# Patient Record
Sex: Male | Born: 1940 | Race: Black or African American | Hispanic: No | Marital: Married | Smoking: Current every day smoker
Health system: Southern US, Community
[De-identification: ages and names within clinical notes are randomized; demographics above are authoritative.]

## PROBLEM LIST (undated history)

## (undated) DIAGNOSIS — I639 Cerebral infarction, unspecified: Secondary | ICD-10-CM

## (undated) DIAGNOSIS — C801 Malignant (primary) neoplasm, unspecified: Secondary | ICD-10-CM

## (undated) HISTORY — PX: HERNIA REPAIR: SHX51

## (undated) HISTORY — DX: Cerebral infarction, unspecified: I63.9

## (undated) HISTORY — PX: GASTRECTOMY: SHX58

---

## 2017-06-27 ENCOUNTER — Emergency Department: Payer: Medicare Other

## 2017-06-27 ENCOUNTER — Emergency Department
Admission: EM | Admit: 2017-06-27 | Discharge: 2017-06-27 | Disposition: A | Discharge status: Home / Self Care | Payer: Medicare Other | Attending: Emergency Medicine | Admitting: Emergency Medicine

## 2017-06-27 ENCOUNTER — Encounter: Payer: Self-pay | Admitting: Emergency Medicine

## 2017-06-27 DIAGNOSIS — Z85038 Personal history of other malignant neoplasm of large intestine: Secondary | ICD-10-CM | POA: Diagnosis not present

## 2017-06-27 DIAGNOSIS — Z8546 Personal history of malignant neoplasm of prostate: Secondary | ICD-10-CM | POA: Diagnosis not present

## 2017-06-27 DIAGNOSIS — I6523 Occlusion and stenosis of bilateral carotid arteries: Secondary | ICD-10-CM | POA: Insufficient documentation

## 2017-06-27 DIAGNOSIS — R42 Dizziness and giddiness: Secondary | ICD-10-CM | POA: Diagnosis present

## 2017-06-27 DIAGNOSIS — F17210 Nicotine dependence, cigarettes, uncomplicated: Secondary | ICD-10-CM | POA: Diagnosis not present

## 2017-06-27 DIAGNOSIS — H81392 Other peripheral vertigo, left ear: Secondary | ICD-10-CM | POA: Insufficient documentation

## 2017-06-27 HISTORY — DX: Malignant (primary) neoplasm, unspecified: C80.1

## 2017-06-27 LAB — BASIC METABOLIC PANEL
Anion gap: 6 (ref 5–15)
BUN: 16 mg/dL (ref 6–20)
CHLORIDE: 108 mmol/L (ref 101–111)
CO2: 28 mmol/L (ref 22–32)
Calcium: 9.4 mg/dL (ref 8.9–10.3)
Creatinine, Ser: 1.14 mg/dL (ref 0.61–1.24)
GFR calc Af Amer: >60 mL/min (ref >60)
GFR calc non Af Amer: >60 mL/min (ref >60)
Glucose, Bld: 129 mg/dL — ABNORMAL HIGH (ref 65–99)
POTASSIUM: 4.1 mmol/L (ref 3.5–5.1)
SODIUM: 142 mmol/L (ref 135–145)

## 2017-06-27 LAB — CBC
HEMATOCRIT: 38.1 % — AB (ref 40.0–52.0)
Hemoglobin: 12.7 g/dL — ABNORMAL LOW (ref 13.0–18.0)
MCH: 33.7 pg (ref 26.0–34.0)
MCHC: 33.4 g/dL (ref 32.0–36.0)
MCV: 101 fL — AB (ref 80.0–100.0)
Platelets: 189 10*3/uL (ref 150–440)
RBC: 3.77 MIL/uL — AB (ref 4.40–5.90)
RDW: 16.9 % — AB (ref 11.5–14.5)
WBC: 7.3 10*3/uL (ref 3.8–10.6)

## 2017-06-27 LAB — TROPONIN I: Troponin I: <0.03 ng/mL (ref <0.03)

## 2017-06-27 MED ORDER — GADOBENATE DIMEGLUMINE 529 MG/ML IV SOLN
20.0000 mL | Freq: Once | INTRAVENOUS | Status: AC | PRN
  Administered 2017-06-27: 20 mL via INTRAVENOUS

## 2017-06-27 MED ORDER — MECLIZINE HCL 25 MG PO TABS
50.0000 mg | ORAL_TABLET | Freq: Once | ORAL | Status: AC
  Administered 2017-06-27: 50 mg via ORAL
  Filled 2017-06-27: qty 2

## 2017-06-27 MED ORDER — MECLIZINE HCL 25 MG PO TABS: 25.0000 mg | ORAL_TABLET | Freq: Three times a day (TID) | ORAL | 0 refills | Status: DC | PRN

## 2017-06-27 NOTE — ED Triage Notes (Signed)
Patient brought un from home by ems. Patient states that he felt dizzy when waking up at 03:00.

## 2017-06-27 NOTE — ED Provider Notes (Signed)
Care signed over from Dr. Owens Shark pending results of MRA. Briefly the patient is a 77 year old man who comes to the emergency department with acute onset severe room spinning vertigo. Worse with movement and improved with rest. The vertigo is fatigable. He has no other neurological symptoms. MRA shows multiple chronic stenoses, however nothing acute or critical. On my exam she has left beating nystagmus and an otherwise normal exam which is all consistent with benign paroxysmal positional vertigo. I will treat him with meclizine and instructions for the Epley maneuver as well as otolaryngology outpatient.   Darel Hong, MD 06/27/17 [phone]

## 2017-06-27 NOTE — ED Notes (Signed)
Pt back from MRI, no complaints of pain. Still co slight dizziness, no deficits noted.

## 2017-06-27 NOTE — Discharge Instructions (Signed)
Fortunately today your MRI was reassuring and you did not have a stroke. You do have peripheral vertigo which is frustrating and can be difficult to treat. Please take your meclizine 3 times a day as needed for vertigo symptoms and performing your Epley maneuver at home 4-5 times a day. Please make an appointment to follow-up with otolaryngology in the next week or so if your symptoms do not improve and return to the emergency department for any concerns.  It was a pleasure to take care of you today, and thank you for coming to our emergency department.  If you have any questions or concerns before leaving please ask the nurse to grab me and I'm more than happy to go through your aftercare instructions again.  If you were prescribed any opioid pain medication today such as Norco, Vicodin, Percocet, morphine, hydrocodone, or oxycodone please make sure you do not drive when you are taking this medication as it can alter your ability to drive safely.  If you have any concerns once you are home that you are not improving or are in fact getting worse before you can make it to your follow-up appointment, please do not hesitate to call 911 and come back for further evaluation.  Darel Hong, MD  Results for orders placed or performed during the hospital encounter of 92/42/68  Basic metabolic panel  Result Value Ref Range   Sodium 142 135 - 145 mmol/L   Potassium 4.1 3.5 - 5.1 mmol/L   Chloride 108 101 - 111 mmol/L   CO2 28 22 - 32 mmol/L   Glucose, Bld 129 (H) 65 - 99 mg/dL   BUN 16 6 - 20 mg/dL   Creatinine, Ser 1.14 0.61 - 1.24 mg/dL   Calcium 9.4 8.9 - 10.3 mg/dL   GFR calc non Af Amer >60 >60 mL/min   GFR calc Af Amer >60 >60 mL/min   Anion gap 6 5 - 15  CBC  Result Value Ref Range   WBC 7.3 3.8 - 10.6 K/uL   RBC 3.77 (L) 4.40 - 5.90 MIL/uL   Hemoglobin 12.7 (L) 13.0 - 18.0 g/dL   HCT 38.1 (L) 40.0 - 52.0 %   MCV 101.0 (H) 80.0 - 100.0 fL   MCH 33.7 26.0 - 34.0 pg   MCHC 33.4 32.0 -  36.0 g/dL   RDW 16.9 (H) 11.5 - 14.5 %   Platelets 189 150 - 440 K/uL  Troponin I  Result Value Ref Range   Troponin I <0.03 <0.03 ng/mL   Ct Head Wo Contrast  Result Date: 06/27/2017 CLINICAL DATA:  Dizziness. EXAM: CT HEAD WITHOUT CONTRAST TECHNIQUE: Contiguous axial images were obtained from the base of the skull through the vertex without intravenous contrast. COMPARISON:  None. FINDINGS: Brain: No evidence of acute infarction, hemorrhage, hydrocephalus, extra-axial collection or mass lesion/mass effect. Mild diffuse cerebral atrophy. Vascular: Vascular calcifications are present in the internal carotid arteries. Skull: No depressed skull fractures. Sinuses/Orbits: Probable retention cysts in the left maxillary antrum. No acute air-fluid levels. Mastoid air cells are not opacified. Other: None. IMPRESSION: No acute intracranial abnormalities.  Chronic atrophy. Electronically Signed   By: Lucienne Capers M.D.   On: 06/27/2017 05:26   Mr Angiogram Head Wo Contrast  Result Date: 06/27/2017 CLINICAL DATA:  Initial evaluation for acute dizziness. EXAM: MRA HEAD WITHOUT CONTRAST MRA NECK WITHOUT AND WITH CONTRAST TECHNIQUE: Multiplanar, multiecho pulse sequences of the brain and surrounding structures were obtained without intravenous contrast. Angiographic images of the  Circle of Willis were obtained using MRA technique without intravenous contrast. Angiographic images of the neck were obtained using MRA technique without and with intravenous contrast. Carotid stenosis measurements (when applicable) are obtained utilizing NASCET criteria, using the distal internal carotid diameter as the denominator. CONTRAST:  14mL MULTIHANCE GADOBENATE DIMEGLUMINE 529 MG/ML IV SOLN COMPARISON:  Prior head CT from earlier the same day. FINDINGS: MRI HEAD FINDINGS MRA HEAD FINDINGS ANTERIOR CIRCULATION: Study degraded by motion artifact. Visualized distal cervical segments of the internal carotid arteries are patent with  antegrade flow. Petrous segments patent bilaterally without stenosis. Scattered atheromatous irregularity within the cavernous/supraclinoid ICAs without flow-limiting stenosis. ICA termini widely patent. Probable atheromatous irregularity within the A1 segments bilaterally without high-grade stenosis. Right A1 segment hypoplastic. Left A1 segment dominant. Anterior communicating artery grossly normal. Anterior cerebral artery is grossly patent to their distal aspects without obvious stenosis. M1 segments patent without occlusion or obvious flow-limiting stenosis. No proximal M2 occlusion. Distal MCA branches limited evaluation due to motion, but are well opacified and grossly symmetric. POSTERIOR CIRCULATION: Dominant left vertebral artery with hypoplastic right vertebral artery. Vertebral artery is are patent to the vertebrobasilar junction without obvious stenosis. Partially visualized posterior inferior cerebral arteries patent. Basilar artery diminutive but patent to its distal aspect. Superior cerebral arteries patent bilaterally. Predominant fetal type PCAs supplied via widely patent posterior communicating arteries. PCAs are widely patent through their mid aspects, not well evaluated distally due to motion. No obvious aneurysm or vascular abnormality. MRA NECK FINDINGS Source images reviewed. Origin of the vertebral arteries not visualized on this exam. Partially visualized subclavian arteries widely patent. Right common carotid artery widely patent from its origin to the bifurcation. Mild atheromatous narrowing of approximately 25-30% present about the right carotid bifurcation. Right ICA patent distally to the skullbase without stenosis or occlusion. Visualized left common carotid artery widely patent to the bifurcation. Atheromatous irregularity with moderate stenosis at the left carotid bifurcation/ proximal left ICA. Superimposed 2 adjacent focal outpouchings extending from the proximal left ICA likely  reflect an known penetrating atheromatous ulcers (series 12, image 46). Left ICA patent distally without stenosis or occlusion. Vertebral arteries not well evaluated on this exam due to motion and timing. Left vertebral artery dominant and grossly patent to the skullbase. Right vertebral artery diffusely hypoplastic and not well visualized. IMPRESSION: MRA HEAD IMPRESSION: 1. Motion degraded exam. 2. Negative MRA for large vessel occlusion. No obvious high-grade or correctable stenosis. 3. Fetal type origin of the PCAs with diminutive vertebrobasilar system. MRA NECK IMPRESSION: 1. Motion degraded exam with poor evaluation of the vertebral artery's. Dominant left vertebral artery grossly patent within the neck without obvious stenosis. Right vertebral artery diffusely hypoplastic and not well evaluated. 2. 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 Follow-up examination with dedicated CTA would likely be helpful for complete evaluation of this finding. Otherwise patent left carotid artery system. 3. Mild atheromatous stenosis about the right carotid bifurcation/proximal right ICA. Otherwise widely patent right carotid artery system. Electronically Signed   By: Jeannine Boga M.D.   On: 06/27/2017 06:59   Mr Angiogram Neck W Or Wo Contrast  Result Date: 06/27/2017 CLINICAL DATA:  Initial evaluation for acute dizziness. EXAM: MRA HEAD WITHOUT CONTRAST MRA NECK WITHOUT AND WITH CONTRAST TECHNIQUE: Multiplanar, multiecho pulse sequences of the brain and surrounding structures were obtained without intravenous contrast. Angiographic images of the Circle of Willis were obtained using MRA technique without intravenous  contrast. Angiographic images of the neck were obtained using MRA technique without and with intravenous contrast. Carotid stenosis measurements (when applicable) are obtained utilizing NASCET  criteria, using the distal internal carotid diameter as the denominator. CONTRAST:  29mL MULTIHANCE GADOBENATE DIMEGLUMINE 529 MG/ML IV SOLN COMPARISON:  Prior head CT from earlier the same day. FINDINGS: MRI HEAD FINDINGS MRA HEAD FINDINGS ANTERIOR CIRCULATION: Study degraded by motion artifact. Visualized distal cervical segments of the internal carotid arteries are patent with antegrade flow. Petrous segments patent bilaterally without stenosis. Scattered atheromatous irregularity within the cavernous/supraclinoid ICAs without flow-limiting stenosis. ICA termini widely patent. Probable atheromatous irregularity within the A1 segments bilaterally without high-grade stenosis. Right A1 segment hypoplastic. Left A1 segment dominant. Anterior communicating artery grossly normal. Anterior cerebral artery is grossly patent to their distal aspects without obvious stenosis. M1 segments patent without occlusion or obvious flow-limiting stenosis. No proximal M2 occlusion. Distal MCA branches limited evaluation due to motion, but are well opacified and grossly symmetric. POSTERIOR CIRCULATION: Dominant left vertebral artery with hypoplastic right vertebral artery. Vertebral artery is are patent to the vertebrobasilar junction without obvious stenosis. Partially visualized posterior inferior cerebral arteries patent. Basilar artery diminutive but patent to its distal aspect. Superior cerebral arteries patent bilaterally. Predominant fetal type PCAs supplied via widely patent posterior communicating arteries. PCAs are widely patent through their mid aspects, not well evaluated distally due to motion. No obvious aneurysm or vascular abnormality. MRA NECK FINDINGS Source images reviewed. Origin of the vertebral arteries not visualized on this exam. Partially visualized subclavian arteries widely patent. Right common carotid artery widely patent from its origin to the bifurcation. Mild atheromatous narrowing of approximately  25-30% present about the right carotid bifurcation. Right ICA patent distally to the skullbase without stenosis or occlusion. Visualized left common carotid artery widely patent to the bifurcation. Atheromatous irregularity with moderate stenosis at the left carotid bifurcation/ proximal left ICA. 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 Left ICA patent distally without stenosis or occlusion. Vertebral arteries not well evaluated on this exam due to motion and timing. Left vertebral artery dominant and grossly patent to the skullbase. Right vertebral artery diffusely hypoplastic and not well visualized. IMPRESSION: MRA HEAD IMPRESSION: 1. Motion degraded exam. 2. Negative MRA for large vessel occlusion. No obvious high-grade or correctable stenosis. 3. Fetal type origin of the PCAs with diminutive vertebrobasilar system. MRA NECK IMPRESSION: 1. Motion degraded exam with poor evaluation of the vertebral artery's. Dominant left vertebral artery grossly patent within the neck without obvious stenosis. Right vertebral artery diffusely hypoplastic and not well evaluated. 2. 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 Follow-up examination with dedicated CTA would likely be helpful for complete evaluation of this finding. Otherwise patent left carotid artery system. 3. Mild atheromatous stenosis about the right carotid bifurcation/proximal right ICA. Otherwise widely patent right carotid artery system. Electronically Signed   By: Jeannine Boga M.D.   On: 06/27/2017 06:59

## 2017-06-27 NOTE — ED Notes (Signed)
Patient transported to CT 

## 2017-06-27 NOTE — ED Notes (Signed)
Patient transported to MRI 

## 2017-06-27 NOTE — ED Provider Notes (Signed)
[facility] Emergency Department Provider Note   First MD Initiated Contact with Patient 06/27/17 [phone]     (approximate)  I have reviewed the triage vital signs and the nursing notes.   HISTORY  Chief Complaint Dizziness    HPI Bradley Rivera is a 76 y.o. male with below list of chronic medical conditions presents to the emergency department with acute onset of dizziness on awakening this morning. Patient denies any headache no weakness numbness. Patient missed some nausea but no vomiting. Patient denies any chest pain or shortness of breath. Patient denies ever having symptoms like this before.  Past Medical History:  Diagnosis Date  . 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   Past Surgical History:  Procedure Laterality Date  . GASTRECTOMY    . HERNIA REPAIR      Prior to Admission medications   Not on File    Allergies Asa [aspirin]  No family history on file.  Social History Social History  Substance Use Topics  . Smoking status: Current Some Day Smoker  . Smokeless tobacco: Never Used  . Alcohol use Not on file    Review of Systems Constitutional: No fever/chills Eyes: No visual changes. ENT: No sore throat. Cardiovascular: Denies chest pain. Respiratory: Denies shortness of breath. Gastrointestinal: No abdominal pain.  No nausea, no vomiting.  No diarrhea.  No constipation. Genitourinary: Negative for dysuria. Musculoskeletal: Negative for neck pain.  Negative for back pain. Integumentary: Negative for rash. Neurological: Negative for headaches, focal weakness or numbness.Positive for dizziness ____________________________________________   PHYSICAL EXAM:  VITAL SIGNS: ED Triage Vitals [06/27/17 0407]  Enc Vitals Group     BP (!) 173/66     Pulse Rate 62     Resp 18     Temp 97.8 F (36.6 C)     Temp Source Oral     SpO2 99 %     Weight 77.1 kg  (170 lb)     Height 1.854 m (6\' 1" )     Head Circumference      Peak Flow      Pain Score      Pain Loc      Pain Edu?      Excl. in [place]?      Constitutional: Alert and oriented. Well appearing and in no acute distress. Eyes: Conjunctivae are normal. PERRL. EOMI. Head: Atraumatic. Ears:  Healthy appearing ear canals and TMs bilaterally Nose: No congestion/rhinnorhea. Mouth/Throat: Mucous membranes are moist  Oropharynx non-erythematous. Neck: No stridor.   Cardiovascular: Normal rate, regular rhythm. Good peripheral circulation. Grossly normal heart sounds. Respiratory: Normal respiratory effort.  No retractions. Lungs CTAB. Gastrointestinal: Soft and nontender. No distention.  Musculoskeletal: No lower extremity tenderness nor edema. No gross deformities of extremities. Neurologic:  Normal speech and language. No gross focal neurologic deficits are appreciated.  Skin:  Skin is warm, dry and intact. No rash noted. Psychiatric: Mood and affect are normal. Speech and behavior are normal.  ____________________________________________   LABS (all labs ordered are listed, but only abnormal results are displayed)  Labs Reviewed  BASIC METABOLIC PANEL - Abnormal; Notable for the following:       Result Value   Glucose, Bld 129 (*)    All other components within normal limits  CBC - Abnormal; Notable for the following:    RBC 3.77 (*)    Hemoglobin 12.7 (*)  HCT 38.1 (*)    MCV 101.0 (*)    RDW 16.9 (*)    All other components within normal limits  TROPONIN I   ____________________________________________  EKG  ED ECG REPORT I, [place] [address], the attending physician, personally viewed and interpreted this ECG.   Date: 06/27/2017  EKG Time: 4:05 AM  Rate: 60  Rhythm: Normal sinus rhythm  Axis: Normal  Intervals: Normal  ST&T Change: None  ____________________________________________  RADIOLOGY I, [place], personally viewed and evaluated these images  (plain radiographs) as part of my medical decision making, as well as reviewing the written report by the radiologist.  Ct Head Wo Contrast  Result Date: 06/27/2017 CLINICAL DATA:  Dizziness. EXAM: CT HEAD WITHOUT CONTRAST TECHNIQUE: Contiguous axial images were obtained from the base of the skull through the vertex without intravenous contrast. COMPARISON:  None. FINDINGS: Brain: No evidence of acute infarction, hemorrhage, hydrocephalus, extra-axial collection or mass lesion/mass effect. Mild diffuse cerebral atrophy. Vascular: Vascular calcifications are present in the internal carotid arteries. Skull: No depressed skull fractures. Sinuses/Orbits: Probable retention cysts in the left maxillary antrum. No acute air-fluid levels. Mastoid air cells are not opacified. Other: None. IMPRESSION: No acute intracranial abnormalities.  Chronic atrophy. Electronically Signed   By: Lucienne Capers M.D.   On: 06/27/2017 05:26     Procedures   ____________________________________________   INITIAL IMPRESSION / ASSESSMENT AND PLAN / ED COURSE  Pertinent labs & imaging results that were available during my care of the patient were reviewed by me and considered in my medical decision making (see chart for details).  76 year old male presenting in the emergency department with acute onset of dizziness on awakening this morning. CT scan of the head revealed no acute intracranial abnormalities laboratory data unremarkable. Plan to perform an MRI of the patient's brain and neck to evaluate for posterior circulation pathology, aneurysm or CVA. If MRI negative I  anticipate that the patient will be able to be discharged home with outpatient follow-up. Patient's care transferred to Dr. Mable Paris      ____________________________________________  FINAL CLINICAL IMPRESSION(S) / ED DIAGNOSES  Final diagnoses:  Dizziness     MEDICATIONS GIVEN DURING THIS VISIT:  Medications  gadobenate dimeglumine  (MULTIHANCE) injection 20 mL (not administered)     NEW OUTPATIENT MEDICATIONS STARTED DURING THIS VISIT:  New Prescriptions   No medications on file    Modified Medications   No medications on file    Discontinued Medications   No medications on file     Note:  This document was prepared using Dragon voice recognition software and may include unintentional dictation errors.    Gregor Hams, MD 06/27/17 [phone]

## 2017-06-27 NOTE — ED Notes (Signed)
ED Provider at bedside. 

## 2017-07-16 ENCOUNTER — Telehealth: Payer: Self-pay | Admitting: Emergency Medicine

## 2017-07-16 NOTE — Telephone Encounter (Signed)
Patient called asking about follow up appointment.  I explained that we did not make an appoinment, but that it was ent he needed to follow up with if he was not better.  Patient says he is better. No longer taking the meclizine.  He has pcp at [place].  I told him to call the pcp and let them know about ED visit, as he had many tests done that they would want access to.  I also told him to ask if he really needed to go to ent since he is better. He agrees.

## 2019-03-28 ENCOUNTER — Ambulatory Visit: Payer: Self-pay | Admitting: Internal Medicine

## 2019-03-28 ENCOUNTER — Other Ambulatory Visit: Payer: Self-pay

## 2019-05-02 ENCOUNTER — Other Ambulatory Visit: Payer: Self-pay

## 2019-05-02 ENCOUNTER — Encounter: Payer: Self-pay | Admitting: Emergency Medicine

## 2019-05-02 ENCOUNTER — Emergency Department
Admission: EM | Admit: 2019-05-02 | Discharge: 2019-05-02 | Disposition: A | Discharge status: Home / Self Care | Payer: Medicare HMO | Attending: Emergency Medicine | Admitting: Emergency Medicine

## 2019-05-02 DIAGNOSIS — F17200 Nicotine dependence, unspecified, uncomplicated: Secondary | ICD-10-CM | POA: Insufficient documentation

## 2019-05-02 DIAGNOSIS — Z85038 Personal history of other malignant neoplasm of large intestine: Secondary | ICD-10-CM | POA: Diagnosis not present

## 2019-05-02 DIAGNOSIS — R42 Dizziness and giddiness: Secondary | ICD-10-CM | POA: Diagnosis present

## 2019-05-02 DIAGNOSIS — Z8546 Personal history of malignant neoplasm of prostate: Secondary | ICD-10-CM | POA: Diagnosis not present

## 2019-05-02 DIAGNOSIS — E860 Dehydration: Secondary | ICD-10-CM | POA: Diagnosis not present

## 2019-05-02 DIAGNOSIS — Z85028 Personal history of other malignant neoplasm of stomach: Secondary | ICD-10-CM | POA: Diagnosis not present

## 2019-05-02 DIAGNOSIS — E162 Hypoglycemia, unspecified: Secondary | ICD-10-CM | POA: Insufficient documentation

## 2019-05-02 LAB — CBC WITH DIFFERENTIAL/PLATELET
Abs Immature Granulocytes: 0 10*3/uL (ref 0.00–0.07)
Basophils Absolute: 0.1 10*3/uL (ref 0.0–0.1)
Basophils Relative: 1 %
Eosinophils Absolute: 0.1 10*3/uL (ref 0.0–0.5)
Eosinophils Relative: 2 %
HCT: 32 % — ABNORMAL LOW (ref 39.0–52.0)
Hemoglobin: 10.5 g/dL — ABNORMAL LOW (ref 13.0–17.0)
Immature Granulocytes: 0 %
Lymphocytes Relative: 41 %
Lymphs Abs: 2.9 10*3/uL (ref 0.7–4.0)
MCH: 32.4 pg (ref 26.0–34.0)
MCHC: 32.8 g/dL (ref 30.0–36.0)
MCV: 98.8 fL (ref 80.0–100.0)
Monocytes Absolute: 1 10*3/uL (ref 0.1–1.0)
Monocytes Relative: 14 %
Neutro Abs: 3 10*3/uL (ref 1.7–7.7)
Neutrophils Relative %: 42 %
Platelets: 196 10*3/uL (ref 150–400)
RBC: 3.24 MIL/uL — ABNORMAL LOW (ref 4.22–5.81)
RDW: 15.9 % — ABNORMAL HIGH (ref 11.5–15.5)
WBC: 7.1 10*3/uL (ref 4.0–10.5)
nRBC: 0 % (ref 0.0–0.2)

## 2019-05-02 LAB — GLUCOSE, CAPILLARY
Glucose-Capillary: 48 mg/dL — ABNORMAL LOW (ref 70–99)
Glucose-Capillary: 176 mg/dL — ABNORMAL HIGH (ref 70–99)
Glucose-Capillary: 199 mg/dL — ABNORMAL HIGH (ref 70–99)

## 2019-05-02 LAB — BASIC METABOLIC PANEL
Anion gap: 8 (ref 5–15)
BUN: 23 mg/dL (ref 8–23)
CO2: 21 mmol/L — ABNORMAL LOW (ref 22–32)
Calcium: 8.8 mg/dL — ABNORMAL LOW (ref 8.9–10.3)
Chloride: 110 mmol/L (ref 98–111)
Creatinine, Ser: 1.39 mg/dL — ABNORMAL HIGH (ref 0.61–1.24)
GFR calc Af Amer: 56 mL/min — ABNORMAL LOW (ref >60)
GFR calc non Af Amer: 48 mL/min — ABNORMAL LOW (ref >60)
Glucose, Bld: 49 mg/dL — ABNORMAL LOW (ref 70–99)
Potassium: 3.4 mmol/L — ABNORMAL LOW (ref 3.5–5.1)
Sodium: 139 mmol/L (ref 135–145)

## 2019-05-02 LAB — TROPONIN I: Troponin I: <0.03 ng/mL (ref <0.03)

## 2019-05-02 MED ORDER — DEXTROSE 50 % IV SOLN
INTRAVENOUS | Status: AC
  Administered 2019-05-02: 16:00:00 50 mL
  Filled 2019-05-02: qty 50

## 2019-05-02 NOTE — ED Triage Notes (Signed)
Pt here after working in yard this AM without drinking much per report. Went in house and started feeling dizzy and weak all over.  98 systolic per EMS

## 2019-05-02 NOTE — Discharge Instructions (Addendum)
Please seek medical attention for any high fevers, chest pain, shortness of breath, change in behavior, persistent vomiting, bloody stool or any other new or concerning symptoms.  

## 2019-05-02 NOTE — ED Provider Notes (Signed)
[facility] Emergency Department Provider Note   ____________________________________________   I have reviewed the triage vital signs and the nursing notes.   HISTORY  Chief Complaint Dizziness   History limited by: Not Limited   HPI Bradley Rivera is a 78 y.o. male who presents to the emergency department today after episode of feeling dizzy.  Patient states that he had been outside working for most of the day.  He does not think he drank or ate as much as he should have.  He denies any concurrent chest pain or palpitations with the dizziness.  Denies any nausea or vomiting recently.  Denies any fevers.  By the time my exam he had gotten some dextrose and stated that he was feeling better.    Records reviewed. Per medical record review patient has a history of cancer, gastrectomy  Past Medical History:  Diagnosis Date  . 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   Past Surgical History:  Procedure Laterality Date  . GASTRECTOMY    . HERNIA REPAIR      Prior to Admission medications   Not on File    Allergies Patient has no known allergies.  History reviewed. No pertinent family history.  Social History Social History   Tobacco Use  . Smoking status: Current Some Day Smoker  . Smokeless tobacco: Never Used  Substance Use Topics  . Alcohol use: Not on file  . Drug use: Not on file    Review of Systems Constitutional: No fever/chills Eyes: No visual changes. ENT: No sore throat. Cardiovascular: Denies chest pain. Respiratory: Denies shortness of breath. Gastrointestinal: No abdominal pain.  No nausea, no vomiting.  No diarrhea.   Genitourinary: Negative for dysuria. Musculoskeletal: Negative for back pain. Skin: Negative for rash. Neurological: Positive for dizziness ____________________________________________   PHYSICAL EXAM:  VITAL SIGNS: ED Triage  Vitals  Enc Vitals Group     BP 05/02/19 1553 (!) 111/56     Pulse Rate 05/02/19 1553 69     Resp 05/02/19 1553 17     Temp 05/02/19 1553 97.8 F (36.6 C)     Temp Source 05/02/19 1553 Oral     SpO2 05/02/19 1553 95 %     Weight 05/02/19 1549 160 lb (72.6 kg)     Height 05/02/19 1549 6\' 1"  (1.854 m)     Head Circumference --      Peak Flow --      Pain Score 05/02/19 1549 0   Constitutional: Alert and oriented.  Eyes: Conjunctivae are normal.  ENT      Head: Normocephalic and atraumatic.      Nose: No congestion/rhinnorhea.      Mouth/Throat: Mucous membranes are moist.      Neck: No stridor. Hematological/Lymphatic/Immunilogical: No cervical lymphadenopathy. Cardiovascular: Normal rate, regular rhythm.  No murmurs, rubs, or gallops.  Respiratory: Normal respiratory effort without tachypnea nor retractions. Breath sounds are clear and equal bilaterally. No wheezes/rales/rhonchi. Gastrointestinal: Soft and non tender. No rebound. No guarding.  Genitourinary: Deferred Musculoskeletal: Normal range of motion in all extremities. No lower extremity edema. Neurologic:  Normal speech and language. No gross focal neurologic deficits are appreciated.  Skin:  Skin is warm, dry and intact. No rash noted. Psychiatric: Mood and affect are normal. Speech and behavior are normal. Patient exhibits appropriate insight and judgment.  ____________________________________________    LABS (pertinent positives/negatives)  Trop <0.03 BMP  na 139, k 3.4, glu 49, cr 1.39 CBC wbc 7.1, hgb 10.5, plt 196  ____________________________________________   EKG  I, Nance Pear, attending physician, personally viewed and interpreted this EKG  EKG Time: 1554 Rate: 67 Rhythm: sinus rhythm Axis: normal Intervals: qtc 415 QRS: narrow, q waves v1 ST changes: no st elevation Impression: abnormal ekg   ____________________________________________     RADIOLOGY  None  ____________________________________________   PROCEDURES  Procedures  ____________________________________________   INITIAL IMPRESSION / ASSESSMENT AND PLAN / ED COURSE  Pertinent labs & imaging results that were available during my care of the patient were reviewed by me and considered in my medical decision making (see chart for details).   Patient presented to the emergency department today because of concerns for dizziness.  He had been outside working today.  Patient was found to be hypoglycemic upon arrival.  Was given dextrose and IV fluids.  He did improve significantly after treatment.  Blood work without concerning findings.  Given that the patient feels much improved do think likely secondary to dehydration and hypoglycemia.  Discussed this with the patient.  Discussed importance of hydration. ____________________________________________   FINAL CLINICAL IMPRESSION(S) / ED DIAGNOSES  Final diagnoses:  Dizziness  Dehydration  Hypoglycemia     Note: This dictation was prepared with Dragon dictation. Any transcriptional errors that result from this process are unintentional     Nance Pear, MD 05/02/19 [phone]

## 2019-05-02 NOTE — ED Notes (Signed)
RN called [facility] [employer] to transport patient home. Taxi service says they are picking up a patient to transport and then will be back to get patient. Patient wishes to wait outside, this RN gave patient some OJ, peanut butter, and crackers just in case he gets dizzy on way home. Patient insists on waiting outside for taxi

## 2019-05-02 NOTE — ED Notes (Signed)
Patient states he is feeling much better.

## 2020-08-05 ENCOUNTER — Encounter: Payer: Self-pay | Admitting: Emergency Medicine

## 2020-08-05 ENCOUNTER — Other Ambulatory Visit: Payer: Self-pay

## 2020-08-05 ENCOUNTER — Emergency Department
Admission: EM | Admit: 2020-08-05 | Discharge: 2020-08-05 | Disposition: A | Discharge status: Home / Self Care | Payer: Medicare PPO | Attending: Emergency Medicine | Admitting: Emergency Medicine

## 2020-08-05 DIAGNOSIS — Z5321 Procedure and treatment not carried out due to patient leaving prior to being seen by health care provider: Secondary | ICD-10-CM | POA: Insufficient documentation

## 2020-08-05 DIAGNOSIS — R1013 Epigastric pain: Secondary | ICD-10-CM | POA: Insufficient documentation

## 2020-08-05 NOTE — ED Notes (Signed)
Pt refusing all protocols at this time; requesting to get EMS to take him back home due to long wait and lack of symptoms at this time; explained to pt EMS does not provide transportation home that they are used for emergencies to bring pts to the hospital; pt taken to lobby to wait for exam room

## 2020-08-05 NOTE — ED Triage Notes (Signed)
Pt to triage via w/c with no distress noted; EMS brought pt in from home for sudden onset mid epigastric pain; pt denies any c/o at present

## 2021-01-12 ENCOUNTER — Emergency Department: Payer: Medicare PPO

## 2021-01-12 ENCOUNTER — Inpatient Hospital Stay
Admission: EM | Admit: 2021-01-12 | Discharge: 2021-01-15 | DRG: 063 | Disposition: A | Discharge status: Home Health | Payer: Medicare PPO | Attending: Internal Medicine | Admitting: Internal Medicine

## 2021-01-12 ENCOUNTER — Other Ambulatory Visit: Payer: Self-pay

## 2021-01-12 DIAGNOSIS — I639 Cerebral infarction, unspecified: Secondary | ICD-10-CM | POA: Diagnosis present

## 2021-01-12 DIAGNOSIS — R54 Age-related physical debility: Secondary | ICD-10-CM | POA: Diagnosis present

## 2021-01-12 DIAGNOSIS — R471 Dysarthria and anarthria: Secondary | ICD-10-CM | POA: Diagnosis present

## 2021-01-12 DIAGNOSIS — Z20822 Contact with and (suspected) exposure to covid-19: Secondary | ICD-10-CM | POA: Diagnosis present

## 2021-01-12 DIAGNOSIS — R29704 NIHSS score 4: Secondary | ICD-10-CM | POA: Diagnosis present

## 2021-01-12 DIAGNOSIS — Z79899 Other long term (current) drug therapy: Secondary | ICD-10-CM

## 2021-01-12 DIAGNOSIS — N400 Enlarged prostate without lower urinary tract symptoms: Secondary | ICD-10-CM | POA: Diagnosis present

## 2021-01-12 DIAGNOSIS — F17200 Nicotine dependence, unspecified, uncomplicated: Secondary | ICD-10-CM | POA: Diagnosis present

## 2021-01-12 DIAGNOSIS — R42 Dizziness and giddiness: Secondary | ICD-10-CM | POA: Diagnosis present

## 2021-01-12 DIAGNOSIS — G8321 Monoplegia of upper limb affecting right dominant side: Secondary | ICD-10-CM | POA: Diagnosis present

## 2021-01-12 DIAGNOSIS — R29810 Facial weakness: Secondary | ICD-10-CM | POA: Diagnosis present

## 2021-01-12 DIAGNOSIS — R4781 Slurred speech: Secondary | ICD-10-CM | POA: Diagnosis present

## 2021-01-12 DIAGNOSIS — Z8546 Personal history of malignant neoplasm of prostate: Secondary | ICD-10-CM

## 2021-01-12 DIAGNOSIS — D649 Anemia, unspecified: Secondary | ICD-10-CM | POA: Diagnosis present

## 2021-01-12 DIAGNOSIS — I63432 Cerebral infarction due to embolism of left posterior cerebral artery: Principal | ICD-10-CM | POA: Diagnosis present

## 2021-01-12 NOTE — ED Triage Notes (Signed)
2230 onset dizziness and "discombobulation." presents with slight R sided weakness in upper extremity, slurred speech, and R sided facial droop.

## 2021-01-12 NOTE — ED Notes (Signed)
Teleneuro on camera in room conducting assessment

## 2021-01-13 ENCOUNTER — Inpatient Hospital Stay: Payer: Medicare PPO

## 2021-01-13 ENCOUNTER — Emergency Department: Payer: Medicare PPO

## 2021-01-13 ENCOUNTER — Inpatient Hospital Stay
Admit: 2021-01-13 | Discharge: 2021-01-13 | Disposition: A | Discharge status: Home / Self Care | Payer: Medicare PPO | Attending: Critical Care Medicine | Admitting: Critical Care Medicine

## 2021-01-13 DIAGNOSIS — N400 Enlarged prostate without lower urinary tract symptoms: Secondary | ICD-10-CM | POA: Diagnosis present

## 2021-01-13 DIAGNOSIS — I63432 Cerebral infarction due to embolism of left posterior cerebral artery: Secondary | ICD-10-CM | POA: Diagnosis present

## 2021-01-13 DIAGNOSIS — R471 Dysarthria and anarthria: Secondary | ICD-10-CM | POA: Diagnosis present

## 2021-01-13 DIAGNOSIS — R29810 Facial weakness: Secondary | ICD-10-CM | POA: Diagnosis present

## 2021-01-13 DIAGNOSIS — Z79899 Other long term (current) drug therapy: Secondary | ICD-10-CM | POA: Diagnosis not present

## 2021-01-13 DIAGNOSIS — Z9282 Status post administration of tPA (rtPA) in a different facility within the last 24 hours prior to admission to current facility: Secondary | ICD-10-CM | POA: Diagnosis not present

## 2021-01-13 DIAGNOSIS — Z8546 Personal history of malignant neoplasm of prostate: Secondary | ICD-10-CM | POA: Diagnosis not present

## 2021-01-13 DIAGNOSIS — R29704 NIHSS score 4: Secondary | ICD-10-CM | POA: Diagnosis present

## 2021-01-13 DIAGNOSIS — F17200 Nicotine dependence, unspecified, uncomplicated: Secondary | ICD-10-CM | POA: Diagnosis present

## 2021-01-13 DIAGNOSIS — R4781 Slurred speech: Secondary | ICD-10-CM | POA: Diagnosis present

## 2021-01-13 DIAGNOSIS — Z87438 Personal history of other diseases of male genital organs: Secondary | ICD-10-CM | POA: Diagnosis not present

## 2021-01-13 DIAGNOSIS — Z20822 Contact with and (suspected) exposure to covid-19: Secondary | ICD-10-CM | POA: Diagnosis present

## 2021-01-13 DIAGNOSIS — I639 Cerebral infarction, unspecified: Secondary | ICD-10-CM | POA: Diagnosis present

## 2021-01-13 DIAGNOSIS — R54 Age-related physical debility: Secondary | ICD-10-CM | POA: Diagnosis present

## 2021-01-13 DIAGNOSIS — G8321 Monoplegia of upper limb affecting right dominant side: Secondary | ICD-10-CM | POA: Diagnosis present

## 2021-01-13 DIAGNOSIS — D649 Anemia, unspecified: Secondary | ICD-10-CM | POA: Diagnosis present

## 2021-01-13 DIAGNOSIS — R42 Dizziness and giddiness: Secondary | ICD-10-CM | POA: Diagnosis present

## 2021-01-13 LAB — TROPONIN I (HIGH SENSITIVITY)
Troponin I (High Sensitivity): 11 ng/L (ref <18)
Troponin I (High Sensitivity): 11 ng/L (ref <18)

## 2021-01-13 LAB — BASIC METABOLIC PANEL
Anion gap: 9 (ref 5–15)
BUN: 14 mg/dL (ref 8–23)
CO2: 27 mmol/L (ref 22–32)
Calcium: 8.9 mg/dL (ref 8.9–10.3)
Chloride: 103 mmol/L (ref 98–111)
Creatinine, Ser: 1 mg/dL (ref 0.61–1.24)
GFR, Estimated: >60 mL/min (ref >60)
Glucose, Bld: 93 mg/dL (ref 70–99)
Potassium: 4.2 mmol/L (ref 3.5–5.1)
Sodium: 139 mmol/L (ref 135–145)

## 2021-01-13 LAB — CBC
HCT: 35.5 % — ABNORMAL LOW (ref 39.0–52.0)
HCT: 35.9 % — ABNORMAL LOW (ref 39.0–52.0)
Hemoglobin: 11.6 g/dL — ABNORMAL LOW (ref 13.0–17.0)
Hemoglobin: 11.9 g/dL — ABNORMAL LOW (ref 13.0–17.0)
MCH: 32.6 pg (ref 26.0–34.0)
MCH: 33.1 pg (ref 26.0–34.0)
MCHC: 32.3 g/dL (ref 30.0–36.0)
MCHC: 33.5 g/dL (ref 30.0–36.0)
MCV: 100.8 fL — ABNORMAL HIGH (ref 80.0–100.0)
MCV: 98.6 fL (ref 80.0–100.0)
Platelets: 164 10*3/uL (ref 150–400)
Platelets: 167 10*3/uL (ref 150–400)
RBC: 3.56 MIL/uL — ABNORMAL LOW (ref 4.22–5.81)
RBC: 3.6 MIL/uL — ABNORMAL LOW (ref 4.22–5.81)
RDW: 14.1 % (ref 11.5–15.5)
RDW: 14.5 % (ref 11.5–15.5)
WBC: 7.6 10*3/uL (ref 4.0–10.5)
WBC: 7.7 10*3/uL (ref 4.0–10.5)
nRBC: 0 % (ref 0.0–0.2)
nRBC: 0 % (ref 0.0–0.2)

## 2021-01-13 LAB — DIFFERENTIAL
Abs Immature Granulocytes: 0.01 10*3/uL (ref 0.00–0.07)
Basophils Absolute: 0 10*3/uL (ref 0.0–0.1)
Basophils Relative: 0 %
Eosinophils Absolute: 0.1 10*3/uL (ref 0.0–0.5)
Eosinophils Relative: 2 %
Immature Granulocytes: 0 %
Lymphocytes Relative: 27 %
Lymphs Abs: 2.1 10*3/uL (ref 0.7–4.0)
Monocytes Absolute: 0.9 10*3/uL (ref 0.1–1.0)
Monocytes Relative: 12 %
Neutro Abs: 4.4 10*3/uL (ref 1.7–7.7)
Neutrophils Relative %: 59 %

## 2021-01-13 LAB — COMPREHENSIVE METABOLIC PANEL
ALT: 17 U/L (ref 0–44)
AST: 25 U/L (ref 15–41)
Albumin: 3.4 g/dL — ABNORMAL LOW (ref 3.5–5.0)
Alkaline Phosphatase: 70 U/L (ref 38–126)
Anion gap: 7 (ref 5–15)
BUN: 14 mg/dL (ref 8–23)
CO2: 26 mmol/L (ref 22–32)
Calcium: 9 mg/dL (ref 8.9–10.3)
Chloride: 107 mmol/L (ref 98–111)
Creatinine, Ser: 1.06 mg/dL (ref 0.61–1.24)
GFR, Estimated: >60 mL/min (ref >60)
Glucose, Bld: 101 mg/dL — ABNORMAL HIGH (ref 70–99)
Potassium: 4.2 mmol/L (ref 3.5–5.1)
Sodium: 140 mmol/L (ref 135–145)
Total Bilirubin: 0.5 mg/dL (ref 0.3–1.2)
Total Protein: 7 g/dL (ref 6.5–8.1)

## 2021-01-13 LAB — LIPID PANEL
Cholesterol: 140 mg/dL (ref 0–200)
HDL: 61 mg/dL (ref >40)
LDL Cholesterol: 70 mg/dL (ref 0–99)
Total CHOL/HDL Ratio: 2.3 RATIO
Triglycerides: 47 mg/dL (ref <150)
VLDL: 9 mg/dL (ref 0–40)

## 2021-01-13 LAB — RESP PANEL BY RT-PCR (FLU A&B, COVID) ARPGX2
Influenza A by PCR: NEGATIVE
Influenza B by PCR: NEGATIVE
SARS Coronavirus 2 by RT PCR: NEGATIVE

## 2021-01-13 LAB — MRSA PCR SCREENING: MRSA by PCR: NEGATIVE

## 2021-01-13 LAB — ECHOCARDIOGRAM COMPLETE
Height: 73 in
Weight: 2377.44 oz

## 2021-01-13 LAB — GLUCOSE, CAPILLARY: Glucose-Capillary: 90 mg/dL (ref 70–99)

## 2021-01-13 LAB — PHOSPHORUS: Phosphorus: 3.3 mg/dL (ref 2.5–4.6)

## 2021-01-13 LAB — PROTIME-INR
INR: 1 (ref 0.8–1.2)
Prothrombin Time: 13.1 seconds (ref 11.4–15.2)

## 2021-01-13 LAB — APTT: aPTT: 39 seconds — ABNORMAL HIGH (ref 24–36)

## 2021-01-13 LAB — ETHANOL: Alcohol, Ethyl (B): <10 mg/dL (ref <10)

## 2021-01-13 LAB — MAGNESIUM: Magnesium: 1.8 mg/dL (ref 1.7–2.4)

## 2021-01-13 MED ORDER — ATORVASTATIN CALCIUM 20 MG PO TABS
40.0000 mg | ORAL_TABLET | Freq: Every day | ORAL | Status: DC
  Administered 2021-01-14 – 2021-01-15 (×2): 40 mg via ORAL
  Filled 2021-01-13 (×2): qty 2

## 2021-01-13 MED ORDER — POLYETHYLENE GLYCOL 3350 17 G PO PACK: 17.0000 g | PACK | Freq: Every day | ORAL | Status: DC | PRN

## 2021-01-13 MED ORDER — SODIUM CHLORIDE 0.9 % IV SOLN
50.0000 mL | Freq: Once | INTRAVENOUS | Status: AC
  Administered 2021-01-13: 50 mL via INTRAVENOUS

## 2021-01-13 MED ORDER — IOHEXOL 350 MG/ML SOLN
100.0000 mL | Freq: Once | INTRAVENOUS | Status: AC | PRN
  Administered 2021-01-13: 100 mL via INTRAVENOUS

## 2021-01-13 MED ORDER — ALTEPLASE (STROKE) FULL DOSE INFUSION
0.9000 mg/kg | Freq: Once | INTRAVENOUS | Status: AC
  Administered 2021-01-13: 57.6 mg via INTRAVENOUS
  Filled 2021-01-13: qty 100

## 2021-01-13 MED ORDER — MAGNESIUM SULFATE 2 GM/50ML IV SOLN
2.0000 g | Freq: Once | INTRAVENOUS | Status: AC
  Administered 2021-01-13: 2 g via INTRAVENOUS
  Filled 2021-01-13: qty 50

## 2021-01-13 MED ORDER — CALCIUM CARBONATE-VITAMIN D 500-200 MG-UNIT PO TABS
1.0000 | ORAL_TABLET | Freq: Two times a day (BID) | ORAL | Status: DC
  Administered 2021-01-14 – 2021-01-15 (×2): 1 via ORAL
  Filled 2021-01-13 (×4): qty 1

## 2021-01-13 MED ORDER — ACETAMINOPHEN 325 MG PO TABS: 650.0000 mg | ORAL_TABLET | ORAL | Status: DC | PRN

## 2021-01-13 MED ORDER — ADULT MULTIVITAMIN W/MINERALS CH
1.0000 | ORAL_TABLET | Freq: Every day | ORAL | Status: DC
  Administered 2021-01-14 – 2021-01-15 (×2): 1 via ORAL
  Filled 2021-01-13 (×2): qty 1

## 2021-01-13 MED ORDER — DOCUSATE SODIUM 100 MG PO CAPS
100.0000 mg | ORAL_CAPSULE | Freq: Two times a day (BID) | ORAL | Status: DC | PRN
Start: 2021-01-13 — End: 2021-01-15

## 2021-01-13 MED ORDER — FERROUS SULFATE 325 (65 FE) MG PO TABS
325.0000 mg | ORAL_TABLET | Freq: Every day | ORAL | Status: DC
  Administered 2021-01-14 – 2021-01-15 (×2): 325 mg via ORAL
  Filled 2021-01-13 (×2): qty 1

## 2021-01-13 MED ORDER — VITAMIN B-12 1000 MCG PO TABS
1000.0000 ug | ORAL_TABLET | Freq: Every day | ORAL | Status: DC
  Administered 2021-01-14 – 2021-01-15 (×2): 1000 ug via ORAL
  Filled 2021-01-13 (×2): qty 1

## 2021-01-13 MED ORDER — SODIUM CHLORIDE 0.9 % IV SOLN: 50.0000 mL | Freq: Once | INTRAVENOUS | Status: DC

## 2021-01-13 MED ORDER — ATROPINE SULFATE 1 MG/10ML IJ SOSY
PREFILLED_SYRINGE | INTRAMUSCULAR | Status: AC
  Filled 2021-01-13: qty 10

## 2021-01-13 MED ORDER — TAMSULOSIN HCL 0.4 MG PO CAPS
0.8000 mg | ORAL_CAPSULE | Freq: Every day | ORAL | Status: DC
  Administered 2021-01-14 – 2021-01-15 (×2): 0.8 mg via ORAL
  Filled 2021-01-13 (×2): qty 2

## 2021-01-13 MED ORDER — CHLORHEXIDINE GLUCONATE CLOTH 2 % EX PADS
6.0000 | MEDICATED_PAD | Freq: Every day | CUTANEOUS | Status: DC
  Administered 2021-01-14: 6 via TOPICAL

## 2021-01-13 MED ORDER — ALLOPURINOL 100 MG PO TABS
300.0000 mg | ORAL_TABLET | Freq: Every day | ORAL | Status: DC
  Administered 2021-01-14 – 2021-01-15 (×2): 300 mg via ORAL
  Filled 2021-01-13: qty 1
  Filled 2021-01-13: qty 3

## 2021-01-13 MED ORDER — ALTEPLASE (STROKE) FULL DOSE INFUSION: 0.9000 mg/kg | Freq: Once | INTRAVENOUS | Status: DC

## 2021-01-13 MED ORDER — ONDANSETRON HCL 4 MG/2ML IJ SOLN: 4.0000 mg | Freq: Four times a day (QID) | INTRAMUSCULAR | Status: DC | PRN

## 2021-01-13 NOTE — Progress Notes (Signed)
eLink Physician-Brief Progress Note Patient Name: Xaden Kaufman DOB: [birth year] MRN: [id number]   Date of Service  01/13/2021  HPI/Events of Note  Patient with suspected CVA s/p TPA.  eICU Interventions  New Patient Evaluation completed.        Kerry Kass Christphor Groft 01/13/2021, 2:24 AM

## 2021-01-13 NOTE — Consult Note (Signed)
[facility] Tele[facility]urology Consult Services   Date of Service:   01/12/2021 23:50:38  Diagnosis:     .  I63.9 - Cerebrovascular accident (CVA), unspecified mechanism ([place])  Impression: This is a 80 year old man with an acute stroke. He denies any TPA contraindications. We discussed riks and benefits in great detail including the 5-6% risk of bleeding and ICH. He accepted these risks. TPA bolus given without complication. He will need MRI brain, ICU admission. Repeat CT in 24 hours. Hold antiplatelets for 24 hours.  Metrics: Last Known Well: 01/12/2021 22:30:00 [facility] Notification Time: 01/12/2021 23:50:38 Arrival Time: 01/12/2021 23:33:00 Stamp Time: 01/12/2021 23:50:38 Initial Response Time: 01/13/2021 00:01:34 Symptoms: right sided weakness. NIHSS Start Assessment Time: 01/13/2021 00:06:38 Thrombolytic Early Mix Decision Time: 01/13/2021 00:10:31 Patient is a candidate for Thrombolytic. Thrombolytic Medical Decision: 01/13/2021 00:12:37 Needle Time: 01/13/2021 00:44:21 Weight Noted by Staff: 64 kg  CT head showed no acute hemorrhage or acute core infarct.  ED Physician notified of diagnostic impression and management plan on 01/13/2021 00:23:11  Process delays noted by physician:  . Assessment Delay:     Patient was in radiology for an excessive period of time before physician assessment  . Thrombolytic Administration Delay:     Delays related to prolonged mixing and delivery of thrombolytics  Advanced Imaging: CTA Head and Neck ordered  CTP ordered   Thrombolytic Contraindications:  Last Known Well > 4.5 hours: No CT Head showing hemorrhage: No Ischemic stroke within 3 months: No Severe head trauma within 3 months: No Intracranial/intraspinal surgery within 3 months: No History of intracranial hemorrhage: No Symptoms and signs consistent with an SAH: No GI malignancy or GI bleed within 21 days: No Coagulopathy: Platelets <100 000  /mm3, INR >1.7, aPTT>40 s, or PT >15 s: No Treatment dose of LMWH within the previous 24 hrs: No Use of NOACs in past 48 hours: No Glycoprotein IIb/IIIa receptor inhibitors use: No Symptoms consistent with infective endocarditis: No Suspected aortic arch dissection: No Intra-axial intracranial neoplasm: No  Thrombolytic Decision and Management Plan: Management with thrombolytic treatment was explained to the Patient as was risks and benefits and alternatives to the treatment. Patient agrees with the decision to proceed with thrombolytic treatment. . All questions were answered and the Patient expressed understanding of the treatment plan.  Our recommendations are outlined below.  Recommendations: IV Alteplase recommended.  Thrombolytic bolus given Without Complication.   IV Alteplase/Activase Total Dose - 57.6 mg IV Alteplase/Activase Bolus Dose - 5.8 mg IV Alteplase/Activase Infusion Dose - 51.8 mg   Routine post Thrombolytic monitoring including neuro checks and blood pressure control during/after treatment Monitor blood pressure Check blood pressure and neuro assessment every 15 min for 2 h, then every 30 min for 6 h, and finally every hour for 16 h.  Manage Blood Pressure per post Thrombolytic protocol.      .  Admission to ICU     .  CT brain 24 hours post Thrombolytic     .  NPO until swallowing screen performed and passed     .  No antiplatelet agents or anticoagulants (including heparin for DVT prophylaxis) in first 24 hours     .  No Foley catheter, nasogastric tube, arterial catheter or central venous catheter for 24 hr, unless absolutely necessary     .  Telemetry     .  Bedside swallow evaluation     .  HOB less than 30 degrees     .  Euglycemia     .  Avoid hyperthermia, PRN acetaminophen     .  DVT prophylaxis     .  Inpatient Neurology Consultation     .  Stroke evaluation as per inpatient neurology recommendations  Discussed with ED  physician    ------------------------------------------------------------------------------  History of Present Illness: Patient is a 80 year old Male.  Patient was brought by EMS for symptoms of right sided weakness.  This is a 80 year old man who presents with right sided weakness. the patient was at work and at 20:30 developed acute onset of right facial numbness. He then developed right face and arm weakness. He had slurred speech and felt off balance as well.  Last seen normal was within 4.5 hours. There is no history of hemorrhagic complications or intracranial hemorrhage. There is no history of Recent Anticoagulants. There is no history of recent major surgery. There is no history of recent stroke.  Anticoagulant use:  No  Antiplatelet use: No  Allergies:  Reviewed    Examination: BP(164/78), Pulse(72), Blood Glucose(140) 1A: Level of Consciousness - Alert; keenly responsive + 0 1B: Ask Month and Age - Both Questions Right + 0 1C: Blink Eyes & Squeeze Hands - Performs Both Tasks + 0 2: Test Horizontal Extraocular Movements - Normal + 0 3: Test Visual Fields - No Visual Loss + 0 4: Test Facial Palsy (Use Grimace if Obtunded) - Partial paralysis (lower face) + 2 5A: Test Left Arm Motor Drift - No Drift for 10 Seconds + 0 5B: Test Right Arm Motor Drift - Drift, but doesn't hit bed + 1 6A: Test Left Leg Motor Drift - No Drift for 5 Seconds + 0 6B: Test Right Leg Motor Drift - Drift, but doesn't hit bed + 1 7: Test Limb Ataxia (FNF/Heel-Shin) - No Ataxia + 0 8: Test Sensation - Normal; No sensory loss + 0 9: Test Language/Aphasia - Normal; No aphasia + 0 10: Test Dysarthria - Mild-Moderate Dysarthria: Slurring but can be understood + 1 11: Test Extinction/Inattention - No abnormality + 0  NIHSS Score: 5  Pre-Morbid Modified Rankin Scale: 0 Points = No symptoms at all   Patient/Family was informed the Neurology Consult would occur via TeleHealth consult by way of  interactive audio and video telecommunications and consented to receiving care in this manner.   Patient is being evaluated for possible acute neurologic impairment and high probability of imminent or life-threatening deterioration. I spent total of 50 minutes providing care to this patient, including time for face to face visit via telemedicine, review of medical records, imaging studies and discussion of findings with providers, the patient and/or family.   Dr Barnetta Chapel   [facility] [phone]  Case [id number]

## 2021-01-13 NOTE — ED Notes (Signed)
Patient noted to have improvement regarding R facial droop. R facial droop remains present but improved. Patient slurred speech noted to be improved as well. Patient no longer exhibiting R sided drift and grip and strength improved on R side. Pt continues not to display expressive or receptive aphasia. Denies h/a or vision changes and pupils remain equal and reactive. RN to continue to monitor.

## 2021-01-13 NOTE — Evaluation (Signed)
Clinical/Bedside Swallow Evaluation Patient Details  Name: Bradley Rivera MRN: [id number] Date of Birth: October 29, 1941  Today's Date: 01/13/2021 Time: SLP Start Time (ACUTE ONLY): 34 SLP Stop Time (ACUTE ONLY): 1140 SLP Time Calculation (min) (ACUTE ONLY): 60 min  Past Medical History:  Past Medical History:  Diagnosis Date  . Cancer [facility])    colon, stomach and prostate   Past Surgical History:  Past Surgical History:  Procedure Laterality Date  . GASTRECTOMY    . HERNIA REPAIR     HPI:  Pt is a 80 yo male who presented to [facility] ER on 02/16 with c/o dizziness, right upper extremity weakness, slurred speech, and right sided facial droop onset 2230 on 02/16.  Per ER notes upon arrival to the ER pts NIH stroke scale score was 4, therefore code stroke initiated.  Tele-neurology evaluated pt and recommended tPA.  CTA Head revealed no acute intracranial abnormality, however ASPECTS was 10.  CT Cerebral Perfusion Study revealed no emergent large vessel occlusion or high-grade stenosis of the intracranial arteries, however there was a 4 ml region of acute ischemia in the left middle cerebral artery territory without core infarct or intracranial hemorrhage.  PCCM team contacted for ICU admission post tPA administration.   Assessment / Plan / Recommendation Clinical Impression  Pt appears to present w/ oropharyngeal phase dysphagia w/ oropharyngeal phase deficits suspect d/tNeuromuscular deficits seconday to impact from acute LMCA. This presentation can increase risk for aspiration of po's. Pt was A/O w/ insight and able to follow instructions when given. He fed self given setup support. Pt consumed trials of single ice chips, thin and Nectar liquids cup,  and purees during this session. Overt clinical s/s of aspiration were noted moreso w/ the thin liquid trials despite aspiration precautions. When utilizing swallowing strategy of Right Head Turn when swallowing along w/ aspiration precautions; no  immediate, overt clinical s/s of aspiration noted; no decline in O2 sats (99%), no cough, and no decline in respiratory presentation during/post trials. Delayed throat clearing noted quite inconsistently w/ multiple ozs of Nectar liquids and purees. Thin liquids trials were Not assessed w/ the Right Head Turn d/t the degree of aspiration concern. Oral phase was c/b grossly adequate bolus management for bolus control and A-P transfer and swallow. No gross R labial leakage noted but pt stated he "felt" a little wetness. OM exam revealed R labial weakness and decreased tone/strength; no overt unilateral lingual weakness was noted and posterior strength was adequate. However, slow, inprecise lingual movements noted in rapid, connected sequence. Pt missing most Dentition. Pt fed self w/ setup support using his LUE(RUE dominant). Post discussion w/ MD in room, recommend dysphagia level 1 diet(Puree) w/ Nectar consistency liquids; aspiration precautions including R Head Turn when swallowing; Pills Crushed in puree; tray setup and monitoring at meals, reduce Distractions during meals and No Straws. Stop po's if increased s/s of aspiration noted. NSG/MD updated. SLP Visit Diagnosis: Dysphagia, oropharyngeal phase (R13.12)    Aspiration Risk  Mild aspiration risk;Moderate aspiration risk;Risk for inadequate nutrition/hydration    Diet Recommendation  Dysphagia level 1 (puree) w/ Nectar liquids VIA CUP. Aspiration precautions; Right Head Turn when swallowing each bolus. Tray setup and monitoring at meals -- support w/ feeding as needed d/t RUE weakness. Reduce distractions.  Medication Administration: Crushed with puree (for safer swallowing)    Other  Recommendations Recommended Consults:  (Dietician f/u) Oral Care Recommendations: Oral care BID;Oral care before and after PO;Staff/trained caregiver to provide oral care Other Recommendations: Order thickener  from pharmacy;Prohibited food (jello, ice cream, thin  soups);Remove water pitcher;Have oral suction available   Follow up Recommendations Inpatient Rehab      Frequency and Duration min 3x week  2 weeks       Prognosis Prognosis for Safe Diet Advancement: Fair (-Good) Barriers to Reach Goals: Time post onset;Severity of deficits      Swallow Study   General Date of Onset: 01/12/21 HPI: Pt is a 80 yo male who presented to [facility] ER on 02/16 with c/o dizziness, right upper extremity weakness, slurred speech, and right sided facial droop onset 2230 on 02/16.  Per ER notes upon arrival to the ER pts NIH stroke scale score was 4, therefore code stroke initiated.  Tele-neurology evaluated pt and recommended tPA.  CTA Head revealed no acute intracranial abnormality, however ASPECTS was 10.  CT Cerebral Perfusion Study revealed no emergent large vessel occlusion or high-grade stenosis of the intracranial arteries, however there was a 4 ml region of acute ischemia in the left middle cerebral artery territory without core infarct or intracranial hemorrhage.  PCCM team contacted for ICU admission post tPA administration. Type of Study: Bedside Swallow Evaluation Previous Swallow Assessment: none Diet Prior to this Study: NPO (regular diet at home) Temperature Spikes Noted: No (wbc 7.7) Respiratory Status: Room air History of Recent Intubation: No Behavior/Cognition: Alert;Cooperative;Pleasant mood (dysarthria) Oral Cavity Assessment: Within Functional Limits Oral Care Completed by SLP: Yes Oral Cavity - Dentition: Missing dentition;Poor condition Vision: Functional for self-feeding Self-Feeding Abilities: Able to feed self;Needs assist;Needs set up (LUE) Patient Positioning: Upright in bed (needed min positioning) Baseline Vocal Quality: Normal Volitional Cough: Strong Volitional Swallow: Able to elicit    Oral/Motor/Sensory Function Overall Oral Motor/Sensory Function: Moderate impairment Facial ROM: Reduced right;Suspected CN VII (facial)  dysfunction Facial Symmetry: Abnormal symmetry right;Suspected CN VII (facial) dysfunction Facial Strength: Reduced right;Suspected CN VII (facial) dysfunction Facial Sensation: Within Functional Limits (labial) Lingual ROM: Within Functional Limits Lingual Symmetry: Within Functional Limits (grossly) Lingual Strength: Within Functional Limits Velum: Within Functional Limits Mandible: Within Functional Limits   Ice Chips Ice chips: Within functional limits (grossly) Presentation: Spoon (fed; 4 trials)   Thin Liquid Thin Liquid: Impaired Presentation: Cup;Self Fed (8 trials) Oral Phase Impairments: Reduced labial seal (min) Pharyngeal  Phase Impairments: Throat Clearing - Delayed;Cough - Immediate (x1)    Nectar Thick Nectar Thick Liquid: Impaired Presentation: Cup;Self Fed (~4 oza) Oral Phase Impairments: Reduced labial seal (min) Pharyngeal Phase Impairments: Throat Clearing - Delayed (x2) Other Comments: utilized a R head turn   Honey Thick Honey Thick Liquid: Not tested   Puree Puree: Impaired Presentation: Spoon;Self Fed (~3 ozs) Oral Phase Impairments: Reduced labial seal (min) Pharyngeal Phase Impairments: Throat Clearing - Delayed (x2)   Solid     Solid: Not tested       Orinda Kenner, MS, CCC-SLP Speech Language Pathologist [facility] [phone] Watson,Katherine 01/13/2021,1:33 PM

## 2021-01-13 NOTE — Progress Notes (Signed)
0830 Neuro check - significant right arm drift and decreased ability to use right hand. Speech is delayed with some minute slurring.

## 2021-01-13 NOTE — Consult Note (Addendum)
Neurology Consultation  Reason for Consult: stroke post TPA Referring Physician: Dr. Lanney Gins  CC: Left-sided weakness, status post TPA via telemedicine neurology overnight  History is obtained from: Patient, chart  HPI: Bradley Rivera is a 80 y.o. male past medical history of: Stomach and prostate cancer, presenting to the emergency room yesterday 01/12/2019 with complaints of dizziness and right upper extremity weakness, right facial droop and slurred speech with sudden onset at 10:30 PM.  Per the ER notes, his NIH stroke scale on arrival was 4-code stroke was initiated-telemedicine neurology evaluated the patient-discussed risk benefits of IV TPA and IV TPA was given at 12:44 AM on 01/13/2021 in the emergency room. He was admitted to the ICU for post TPA care. Reports continued feeling of sensation of heaviness and weakness in the right side but no worsening.  No headaches.  No visual symptoms. Denies any preceding illnesses or sicknesses.  No fevers chills.  No abdominal pain nausea vomiting.  No chest pain shortness of breath.  LKW: 2230 hrs. on 01/12/2021 tpa given?:  Yes-by telemedicine neurology Premorbid modified Rankin scale (mRS)0  ROS: Performed and negative except as noted in HPI Past Medical History:  Diagnosis Date  . Cancer [facility])    colon, stomach and prostate     History reviewed. No pertinent family history.   Social History:   reports that he has been smoking. He has never used smokeless tobacco. No history on file for alcohol use and drug use.  Medications  Current Facility-Administered Medications:  .  acetaminophen (TYLENOL) tablet 650 mg, 650 mg, Oral, Q4H PRN, Awilda Bill, NP .  allopurinol (ZYLOPRIM) tablet 300 mg, 300 mg, Oral, Daily, Blakeney, Dana G, NP .  atorvastatin (LIPITOR) tablet 40 mg, 40 mg, Oral, Daily, Blakeney, Dreama Saa, NP .  calcium-vitamin D (OSCAL WITH D) 500-200 MG-UNIT per tablet 1 tablet, 1 tablet, Oral, BID WC, Blakeney, Dreama Saa,  NP .  Chlorhexidine Gluconate Cloth 2 % PADS 6 each, 6 each, Topical, Q0600, Ottie Glazier, MD .  docusate sodium (COLACE) capsule 100 mg, 100 mg, Oral, BID PRN, Awilda Bill, NP .  ferrous sulfate tablet 325 mg, 325 mg, Oral, Q breakfast, Blakeney, Dreama Saa, NP .  multivitamin with minerals tablet 1 tablet, 1 tablet, Oral, Daily, Blakeney, Dreama Saa, NP .  ondansetron (ZOFRAN) injection 4 mg, 4 mg, Intravenous, Q6H PRN, Awilda Bill, NP .  polyethylene glycol (MIRALAX / GLYCOLAX) packet 17 g, 17 g, Oral, Daily PRN, Awilda Bill, NP .  tamsulosin (FLOMAX) capsule 0.8 mg, 0.8 mg, Oral, Daily, Blakeney, Dreama Saa, NP .  vitamin B-12 (CYANOCOBALAMIN) tablet 1,000 mcg, 1,000 mcg, Oral, Daily, Awilda Bill, NP   Exam: Current vital signs: BP (!) 149/70   Pulse 63   Temp 97.7 F (36.5 C) (Axillary)   Resp 16   Ht 6\' 1"  (1.854 m)   Wt 67.4 kg   SpO2 100%   BMI 19.60 kg/m  Vital signs in last 24 hours: Temp:  [96.9 F (36.1 C)-98.2 F (36.8 C)] 97.7 F (36.5 C) (02/17 0800) Pulse Rate:  [58-80] 63 (02/17 0800) Resp:  [8-26] 16 (02/17 0800) BP: (130-164)/(52-106) 149/70 (02/17 0800) SpO2:  [99 %-100 %] 100 % (02/17 0800) Weight:  [64 kg-67.4 kg] 67.4 kg (02/17 0418) GENERAL: Awake, alert in NAD HEENT: - Normocephalic and atraumatic, dry mm, no LN++, no Thyromegally LUNGS - Clear to auscultation bilaterally with no wheezes CV - S1S2 RRR, no m/r/g, equal pulses bilaterally. ABDOMEN -  Soft, nontender, nondistended with normoactive BS Ext: warm, well perfused, intact peripheral pulses, no edema  NEURO:  Mental Status: AA&Ox3  Language: speech is mildly dysarthric naming, repetition, fluency, and comprehension intact. Cranial Nerves: PERRL EOMI, visual fields full, mild facial asymmetry with subtle right nasolabial fold flattening, facial sensation intact, hearing intact, tongue/uvula/soft palate midline, normal sternocleidomastoid and trapezius muscle strength. No evidence of  tongue atrophy or fibrillations Motor: Right upper extremity 4/5 proximally and 4 -/5 distally with vertical drift.  Right lower extremity 5/5.  Left upper and lower extremity without drift 5/5. Tone: is normal and bulk is normal Sensation- Intact to light touch bilaterally-but reports a subjective sensation of heaviness on the right upper extremity. Coordination: Ataxia in proportion to the weakness on the right upper extremity.  Otherwise no ataxia Gait- deferred  NIHSS 1a Level of Conscious.: 0 1b LOC Questions: 0 1c LOC Commands: 0 2 Best Gaze: 0 3 Visual: 0 4 Facial Palsy: 1 5a Motor Arm - left: 0 5b Motor Arm - Right: 1 6a Motor Leg - Left: 0 6b Motor Leg - Right: 0 7 Limb Ataxia: 0 8 Sensory: 1 9 Best Language: 0 10 Dysarthria: 1 11 Extinct. and Inatten.: 0 TOTAL: 4    Labs I have reviewed labs in epic and the results pertinent to this consultation are:  CBC    Component Value Date/Time   WBC 7.7 01/13/2021 0406   RBC 3.60 (L) 01/13/2021 0406   HGB 11.9 (L) 01/13/2021 0406   HCT 35.5 (L) 01/13/2021 0406   PLT 164 01/13/2021 0406   MCV 98.6 01/13/2021 0406   MCH 33.1 01/13/2021 0406   MCHC 33.5 01/13/2021 0406   RDW 14.5 01/13/2021 0406   LYMPHSABS 2.1 01/12/2021 2350   MONOABS 0.9 01/12/2021 2350   EOSABS 0.1 01/12/2021 2350   BASOSABS 0.0 01/12/2021 2350    CMP     Component Value Date/Time   NA 139 01/13/2021 0406   K 4.2 01/13/2021 0406   CL 103 01/13/2021 0406   CO2 27 01/13/2021 0406   GLUCOSE 93 01/13/2021 0406   BUN 14 01/13/2021 0406   CREATININE 1.00 01/13/2021 0406   CALCIUM 8.9 01/13/2021 0406   PROT 7.0 01/12/2021 2350   ALBUMIN 3.4 (L) 01/12/2021 2350   AST 25 01/12/2021 2350   ALT 17 01/12/2021 2350   ALKPHOS 70 01/12/2021 2350   BILITOT 0.5 01/12/2021 2350   GFRNONAA >60 01/13/2021 0406   GFRAA 56 (L) 05/02/2019 1555    Lipid Panel     Component Value Date/Time   CHOL 140 01/13/2021 0406   TRIG 47 01/13/2021 0406   HDL 61  01/13/2021 0406   CHOLHDL 2.3 01/13/2021 0406   VLDL 9 01/13/2021 0406   LDLCALC 70 01/13/2021 0406   LDL 70 A1c pending 2D echo pending  Imaging I have reviewed the images obtained:  CT-scan of the brain-no bleed, aspects 10. CTA head and neck-no emergent LVO or high-grade stenosis of the intracranial arteries.  Perfusion study with 4 mL of acute ischemia in the left MCA artery territory without cord infarct or intracranial hemorrhage.  CTA neck with a left carotid web at the bifurcation without hemodynamically significant stenosis. Aortic arch with bulky soft plaque/thrombus.  Assessment:  80 year old man with above past medical history presenting for sudden onset of right-sided weakness, slurred speech and dysarthria, within the window for IV TPA, risks and benefits discussed by telemedicine neurology and IV TPA given at 12:44 AM this morning. Remains  in the ICU for post TPA care. CT head with no acute abnormality CT angio head and neck with no intracranial abnormality but the left carotid in the neck has a web at the bifurcation.. Aortic arch also has bulky plaque/thrombus. 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 -No aspirin, no anticoagulants for at least 24 hours from the TPA. -Post TPA MRI/head CT to be done at 12:45 AM on 01/14/2021.  If negative for bleed, can start aspirin and Plavix at that time. No need to load. -2D echo pending -A1c pending-goal less than 7 -LDL 70-at goal.  Continue atorvastatin 40 -PT OT speech therapy -For the  left carotid web-will need outpatient endovascular follow-up-I have already spoken to the interventional neuroradiologist at [facility], and he will be scheduled for an outpatient diagnostic angio/stenting sometime next week. -Medical management per ICU as you are. -Appreciate ICU assistance and management of this patient.  Discussed my plan with Dr.Aleskerov. PCCM   -- Amie Portland, MD Neurologist [facility] Pager: [phone]  CRITICAL CARE ATTESTATION Performed by: Amie Portland, MD Total critical care time: 39 minutes Critical care time was exclusive of separately billable procedures and treating other patients and/or supervising APPs/Residents/Students Critical care was necessary to treat or prevent imminent or life-threatening deterioration due to acute ischemic stroke, status post IV thrombolytic This patient is critically ill and at significant risk for neurological worsening and/or death and care requires constant monitoring. Critical care was time spent personally by me on the following activities: development of treatment plan with patient and/or surrogate as well as nursing, discussions with consultants, evaluation of patient's response to treatment, examination of patient, obtaining history from patient or surrogate, ordering and performing treatments and interventions, ordering and review of laboratory studies, ordering and review of radiographic studies, pulse oximetry, re-evaluation of patient's condition, participation in multidisciplinary rounds and medical decision making of high complexity in the care of this patient.

## 2021-01-13 NOTE — Progress Notes (Signed)
PT Cancellation Note  Patient Details Name: Bradley Rivera MRN: [id number] DOB: 1941/02/16   Cancelled Treatment:    Reason Eval/Treat Not Completed: Patient not medically ready t-PA administered early 2/17, per protocol will initiate PT services after 24hrs bed rest.  PT held this date.  Kreg Shropshire, DPT 01/13/2021, 10:59 AM

## 2021-01-13 NOTE — H&P (Signed)
NAME:  Bradley Rivera, MRN:  [id number], DOB:  Jan 18, 1941, LOS: 0 ADMISSION DATE:  01/12/2021, CONSULTATION DATE: 01/13/2021 REFERRING MD: Dr. Alfred Levins, CHIEF COMPLAINT: Right Sided Weakness  Brief History:  80 yo male admitted with acute CVA s/p tPA   History of Present Illness:  This is a 80 yo male who presented to [facility] ER on 02/16 with c/o dizziness, right upper extremity weakness, slurred speech, and right sided facial droop onset 2230 on 02/16.  Per ER notes upon arrival to the ER pts NIH stroke scale score was 4, therefore code stroke initiated.  Tele-neurology evaluated pt and recommended tPA.  CTA Head revealed no acute intracranial abnormality, however ASPECTS was 10.  CT Cerebral Perfusion Study revealed no emergent large vessel occlusion or high-grade stenosis of the intracranial arteries, however there was a 4 ml region of acute ischemia in the left middle cerebral artery territory without core infarct or intracranial hemorrhage.  PCCM team contacted for ICU admission post tPA administration.   Past Medical History:  Remote Stomach, Colon and Prostate Cancer   Significant Hospital Events:  02/17: Pt admitted to ICU post tPA  Consults:  Intensivist  Neurology   Procedures:  None   Significant Diagnostic Tests:  CT Head Code Stroke 02/17>>No acute intracranial abnormality. ASPECTS is 10. 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 CTA Head Neck and Cerebral Perfusion 02/17>>No emergent large vessel occlusion or high-grade stenosis of the intracranial arteries. Perfusion study shows 4 mL region of acute ischemia in the left middle cerebral artery territory without core infarct or intracranial hemorrhage. 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  Micro Data:  COVID-19/Influenza PCR 02/17>>  Antimicrobials:  None   Interim History / Subjective:  Pt states "I'm cold" no other complaints at this time   Objective   Blood  pressure (!) 152/70, pulse 68, temperature 97.9 F (36.6 C), resp. rate 17, height 6\' 1"  (1.854 m), weight 64 kg, SpO2 100 %.       No intake or output data in the 24 hours ending 01/13/21 0117 Filed Weights   01/12/21 2339  Weight: 64 kg   Examination: General: frail elderly male resting in bed, NAD  HENT: supple, no JVD  Lungs: clear throughout, even, non labored  Cardiovascular: nsr, rrr, no R/G, 2+ radial 1+ distal pulses, no edema Abdomen: concave, +BS x4, soft, non distended, non tender Extremities: RUE weakness, normal tone, moves all extremities  Neuro: alert and oriented, follows commands, PERRLA, mild right facial droop present with slightly slurred speech   Assessment & Plan:   Acute CVA s/p tPA Continuous telemetry monitoring  NIH screening per code stroke protocol  NPO until swallowing screen performed and passed  No chemical VTE prophylaxis or antiplatelet agents for now; SCD's for VTE px  Avoid invasive catheters for 24hrs post tPA Echo and [place] Bilateral Carotids pending Lipid panel, hemoglobin A1c pending, and urine drug screen pending  Continue outpatient atorvastatin  Maintain normothermia and euglycemia Allow for permissive hypertension  Repeat CT Head 24hrs post tPA Will need MRI Brain  Neurology consulted appreciate input  OT and PT evaluation pending   Anemia without obvious acute blood loss Continue outpatient ferrous sulfate    Best practice (evaluated daily)  Diet: NPO for now pending swallowing screen  Pain/Anxiety/Delirium protocol (if indicated): N/A VAP protocol (if indicated): N/A DVT prophylaxis: SCD's  GI prophylaxis: N/A Glucose control: N/A Mobility: Bedrest for now post tPA Disposition: ICU   Goals of Care:  Last date of multidisciplinary goals of care discussion: N/A Family and staff present: N/A Summary of discussion: Discussed plan of care with pt Follow up goals of care discussion due: 01/13/2021 Code Status: Full Code    Labs   CBC: Recent Labs  Lab 01/12/21 2350  WBC 7.6  NEUTROABS 4.4  HGB 11.6*  HCT 35.9*  MCV 100.8*  PLT 301    Basic Metabolic Panel: Recent Labs  Lab 01/12/21 2350  NA 140  K 4.2  CL 107  CO2 26  GLUCOSE 101*  BUN 14  CREATININE 1.06  CALCIUM 9.0   GFR: Estimated Creatinine Clearance: 51.2 mL/min (by C-G formula based on SCr of 1.06 mg/dL). Recent Labs  Lab 01/12/21 2350  WBC 7.6    Liver Function Tests: Recent Labs  Lab 01/12/21 2350  AST 25  ALT 17  ALKPHOS 70  BILITOT 0.5  PROT 7.0  ALBUMIN 3.4*   No results for input(s): LIPASE, AMYLASE in the last 168 hours. No results for input(s): AMMONIA in the last 168 hours.  ABG No results found for: PHART, PCO2ART, PO2ART, HCO3, TCO2, ACIDBASEDEF, O2SAT   Coagulation Profile: Recent Labs  Lab 01/12/21 2350  INR 1.0    Cardiac Enzymes: No results for input(s): CKTOTAL, CKMB, CKMBINDEX, TROPONINI in the last 168 hours.  HbA1C: No results found for: HGBA1C  CBG: No results for input(s): GLUCAP in the last 168 hours.  Review of Systems: Positives in BOLD   Gen: Denies fever, chills, weight change, fatigue, night sweats HEENT: Denies blurred vision, double vision, hearing loss, tinnitus, sinus congestion, rhinorrhea, sore throat, neck stiffness, dysphagia PULM: Denies shortness of breath, cough, sputum production, hemoptysis, wheezing CV: Denies chest pain, edema, orthopnea, paroxysmal nocturnal dyspnea, palpitations GI: Denies abdominal pain, nausea, vomiting, diarrhea, hematochezia, melena, constipation, change in bowel habits GU: Denies dysuria, hematuria, polyuria, oliguria, urethral discharge Endocrine: Denies hot or cold intolerance, polyuria, polyphagia or appetite change Derm: Denies rash, dry skin, scaling or peeling skin change Heme: Denies easy bruising, bleeding, bleeding gums Neuro: right sided facial droop, right upper extremity weakness, headache, numbness, weakness, slurred  speech, loss of memory or consciousness   Past Medical History:  He,  has a past medical history of Cancer ([place]).   Surgical History:   Past Surgical History:  Procedure Laterality Date  . GASTRECTOMY    . HERNIA REPAIR       Social History:   reports that he has been smoking. He has never used smokeless tobacco.   Family History:  His family history is not on file.   Allergies Allergies  Allergen Reactions  . Lisinopril Swelling  . Bromfenac   . Nepafenac Other (See Comments)  . Other Other (See Comments)  . Terazosin Palpitations and Other (See Comments)     Home Medications  Prior to Admission medications   Medication Sig Start Date End Date Taking? Authorizing Provider  acetaminophen (TYLENOL) 325 MG tablet Take 650 mg by mouth every 6 (six) hours as needed.   Yes [provider]  allopurinol (ZYLOPRIM) 300 MG tablet Take 300 mg by mouth daily. 01/04/21  Yes [provider]  atorvastatin (LIPITOR) 80 MG tablet Take 40 mg by mouth daily. 01/04/21  Yes [provider]  benzocaine (HURRICAINE) 20 % GEL Use as directed 1 application in the mouth or throat 4 (four) times daily as needed.   Yes [provider]  Calcium Citrate-Vitamin D (CITRUS CALCIUM/VITAMIN D) 200-250 MG-UNIT TABS Take 2 tablets by mouth  2 (two) times daily with a meal. 12/01/20  Yes [provider]  Cholecalciferol 25 MCG (1000 UT) tablet TAKE TWO TABLETS BY MOUTH EVERY DAY - 50 MCG IS EQUAL TO 2000UNITS OF VITAMIN D 02/09/20  Yes [provider]  FEROSUL 325 (65 Fe) MG tablet Take 325 mg by mouth daily with breakfast. 01/04/21  Yes [provider]  Multiple Vitamin (MULTIVITAMIN WITH MINERALS) TABS tablet Take 1 tablet by mouth daily.   Yes [provider]  SENEXON-S 8.6-50 MG tablet Take 1 tablet by mouth daily. 01/04/21  Yes [provider]  tamsulosin (FLOMAX) 0.4 MG CAPS capsule Take 0.8 mg by mouth daily. 01/04/21  Yes [provider]  vitamin B-12 (CYANOCOBALAMIN) 1000 MCG tablet Take 1,000 mcg by mouth daily. 12/01/20  Yes [provider]  vitamin C (ASCORBIC ACID) 500 MG tablet Take 1,000 mg by mouth daily. 01/04/21  Yes [provider]     Critical care time: 35 minutes      Marda Stalker, [place] Pager [phone] (please enter 7 digits) PCCM Consult Pager [phone] (please enter 7 digits)

## 2021-01-13 NOTE — Progress Notes (Signed)
OT Cancellation Note  Patient Details Name: Bradley Rivera MRN: [id number] DOB: 1941/10/26   Cancelled Treatment:    Reason Eval/Treat Not Completed: Patient not medically ready. Consult received and chart reviewed. Patient admitted to ICU for CVA work up with tPA infusion (started at [place], 01/13/21).  Per guidelines, to be on strict bedrest x24 hours post infusion.  Will continue to follow and initiate services as medically appropriate.  Dessie Coma, M.S. OTR/L  01/13/21, 8:40 AM  ascom [phone]

## 2021-01-13 NOTE — Progress Notes (Signed)
*  PRELIMINARY RESULTS* Echocardiogram 2D Echocardiogram has been performed.  Bradley Rivera 01/13/2021, 11:30 AM

## 2021-01-13 NOTE — Progress Notes (Signed)
After 8 hours of every 1 hour neuro checks and modified NIH scoring patient is sound to sleep at 1830. He is currently on a pureed diet per speech therapy. Stroke Education placed in patients room. No change except patient has better mobility of the right hand. He can somewhat move the right fingers. He still has some slurring of his speech.

## 2021-01-13 NOTE — ED Provider Notes (Signed)
[facility] Emergency Department Provider Note  ____________________________________________  Time seen: Approximately 12:53 AM  I have reviewed the triage vital signs and the nursing notes.   HISTORY  Chief Complaint Stroke Symptoms and Code Stroke   HPI Bradley Rivera is a 80 y.o. male with a history of remote colon, stomach and prostate cancer who presents for evaluation of right-sided facial droop and right upper extremity weakness.  Patient reports that he was pulled into the parking lot for his work at 10:30 PM when he developed sudden onset of right upper extremity weakness and difficulty with his speech.  No dizziness, no headache, no chest pain, no back pain.  His symptoms have persisted.  Patient denies right lower extremity weakness.  Denies any prior history of stroke or family history of stroke.  He is a smoker.   Past Medical History:  Diagnosis Date  . Cancer [facility])    colon, stomach and prostate    Patient Active Problem List   Diagnosis Date Noted  . Acute CVA (cerebrovascular accident) ([place]) 01/13/2021    Past Surgical History:  Procedure Laterality Date  . GASTRECTOMY    . HERNIA REPAIR      Prior to Admission medications   Medication Sig Start Date End Date Taking? Authorizing Provider  acetaminophen (TYLENOL) 325 MG tablet Take 650 mg by mouth every 6 (six) hours as needed.   Yes [provider]  allopurinol (ZYLOPRIM) 300 MG tablet Take 300 mg by mouth daily. 01/04/21  Yes [provider]  atorvastatin (LIPITOR) 80 MG tablet Take 40 mg by mouth daily. 01/04/21  Yes [provider]  benzocaine (HURRICAINE) 20 % GEL Use as directed 1 application in the mouth or throat 4 (four) times daily as needed.   Yes [provider]  Calcium Citrate-Vitamin D (CITRUS CALCIUM/VITAMIN D) 200-250 MG-UNIT TABS Take 2 tablets by mouth 2 (two) times daily with a meal. 12/01/20  Yes [provider]   Cholecalciferol 25 MCG (1000 UT) tablet TAKE TWO TABLETS BY MOUTH EVERY DAY - 50 MCG IS EQUAL TO 2000UNITS OF VITAMIN D 02/09/20  Yes [provider]  FEROSUL 325 (65 Fe) MG tablet Take 325 mg by mouth daily with breakfast. 01/04/21  Yes [provider]  Multiple Vitamin (MULTIVITAMIN WITH MINERALS) TABS tablet Take 1 tablet by mouth daily.   Yes [provider]  SENEXON-S 8.6-50 MG tablet Take 1 tablet by mouth daily. 01/04/21  Yes [provider]  tamsulosin (FLOMAX) 0.4 MG CAPS capsule Take 0.8 mg by mouth daily. 01/04/21  Yes [provider]  vitamin B-12 (CYANOCOBALAMIN) 1000 MCG tablet Take 1,000 mcg by mouth daily. 12/01/20  Yes [provider]  vitamin C (ASCORBIC ACID) 500 MG tablet Take 1,000 mg by mouth daily. 01/04/21  Yes [provider]    Allergies Lisinopril, Bromfenac, Nepafenac, Other, and Terazosin  History reviewed. No pertinent family history.  Social History Social History   Tobacco Use  . Smoking status: Current Some Day Smoker  . Smokeless tobacco: Never Used    Review of Systems  Constitutional: Negative for fever. Eyes: Negative for visual changes. ENT: Negative for sore throat. Neck: No neck pain  Cardiovascular: Negative for chest pain. Respiratory: Negative for shortness of breath. Gastrointestinal: Negative for abdominal pain, vomiting or diarrhea. Genitourinary: Negative for dysuria. Musculoskeletal: Negative for back pain. Skin: Negative for rash. Neurological: Negative for headaches, + right facial droop, slurred speech, right upper extremity weakness Psych: No SI  or HI  ____________________________________________   PHYSICAL EXAM:  VITAL SIGNS: ED Triage Vitals  Enc Vitals Group     BP 01/12/21 2344 (!) 164/78     Pulse Rate 01/12/21 2342 72     Resp 01/12/21 2342 17     Temp 01/12/21 2342 97.9 F (36.6 C)     Temp Source 01/12/21 2342 Oral     SpO2 01/12/21 2342 100 %      Weight 01/12/21 2339 141 lb (64 kg)     Height 01/12/21 2339 6\' 1"  (1.854 m)     Head Circumference --      Peak Flow --      Pain Score 01/12/21 2338 0     Pain Loc --      Pain Edu? --      Excl. in [place]? --     Constitutional: Alert and oriented. Well appearing and in no apparent distress. HEENT:      Head: Normocephalic and atraumatic.         Eyes: Conjunctivae are normal. Sclera is non-icteric.       Mouth/Throat: Mucous membranes are moist.       Neck: Supple with no signs of meningismus. Cardiovascular: Regular rate and rhythm. No murmurs, gallops, or rubs. 2+ symmetrical distal pulses are present in all extremities. No JVD. Respiratory: Normal respiratory effort. Lungs are clear to auscultation bilaterally.  Gastrointestinal: Soft, non tender, and non distended. Musculoskeletal:  No edema, cyanosis, or erythema of extremities. Neurologic: Normal language. R sided lower facial droop, slurred speech, RUE 4/5 strength, normal in all extremities, RUE dysmetria and pronator drift Skin: Skin is warm, dry and intact. No rash noted. Psychiatric: Mood and affect are normal. Speech and behavior are normal.   NIH Stroke Scale  Interval: Baseline Time: 1:15 AM Person Administering Scale: [place] stroke scale items in the order listed. Record performance in each category after each subscale exam. Do not go back and change scores. Follow directions provided for each exam technique. Scores should reflect what the patient does, not what the clinician thinks the patient can do. The clinician should record answers while administering the exam and work quickly. Except where indicated, the patient should not be coached (i.e., repeated requests to patient to make a special effort).   1a  Level of consciousness: 0=alert; keenly responsive  1b. LOC questions:  0=Performs both tasks correctly  1c. LOC commands: 0=Performs both tasks correctly  2.  Best Gaze: 0=normal  3.   Visual: 0=No visual loss  4. Facial Palsy: 1=Minor paralysis (flattened nasolabial fold, asymmetric on smiling)  5a.  Motor left arm: 0=No drift, limb holds 90 (or 45) degrees for full 10 seconds  5b.  Motor right arm: 1=Drift, limb holds 90 (or 45) degrees but drifts down before full 10 seconds: does not hit bed  6a. motor left leg: 0=No drift, limb holds 90 (or 45) degrees for full 10 seconds  6b  Motor right leg:  0=No drift, limb holds 90 (or 45) degrees for full 10 seconds  7. Limb Ataxia: 1=Present in one limb  8.  Sensory: 0=Normal; no sensory loss  9. Best Language:  0=No aphasia, normal  10. Dysarthria: 1=Mild to moderate, patient slurs at least some words and at worst, can be understood with some difficulty  11. Extinction and Inattention: 0=No abnormality   Total:   4     ____________________________________________   LABS (all labs ordered are listed, but only abnormal  results are displayed)  Labs Reviewed  APTT - Abnormal; Notable for the following components:      Result Value   aPTT 39 (*)    All other components within normal limits  CBC - Abnormal; Notable for the following components:   RBC 3.56 (*)    Hemoglobin 11.6 (*)    HCT 35.9 (*)    MCV 100.8 (*)    All other components within normal limits  COMPREHENSIVE METABOLIC PANEL - Abnormal; Notable for the following components:   Glucose, Bld 101 (*)    Albumin 3.4 (*)    All other components within normal limits  RESP PANEL BY RT-PCR (FLU A&B, COVID) ARPGX2  ETHANOL  PROTIME-INR  DIFFERENTIAL  URINE DRUG SCREEN, QUALITATIVE ([facility] ONLY)  URINALYSIS, ROUTINE W REFLEX MICROSCOPIC  CBC  BASIC METABOLIC PANEL  MAGNESIUM  PHOSPHORUS  LIPID PANEL  HEMOGLOBIN A1C   ____________________________________________  EKG  ED ECG REPORT I, Rudene Re, the attending physician, personally viewed and interpreted this ECG.  Normal sinus rhythm, rate of 65, normal intervals, normal axis, no ST elevations or  depressions. ____________________________________________  RADIOLOGY  I have personally reviewed the images performed during this visit and I agree with the Radiologist's read.   Interpretation by Radiologist:  CT Angio Head W or Wo Contrast  Result Date: 01/13/2021 CLINICAL DATA:  Right-sided weakness EXAM: CT ANGIOGRAPHY HEAD AND NECK CT PERFUSION BRAIN TECHNIQUE: Multidetector CT imaging of the head and neck was performed using the standard protocol during bolus administration of intravenous contrast. Multiplanar CT image reconstructions and MIPs were obtained to evaluate the vascular anatomy. Carotid stenosis measurements (when applicable) are obtained utilizing NASCET criteria, using the distal internal carotid diameter as the denominator. Multiphase CT imaging of the brain was performed following IV bolus contrast injection. Subsequent parametric perfusion maps were calculated using RAPID software. CONTRAST:  170mL OMNIPAQUE IOHEXOL 350 MG/ML SOLN COMPARISON:  None. FINDINGS: CTA NECK FINDINGS SKELETON: There is no bony spinal canal stenosis. No lytic or blastic lesion. OTHER NECK: Normal pharynx, larynx and major salivary glands. No cervical lymphadenopathy. Unremarkable thyroid gland. UPPER CHEST: No pneumothorax or pleural effusion. No nodules or masses. AORTIC ARCH: There is moderate atherosclerosis of the aortic arch. There is no aneurysm, dissection or hemodynamically significant stenosis of the visualized portion of the aorta. Conventional 3 vessel aortic branching pattern. The visualized proximal subclavian arteries are widely patent. RIGHT CAROTID SYSTEM: Normal without aneurysm, dissection or stenosis. 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 No hemodynamically significant stenosis. VERTEBRAL ARTERIES: Left dominant configuration. 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 The left vertebral artery is normal along  its entire course. CTA HEAD FINDINGS POSTERIOR CIRCULATION: --Vertebral arteries: Normal left.  The right terminates in PICA. --Inferior cerebellar arteries: Normal. --Basilar artery: Diminutive --Superior cerebellar arteries: Normal. --Posterior cerebral arteries (PCA): Normal. ANTERIOR CIRCULATION: --Intracranial internal carotid arteries: Normal. --Anterior cerebral arteries (ACA): Normal. Hypoplastic right A1 segment, normal variant. --Middle cerebral arteries (MCA): Normal. VENOUS SINUSES: As permitted by contrast timing, patent. ANATOMIC VARIANTS: Fetal origins of both posterior cerebral arteries. Review of the MIP images confirms the above findings. CT Brain Perfusion Findings: ASPECTS: 10 CBF (<30%) Volume: 73mL Perfusion (Tmax>6.0s) volume: 29mL Mismatch Volume: 34mL Infarction Location:No infarct. 4 mL region of ischemia identified in the left middle cerebral artery territory. IMPRESSION: 1. No emergent large vessel occlusion or high-grade stenosis of the intracranial arteries. 2. Perfusion study shows 4 mL region of acute  ischemia in the left middle cerebral artery territory without core infarct or intracranial hemorrhage. 3. 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 Aortic Atherosclerosis (ICD10-I70.0). Electronically Signed   By: Ulyses Jarred M.D.   On: 01/13/2021 00:56   CT Angio Neck W and/or Wo Contrast  Result Date: 01/13/2021 CLINICAL DATA:  Right-sided weakness EXAM: CT ANGIOGRAPHY HEAD AND NECK CT PERFUSION BRAIN TECHNIQUE: Multidetector CT imaging of the head and neck was performed using the standard protocol during bolus administration of intravenous contrast. Multiplanar CT image reconstructions and MIPs were obtained to evaluate the vascular anatomy. Carotid stenosis measurements (when applicable) are obtained utilizing NASCET criteria, using the distal internal carotid diameter as the denominator. Multiphase CT imaging of the brain was performed  following IV bolus contrast injection. Subsequent parametric perfusion maps were calculated using RAPID software. CONTRAST:  150mL OMNIPAQUE IOHEXOL 350 MG/ML SOLN COMPARISON:  None. FINDINGS: CTA NECK FINDINGS SKELETON: There is no bony spinal canal stenosis. No lytic or blastic lesion. OTHER NECK: Normal pharynx, larynx and major salivary glands. No cervical lymphadenopathy. Unremarkable thyroid gland. UPPER CHEST: No pneumothorax or pleural effusion. No nodules or masses. AORTIC ARCH: There is moderate atherosclerosis of the aortic arch. There is no aneurysm, dissection or hemodynamically significant stenosis of the visualized portion of the aorta. Conventional 3 vessel aortic branching pattern. The visualized proximal subclavian arteries are widely patent. RIGHT CAROTID SYSTEM: Normal without aneurysm, dissection or stenosis. 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 No hemodynamically significant stenosis. VERTEBRAL ARTERIES: Left dominant configuration. 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 The left vertebral artery is normal along its entire course. CTA HEAD FINDINGS POSTERIOR CIRCULATION: --Vertebral arteries: Normal left.  The right terminates in PICA. --Inferior cerebellar arteries: Normal. --Basilar artery: Diminutive --Superior cerebellar arteries: Normal. --Posterior cerebral arteries (PCA): Normal. ANTERIOR CIRCULATION: --Intracranial internal carotid arteries: Normal. --Anterior cerebral arteries (ACA): Normal. Hypoplastic right A1 segment, normal variant. --Middle cerebral arteries (MCA): Normal. VENOUS SINUSES: As permitted by contrast timing, patent. ANATOMIC VARIANTS: Fetal origins of both posterior cerebral arteries. Review of the MIP images confirms the above findings. CT Brain Perfusion Findings: ASPECTS: 10 CBF (<30%) Volume: 72mL Perfusion (Tmax>6.0s) volume: 73mL Mismatch Volume: 22mL Infarction Location:No infarct.  4 mL region of ischemia identified in the left middle cerebral artery territory. IMPRESSION: 1. No emergent large vessel occlusion or high-grade stenosis of the intracranial arteries. 2. Perfusion study shows 4 mL region of acute ischemia in the left middle cerebral artery territory without core infarct or intracranial hemorrhage. 3. 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 Aortic Atherosclerosis (ICD10-I70.0). Electronically Signed   By: Ulyses Jarred M.D.   On: 01/13/2021 00:56   CT CEREBRAL PERFUSION W CONTRAST  Result Date: 01/13/2021 CLINICAL DATA:  Right-sided weakness EXAM: CT ANGIOGRAPHY HEAD AND NECK CT PERFUSION BRAIN TECHNIQUE: Multidetector CT imaging of the head and neck was performed using the standard protocol during bolus administration of intravenous contrast. Multiplanar CT image reconstructions and MIPs were obtained to evaluate the vascular anatomy. Carotid stenosis measurements (when applicable) are obtained utilizing NASCET criteria, using the distal internal carotid diameter as the denominator. Multiphase CT imaging of the brain was performed following IV bolus contrast injection. Subsequent parametric perfusion maps were calculated using RAPID software. CONTRAST:  165mL OMNIPAQUE IOHEXOL 350 MG/ML SOLN COMPARISON:  None. FINDINGS: CTA NECK FINDINGS SKELETON: There is no bony spinal canal stenosis. No lytic or blastic lesion. OTHER NECK: Normal  pharynx, larynx and major salivary glands. No cervical lymphadenopathy. Unremarkable thyroid gland. UPPER CHEST: No pneumothorax or pleural effusion. No nodules or masses. AORTIC ARCH: There is moderate atherosclerosis of the aortic arch. There is no aneurysm, dissection or hemodynamically significant stenosis of the visualized portion of the aorta. Conventional 3 vessel aortic branching pattern. The visualized proximal subclavian arteries are widely patent. RIGHT CAROTID SYSTEM: Normal without aneurysm,  dissection or stenosis. 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 No hemodynamically significant stenosis. VERTEBRAL ARTERIES: Left dominant configuration. 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 The left vertebral artery is normal along its entire course. CTA HEAD FINDINGS POSTERIOR CIRCULATION: --Vertebral arteries: Normal left.  The right terminates in PICA. --Inferior cerebellar arteries: Normal. --Basilar artery: Diminutive --Superior cerebellar arteries: Normal. --Posterior cerebral arteries (PCA): Normal. ANTERIOR CIRCULATION: --Intracranial internal carotid arteries: Normal. --Anterior cerebral arteries (ACA): Normal. Hypoplastic right A1 segment, normal variant. --Middle cerebral arteries (MCA): Normal. VENOUS SINUSES: As permitted by contrast timing, patent. ANATOMIC VARIANTS: Fetal origins of both posterior cerebral arteries. Review of the MIP images confirms the above findings. CT Brain Perfusion Findings: ASPECTS: 10 CBF (<30%) Volume: 67mL Perfusion (Tmax>6.0s) volume: 40mL Mismatch Volume: 97mL Infarction Location:No infarct. 4 mL region of ischemia identified in the left middle cerebral artery territory. IMPRESSION: 1. No emergent large vessel occlusion or high-grade stenosis of the intracranial arteries. 2. Perfusion study shows 4 mL region of acute ischemia in the left middle cerebral artery territory without core infarct or intracranial hemorrhage. 3. 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 Aortic Atherosclerosis (ICD10-I70.0). Electronically Signed   By: Ulyses Jarred M.D.   On: 01/13/2021 00:56   CT HEAD CODE STROKE WO CONTRAST  Result Date: 01/13/2021 CLINICAL DATA:  Code stroke.  Dizziness and right-sided weakness EXAM: CT HEAD WITHOUT CONTRAST TECHNIQUE: Contiguous axial images were obtained from the base of the skull through the vertex without intravenous contrast.  COMPARISON:  None. FINDINGS: Brain: There is no mass, hemorrhage or extra-axial collection. There is generalized atrophy without lobar predilection. Areas of hypoattenuation of the deep gray nuclei and confluent periventricular white matter hypodensity, consistent with chronic small vessel disease. Vascular: Atherosclerotic calcification of the internal carotid arteries at the skull base. No abnormal hyperdensity of the major intracranial arteries or dural venous sinuses. Skull: The visualized skull base, calvarium and extracranial soft tissues are normal. Sinuses/Orbits: No fluid levels or advanced mucosal thickening of the visualized paranasal sinuses. No mastoid or middle ear effusion. The orbits are normal. ASPECTS [facility] Stroke Program Early CT Score) - Ganglionic level infarction (caudate, lentiform nuclei, internal capsule, insula, M1-M3 cortex): 7 - Supraganglionic infarction (M4-M6 cortex): 3 Total score (0-10 with 10 being normal): 10 IMPRESSION: 1. No acute intracranial abnormality. 2. ASPECTS is 10. 3. 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 These results were called by telephone at the time of interpretation on 01/13/2021 at 12:14 am to provider [facility] , who verbally acknowledged these results. Electronically Signed   By: Ulyses Jarred M.D.   On: 01/13/2021 00:16     ____________________________________________   PROCEDURES  Procedure(s) performed:yes .1-3 Lead EKG Interpretation Performed by: Rudene Re, MD Authorized by: Rudene Re, MD     Interpretation: normal     ECG rate assessment: normal     Rhythm: sinus rhythm     Ectopy: none     Critical Care performed: yes  CRITICAL CARE Performed by: Rudene Re  ?  Total critical care time: 35 min  Critical care time was exclusive of separately billable procedures and treating other patients.  Critical care was necessary to treat or prevent imminent or life-threatening  deterioration.  Critical care was time spent personally by me on the following activities: development of treatment plan with patient and/or surrogate as well as nursing, discussions with consultants, evaluation of patient's response to treatment, examination of patient, obtaining history from patient or surrogate, ordering and performing treatments and interventions, ordering and review of laboratory studies, ordering and review of radiographic studies, pulse oximetry and re-evaluation of patient's condition.  ____________________________________________   INITIAL IMPRESSION / ASSESSMENT AND PLAN / ED COURSE   80 y.o. male with a history of remote colon, stomach and prostate cancer who presents for evaluation of right-sided facial droop and right upper extremity weakness.  Patient arrives with presentation consistent with an acute stroke.  NIH stroke scale 4 on arrival.  Exam as listed above.  Patient was made a code stroke.  Teleneurology evaluated patient and recommended TPA.  TPA risks and benefits were discussed by the tele neurologist and patient agreed to tPA which was recommended by the neurologist. Orders for tPA and post-tPA administration placed by neurologist however they appear in [place] as it they were ordered by me. Tele neurology team unable to correct this mistake. Orders were kept as they were due to time urgency for tPA administration. Patient discussed with ICU NP who will admit patient.  CT head visualized by me with no signs of hemorrhagic stroke or any abnormalities, confirmed by radiology.  Spoke with the radiologist on the phone.  EKG with no signs of dysrhythmias.  Blood work with no acute significant abnormalities.  CT angio was also performed which showed no emergent large vessel occlusion but did show a 4 mm region of acute ischemia in the left middle cerebral artery territory without core infarct or hemorrhage.  Patient placed on telemetry for monitoring of cardiorespiratory  status      _____________________________________________ Please note:  Patient was evaluated in Emergency Department today for the symptoms described in the history of present illness. Patient was evaluated in the context of the global COVID-19 pandemic, which necessitated consideration that the patient might be at risk for infection with the SARS-CoV-2 virus that causes COVID-19. Institutional protocols and algorithms that pertain to the evaluation of patients at risk for COVID-19 are in a state of rapid change based on information released by regulatory bodies including the CDC and federal and state organizations. These policies and algorithms were followed during the patient's care in the ED.  Some ED evaluations and interventions may be delayed as a result of limited staffing during the pandemic.   [place] Controlled Substance Database was reviewed by me. ____________________________________________   FINAL CLINICAL IMPRESSION(S) / ED DIAGNOSES   Final diagnoses:  Cerebrovascular accident (CVA), unspecified mechanism ([place])      NEW MEDICATIONS STARTED DURING THIS VISIT:  ED Discharge Orders    None       Note:  This document was prepared using Dragon voice recognition software and may include unintentional dictation errors.    Rudene Re, MD 01/13/21 [phone]

## 2021-01-14 ENCOUNTER — Inpatient Hospital Stay: Payer: Medicare PPO

## 2021-01-14 DIAGNOSIS — I639 Cerebral infarction, unspecified: Secondary | ICD-10-CM

## 2021-01-14 DIAGNOSIS — Z9282 Status post administration of tPA (rtPA) in a different facility within the last 24 hours prior to admission to current facility: Secondary | ICD-10-CM

## 2021-01-14 LAB — HEMOGLOBIN A1C
Hgb A1c MFr Bld: 6 % — ABNORMAL HIGH (ref 4.8–5.6)
Mean Plasma Glucose: 126 mg/dL

## 2021-01-14 MED ORDER — CLOPIDOGREL BISULFATE 75 MG PO TABS
75.0000 mg | ORAL_TABLET | Freq: Every day | ORAL | Status: DC
  Administered 2021-01-14 – 2021-01-15 (×2): 75 mg via ORAL
  Filled 2021-01-14 (×2): qty 1

## 2021-01-14 MED ORDER — ASPIRIN EC 81 MG PO TBEC
81.0000 mg | DELAYED_RELEASE_TABLET | Freq: Every day | ORAL | Status: DC
  Administered 2021-01-14 – 2021-01-15 (×2): 81 mg via ORAL
  Filled 2021-01-14 (×2): qty 1

## 2021-01-14 NOTE — Evaluation (Signed)
Physical Therapy Evaluation Patient Details Name: Bradley Rivera MRN: [id number] DOB: 1941-04-21 Today's Date: 01/14/2021   History of Present Illness  Pt is a 80 y/o M who presented to ED on 01/12/21 with c/o dizziness, RUE weakness, slurred speech & R sided facial droop. Code stroke initiated & tPA administered. CTA Head revealed no acute intracranial abnormality, however ASPECTS was 10.  CT Cerebral Perfusion Study revealed no emergent large vessel occlusion or high-grade stenosis of the intracranial arteries, however there was a 4 ml region of acute ischemia in the left middle cerebral artery territory without core infarct or intracranial hemorrhage. PMH: stomach, colon, & prostate CA  Clinical Impression  Pt seen for evaluation with co-tx with OT. Pt mobilizes well, requiring as little as min assist without AD for long distance ambulation but with impaired gait pattern as noted below. Pt's primary deficits lie in impaired neuromuscular use of RUE and impaired balance during gait. Pt would benefit from OPPT f/u to address deficits noted but does not have transportation there, so recommending HHPT f/u instead.    Follow Up Recommendations Home health PT;Supervision for mobility/OOB    Equipment Recommendations  Rolling walker with 5" wheels    Recommendations for Other Services       Precautions / Restrictions Precautions Precautions: Fall Restrictions Weight Bearing Restrictions: No      Mobility  Bed Mobility Overal bed mobility: Needs Assistance Bed Mobility: Supine to Sit;Sit to Supine     Supine to sit: Supervision;HOB elevated Sit to supine: Supervision;HOB elevated        Transfers Overall transfer level: Needs assistance Equipment used: 1 person hand held assist;None Transfers: Sit to/from Stand Sit to Stand: Min guard            Ambulation/Gait Ambulation/Gait assistance: Min assist Gait Distance (Feet): 200 Feet (+ 200 ft) Assistive device: 1 person  hand held assist;None (initial bout of 200 ft with LUE HHA, 2nd trial of 200 ft no AD) Gait Pattern/deviations: Decreased step length - right;Decreased step length - left;Decreased stride length;Decreased dorsiflexion - right;Decreased dorsiflexion - left;Wide base of support Gait velocity: decreased      Stairs            Wheelchair Mobility    Modified Rankin (Stroke Patients Only)       Balance Overall balance assessment: Needs assistance Sitting-balance support: No upper extremity supported;Feet supported Sitting balance-Leahy Scale: Good     Standing balance support: Single extremity supported;No upper extremity supported Standing balance-Leahy Scale: Fair                               Pertinent Vitals/Pain Pain Assessment: No/denies pain    Home Living Family/patient expects to be discharged to:: Private residence Living Arrangements: Spouse/significant other Available Help at Discharge: Family;Available PRN/intermittently Type of Home: House Home Access: Stairs to enter Entrance Stairs-Rails: None Entrance Stairs-Number of Steps: 1 Home Layout: One level Home Equipment: Shower seat Additional Comments: wife does not drive    Prior Function Level of Independence: Independent         Comments: Pt reports no AD for mobility or I/ADLs, uses shower seat PRN, drives     Hand Dominance   Dominant Hand: Right    Extremity/Trunk Assessment   Upper Extremity Assessment Upper Extremity Assessment: RUE deficits/detail RUE Deficits / Details: Shoulder flexion 3/5. Wist flexion/extension 3-/5. Digit flexion 3-/5. Digit extension 2-/5. Grip 3/5. unable to achieve 5 finger  opposition. RAM decreased speed and coordination    Lower Extremity Assessment Lower Extremity Assessment: Overall WFL for tasks assessed;Generalized weakness (BLE heel to shin equal, BLE proprioception & sensation (to light touch) intact & equal)       Communication    Communication: No difficulties  Cognition Arousal/Alertness: Awake/alert Behavior During Therapy: WFL for tasks assessed/performed Overall Cognitive Status: Within Functional Limits for tasks assessed                                        General Comments General comments (skin integrity, edema, etc.): reports dizziness with initial standing, BP WNL, resolved with standing rest    Exercises Other Exercises Other Exercises: Pt educated re: OT role, DME recs, d/c recs, fall prevention, ECS, stroke education, HEP Other Exercises: LBD,toileting, sup<>sit, sit<>stand, sitting/standing balance/tolerance, ~200 ft mobility   Assessment/Plan    PT Assessment Patient needs continued PT services  PT Problem List Decreased strength;Decreased mobility;Decreased range of motion;Decreased coordination;Decreased activity tolerance;Cardiopulmonary status limiting activity;Decreased balance       PT Treatment Interventions DME instruction;Therapeutic activities;Gait training;Therapeutic exercise;Patient/family education;Stair training;Balance training;Functional mobility training;Neuromuscular re-education    PT Goals (Current goals can be found in the Care Plan section)  Acute Rehab PT Goals Patient Stated Goal: To return to PLOF PT Goal Formulation: With patient Time For Goal Achievement: 01/28/21 Potential to Achieve Goals: Good    Frequency 7X/week   Barriers to discharge        Co-evaluation PT/OT/SLP Co-Evaluation/Treatment: Yes Reason for Co-Treatment: To address functional/ADL transfers PT goals addressed during session: Mobility/safety with mobility;Balance;Strengthening/ROM OT goals addressed during session: ADL's and self-care;Strengthening/ROM       AM-PAC PT "6 Clicks" Mobility  Outcome Measure Help needed turning from your back to your side while in a flat bed without using bedrails?: None Help needed moving from lying on your back to sitting on the side  of a flat bed without using bedrails?: A Little Help needed moving to and from a bed to a chair (including a wheelchair)?: A Little Help needed standing up from a chair using your arms (e.g., wheelchair or bedside chair)?: A Little Help needed to walk in hospital room?: A Little Help needed climbing 3-5 steps with a railing? : A Little 6 Click Score: 19    End of Session Equipment Utilized During Treatment: Gait belt Activity Tolerance: Patient tolerated treatment well Patient left: in bed (in handoff to OT) Nurse Communication: Mobility status PT Visit Diagnosis: Unsteadiness on feet (R26.81);Difficulty in walking, not elsewhere classified (R26.2);Hemiplegia and hemiparesis;Muscle weakness (generalized) (M62.81) Hemiplegia - Right/Left: Right Hemiplegia - dominant/non-dominant: Dominant Hemiplegia - caused by: Cerebral infarction    Time: 0900-0930 PT Time Calculation (min) (ACUTE ONLY): 30 min   Charges:   PT Evaluation $PT Eval Low Complexity: 1 Low PT Treatments $Therapeutic Activity: 8-22 mins        Lavone Nian, PT, DPT 01/14/21, 1:38 PM   Waunita Schooner 01/14/2021, 1:36 PM

## 2021-01-14 NOTE — Progress Notes (Signed)
NEUROLOGY CONSULTATION PROGRESS NOTE   Date of service: January 14, 2021 Patient Name: Bradley Rivera MRN:  [id number] DOB:  09/16/1941  Brief HPI  80 year old man with hx of stomach and prostate cancer presenting for sudden onset of right-sided weakness, slurred speech and dysarthria, within the window for IV TPA, risks and benefits discussed by telemedicine neurology and IV TPA given at 01/13/21. CTH with no IC. 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 CTP with a 4cc penumbra.  Interval Hx   MRI Brain is pending. Symptoms significantly improved with resolution of R facial droop and feels like his R arm is much better. Post tPA CTH was negative and he has been started on Aspirin 81mg  enteric coated along with Plavix 75mg  daily.  Vitals   Vitals:   01/14/21 0900 01/14/21 1000 01/14/21 1100 01/14/21 1200  BP: 116/62 119/70  120/75  Pulse: 70 65    Resp: 19 14 (!) 23   Temp:    98.6 F (37 C)  TempSrc:      SpO2: 100% 100%  100%  Weight:      Height:         Body mass index is 19.6 kg/m.  Physical Exam   General: Laying comfortably in bed; in no acute distress. HENT: Normal oropharynx and mucosa. Normal external appearance of ears and nose.  Neck: Supple, no pain or tenderness  CV: No JVD. No peripheral edema.  Pulmonary: Symmetric Chest rise. Normal respiratory effort.  Abdomen: Soft to touch, non-tender.  Ext: No cyanosis, edema, or deformity  Skin: No rash. Normal palpation of skin.   Musculoskeletal: Normal digits and nails by inspection. No clubbing.   Neurologic Examination  Mental status/Cognition: Alert, oriented to self, place, month and year, good attention.  Speech/language: Fluent, comprehension intact, object naming intact, repetition intact.  Cranial nerves:   CN II Pupils equal and reactive to light, no VF deficits    CN III,IV,VI EOM intact, no gaze preference or deviation, no nystagmus    CN V  normal sensation in V1, V2, and V3 segments bilaterally    CN VII no asymmetry, no nasolabial fold flattening    CN VIII normal hearing to speech    CN IX & X normal palatal elevation, no uvular deviation    CN XI 5/5 head turn and 5/5 shoulder shrug bilaterally    CN XII midline tongue protrusion    Motor:  Muscle bulk: poor, tone normal, pronator drift Yes in RUE tremor none Mvmt Root Nerve  Muscle Right Left Comments  SA C5/6 Ax Deltoid 5 5   EF C5/6 Mc Biceps 5 5   EE C6/7/8 Rad Triceps 5 5   WF C6/7 Med FCR 4 5   WE C7/8 PIN ECU 5 5   F Ab C8/T1 U ADM/FDI 4 5   HF L1/2/3 Fem Illopsoas 5 5   KE L2/3/4 Fem Quad 5 5   DF L4/5 D Peron Tib Ant 5 5   PF S1/2 Tibial Grc/Sol 5 5    Reflexes:  Right Left Comments  Pectoralis      Biceps (C5/6) 2 2   Brachioradialis (C5/6) 2 2    Triceps (C6/7) 2 2    Patellar (L3/4) 2 2    Achilles (S1) 1 1    Hoffman      Plantar     Jaw jerk    Sensation:  Light touch  Intact throughout   Pin prick    Temperature    Vibration   Proprioception    Coordination/Complex Motor:  - Finger to Nose with poor coordination in RUE - Heel to shin intact BL - Rapid alternating movement slowed in RUE - Gait: Deferred.  Labs   Basic Metabolic Panel:  Lab Results  Component Value Date   NA 139 01/13/2021   K 4.2 01/13/2021   CO2 27 01/13/2021   GLUCOSE 93 01/13/2021   BUN 14 01/13/2021   CREATININE 1.00 01/13/2021   CALCIUM 8.9 01/13/2021   GFRNONAA >60 01/13/2021   GFRAA 56 (L) 05/02/2019   HbA1c:  Lab Results  Component Value Date   HGBA1C 6.0 (H) 01/13/2021   LDL:  Lab Results  Component Value Date   LDLCALC 70 01/13/2021   Urine Drug Screen: No results found for: LABOPIA, COCAINSCRNUR, LABBENZ, AMPHETMU, THCU, LABBARB  Alcohol Level     Component Value Date/Time   ETH <10 01/12/2021 2350   No results found for: PHENYTOIN, ZONISAMIDE, LAMOTRIGINE, LEVETIRACETA No results found for: PHENYTOIN, PHENOBARB, VALPROATE,  CBMZ  Imaging and Diagnostic studies  Results for orders placed during the hospital encounter of 01/12/21  CT HEAD WO CONTRAST CTH was negative for a large hypodensity concerning for a large territory infarct or hyperdensity concerning for an ICH  Repeat 24 Hours Post tPA CTH: CTH was negative for a large hypodensity concerning for a large territory infarct or hyperdensity concerning for an ICH  CT Angio head and neck: 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 Aortic arch ather vs soft plaque.  CTP: 4cc of mismatch in L MCA [place].  Impression   Bradley Rivera is a 80 y.o. male with PMH significant for stomach and prostate cancer presenting for sudden onset of right-sided weakness, slurred speech and dysarthria, within the window for IV TPA, risks and benefits discussed by telemedicine neurology and IV TPA given at 01/13/21. His neurologic examination is notable for RUE weakness but improved compared to prior exams. Post tPA CTH was stable with no ICH and started on DAPT.  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  Recommendations  - Continue Aspirin 81mg (enteric coated) along with Plavix 75mg  daily x 30 days, then plavix 75mg  daily alone. 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 We have spoken with interventional neuroradiologist at [facility], and he will be scheduled for an outpatient diagnostic angio/stenting sometime next week.   ______________________________________________________________________   Thank you for the opportunity to take part in the care of this patient. If you have any further questions, please contact the neurology consultation attending.  Signed,  [place] Pager Number [phone]

## 2021-01-14 NOTE — Evaluation (Signed)
Occupational Therapy Evaluation Patient Details Name: Bradley Rivera MRN: [id number] DOB: 01/12/1941 Today's Date: 01/14/2021    History of Present Illness Pt is a 80 y/o M who presented to ED on 01/12/21 with c/o dizziness, RUE weakness, slurred speech & R sided facial droop. Code stroke initiated & tPA administered. CTA Head revealed no acute intracranial abnormality, however ASPECTS was 10.  CT Cerebral Perfusion Study revealed no emergent large vessel occlusion or high-grade stenosis of the intracranial arteries, however there was a 4 ml region of acute ischemia in the left middle cerebral artery territory without core infarct or intracranial hemorrhage. PMH: stomach, colon, & prostate CA   Clinical Impression   Bradley Rivera was seen for OT/PT co-evaluation this date. Prior to hospital admission, pt was Independent for I/ADLs and mobility including driving. Pt lives with wife in home c 1 STE - reports wife does not drive. Currently pt demonstrates significant impairments in RUE strength and coordination. Pt is R hand dominant and requires MIN A doff B socks seated EOB using LUE only, MAX A don B socks. CGA + SETUP for toileting c urinal seated EOB - LUE only. MOD A open toothpaste. CGA + HHA for ADL t/f. Grossly 2-/5 R wrist/digit extension, unable to achieve 5 finger opposition and significantly increased time + LUE assist to grasp OT hand with RUE.   Pt educated on safe positioning of RUE in order to promote functional return, improve safety, and promote skin integrity on this date. Pt was motivated t/o OT session and return demonstrated AROM exercises with VCs for technique. No cognitive or visual deficits noted with assessment on this date. Pt would benefit from skilled OT to address noted impairments and functional limitations (see below for any additional details) in order to maximize safety and independence while minimizing falls risk and caregiver burden. Upon hospital discharge, recommend  HHOT to maximize pt safety and return to functional independence during meaningful occupations of daily life.     Follow Up Recommendations  Home health OT    Equipment Recommendations  None recommended by OT    Recommendations for Other Services       Precautions / Restrictions Precautions Precautions: Fall Restrictions Weight Bearing Restrictions: No      Mobility Bed Mobility Overal bed mobility: Needs Assistance Bed Mobility: Supine to Sit;Sit to Supine     Supine to sit: Supervision Sit to supine: Supervision        Transfers Overall transfer level: Needs assistance Equipment used: 1 person hand held assist Transfers: Sit to/from Stand Sit to Stand: Min guard              Balance Overall balance assessment: Needs assistance Sitting-balance support: No upper extremity supported;Feet supported Sitting balance-Leahy Scale: Good     Standing balance support: Single extremity supported Standing balance-Leahy Scale: Fair                             ADL either performed or assessed with clinical judgement   ADL Overall ADL's : Needs assistance/impaired                                       General ADL Comments: MIN A doff B socks seated EOB using LUE only, MAX A don B socks. CGA + SETUP for toileting w/ urinal seated EOB. MOD A open toothpaste. CGA +  HHA for ADL t/f                        Hand Dominance Right   Extremity/Trunk Assessment Upper Extremity Assessment Upper Extremity Assessment: RUE deficits/detail RUE Deficits / Details: Shoulder flexion 3/5. Wist flexion/extension 3-/5. Digit flexion 3-/5. Digit extension 2-/5. Grip 3/5. unable to achieve 5 finger opposition. RAM decreased speed and coordination   Lower Extremity Assessment Lower Extremity Assessment: Overall WFL for tasks assessed       Communication Communication Communication: No difficulties   Cognition Arousal/Alertness:  Awake/alert Behavior During Therapy: WFL for tasks assessed/performed Overall Cognitive Status: Within Functional Limits for tasks assessed                                     General Comments  reports dizziness with initial standing - BP WNL, resolved with standing rest    Exercises Exercises: Other exercises Other Exercises Other Exercises: Pt educated re: OT role, DME recs, d/c recs, fall prevention, ECS, stroke education, HEP Other Exercises: LBD,toileting, sup<>sit, sit<>stand, sitting/standing balance/tolerance, ~200 ft mobility   Shoulder Instructions      Home Living Family/patient expects to be discharged to:: Private residence Living Arrangements: Spouse/significant other Available Help at Discharge: Family;Available PRN/intermittently Type of Home: House Home Access: Stairs to enter Entrance Stairs-Number of Steps: 1   Home Layout: One level     Bathroom Shower/Tub: Tub/shower unit;Walk-in shower         Home Equipment: Shower seat   Additional Comments: wife does not drive      Prior Functioning/Environment Level of Independence: Independent        Comments: Pt reports no AD for mobility or I/ADLs, uses shower seat PRN, drives        OT Problem List: Decreased strength;Decreased range of motion;Decreased activity tolerance;Impaired balance (sitting and/or standing);Impaired UE functional use      OT Treatment/Interventions: Self-care/ADL training;Therapeutic exercise;Energy conservation;DME and/or AE instruction;Therapeutic activities;Balance training;Patient/family education    OT Goals(Current goals can be found in the care plan section) Acute Rehab OT Goals Patient Stated Goal: To return to PLOF OT Goal Formulation: With patient Time For Goal Achievement: 01/28/21 Potential to Achieve Goals: Good ADL Goals Pt Will Perform Grooming: standing;Independently Pt Will Perform Lower Body Dressing: with modified independence;sit to/from  stand;with adaptive equipment Pt Will Transfer to Toilet: Independently;ambulating;regular height toilet Pt/caregiver will Perform Home Exercise Program: Increased ROM;Increased strength;Right Upper extremity;With written HEP provided;With theraputty  OT Frequency: Min 2X/week           Co-evaluation PT/OT/SLP Co-Evaluation/Treatment: Yes Reason for Co-Treatment: To address functional/ADL transfers PT goals addressed during session: Mobility/safety with mobility;Balance;Proper use of DME OT goals addressed during session: ADL's and self-care;Strengthening/ROM      AM-PAC OT "6 Clicks" Daily Activity     Outcome Measure Help from another person eating meals?: A Little Help from another person taking care of personal grooming?: A Little Help from another person toileting, which includes using toliet, bedpan, or urinal?: A Little Help from another person bathing (including washing, rinsing, drying)?: A Lot Help from another person to put on and taking off regular upper body clothing?: A Lot Help from another person to put on and taking off regular lower body clothing?: A Lot 6 Click Score: 15   End of Session Equipment Utilized During Treatment: Gait belt Nurse Communication: Mobility status  Activity Tolerance: Patient  tolerated treatment well Patient left: in bed;with call bell/phone within reach  OT Visit Diagnosis: Other abnormalities of gait and mobility (R26.89);Hemiplegia and hemiparesis Hemiplegia - Right/Left: Right Hemiplegia - dominant/non-dominant: Dominant Hemiplegia - caused by: Nontraumatic intracerebral hemorrhage                Time: 4037-0964 OT Time Calculation (min): 39 min Charges:  OT General Charges $OT Visit: 1 Visit OT Evaluation $OT Eval Moderate Complexity: 1 Mod OT Treatments $Self Care/Home Management : 8-22 mins  Dessie Coma, M.S. OTR/L  01/14/21, 10:48 AM  ascom [phone]

## 2021-01-14 NOTE — Progress Notes (Signed)
Report called to [facility] on 1C.

## 2021-01-14 NOTE — Progress Notes (Signed)
Right hand grip steadily improving. Up to [facility] to have BM. Speech clear. Moving to regular floor bed.

## 2021-01-14 NOTE — Progress Notes (Signed)
NAME:  Bradley Rivera, MRN:  [id number], DOB:  1941-05-02, LOS: 1 ADMISSION DATE:  01/12/2021, INITIAL CONSULTATION DATE: 01/13/2021 REFERRING MD: Dr. Alfred Levins, CHIEF COMPLAINT: Right Sided Weakness  Brief History:  80 yo male admitted with acute CVA s/p tPA   History of Present Illness:  This is a 80 yo male who presented to [facility] ER on 02/16 with c/o dizziness, right upper extremity weakness, slurred speech, and right sided facial droop onset 2230 on 02/16.  Per ER notes upon arrival to the ER pts NIH stroke scale score was 4, therefore code stroke initiated.  Tele-neurology evaluated pt and recommended tPA.  CTA Head revealed no acute intracranial abnormality, however ASPECTS was 10.  CT Cerebral Perfusion Study revealed no emergent large vessel occlusion or high-grade stenosis of the intracranial arteries, however there was a 4 ml region of acute ischemia in the left middle cerebral artery territory without core infarct or intracranial hemorrhage.  PCCM team contacted for ICU admission post tPA administration.   Past Medical History:  Remote Stomach, Colon and Prostate Cancer   Significant Hospital Events:  02/17: Pt admitted to ICU post tPA  Consults:  Intensivist  Neurology   Procedures:  None   Significant Diagnostic Tests:  CT Head Code Stroke 02/17>>No acute intracranial abnormality. ASPECTS is 10. 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 CTA Head Neck and Cerebral Perfusion 02/17>>No emergent large vessel occlusion or high-grade stenosis of the intracranial arteries. Perfusion study shows 4 mL region of acute ischemia in the left middle cerebral artery territory without core infarct or intracranial hemorrhage. 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 CT head without contrast 02/18>> no hemorrhage, old left basal ganglia small vessel infarct.  Micro Data:  COVID-19/Influenza PCR 02/17>> negative  Antimicrobials:  None    Interim History / Subjective:  No complaints today, states he feels better.  Facial numbness subjectively  better.  Objective   Blood pressure 120/63, pulse 75, temperature 98.5 F (36.9 C), temperature source Oral, resp. rate 18, height 6\' 1"  (1.854 m), weight 67.4 kg, SpO2 99 %.        Intake/Output Summary (Last 24 hours) at 01/14/2021 1134 Last data filed at 01/14/2021 0600 Gross per 24 hour  Intake 60 ml  Output 600 ml  Net -540 ml   Filed Weights   01/12/21 2339 01/13/21 0417 01/13/21 0418  Weight: 64 kg 64 kg 67.4 kg   Examination: General: frail elderly male ambulating hallway with assistance of PT, NAD. HENT: supple, no JVD  Lungs: clear throughout, even, non labored  Cardiovascular: nsr, rrr, no R/G, 2+ radial 1+ distal pulses, no edema Abdomen: concave, +BS x4, soft, non distended, non tender Extremities: Mild residual RUE weakness, normal tone, moves all extremities  Neuro: alert and oriented, follows commands, PERRLA, mild right facial droop present, speech fluent.   Assessment & Plan:   Acute CVA s/p tPA Continuous telemetry monitoring  NIH screening per code stroke protocol  Passed swallowing screen No chemical VTE prophylaxis SCD's for VTE px  Start aspirin and Plavix Echo and [place] Bilateral Carotids pending Lipid panel, hemoglobin A1c pending, and urine drug screen pending  Continue outpatient atorvastatin  Allow for permissive hypertension  Repeat CT Head 24hrs post tPA shows no hemorrhage Will need MRI Brain, ordered Neurology consulted appreciate input  OT and PT evaluation in progress  Anemia without obvious acute blood loss Continue outpatient ferrous sulfate    Best practice (evaluated daily)  Diet: NPO for now  pending swallowing screen  Pain/Anxiety/Delirium protocol (if indicated): N/A VAP protocol (if indicated): N/A DVT prophylaxis: SCD's  GI prophylaxis: N/A Glucose control: N/A Mobility: Bedrest for now post tPA Disposition:  MedSurg with telemetry  Goals of Care:  Last date of multidisciplinary goals of care discussion: N/A Family and staff present: N/A Summary of discussion: Discussed plan of care with pt Follow up goals of care discussion due: 01/13/2021 Code Status: Full Code   Labs   CBC: Recent Labs  Lab 01/12/21 2350 01/13/21 0406  WBC 7.6 7.7  NEUTROABS 4.4  --   HGB 11.6* 11.9*  HCT 35.9* 35.5*  MCV 100.8* 98.6  PLT 167 010    Basic Metabolic Panel: Recent Labs  Lab 01/12/21 2350 01/13/21 0406  NA 140 139  K 4.2 4.2  CL 107 103  CO2 26 27  GLUCOSE 101* 93  BUN 14 14  CREATININE 1.06 1.00  CALCIUM 9.0 8.9  MG  --  1.8  PHOS  --  3.3   GFR: Estimated Creatinine Clearance: 57.1 mL/min (by C-G formula based on SCr of 1 mg/dL). Recent Labs  Lab 01/12/21 2350 01/13/21 0406  WBC 7.6 7.7    Liver Function Tests: Recent Labs  Lab 01/12/21 2350  AST 25  ALT 17  ALKPHOS 70  BILITOT 0.5  PROT 7.0  ALBUMIN 3.4*   No results for input(s): LIPASE, AMYLASE in the last 168 hours. No results for input(s): AMMONIA in the last 168 hours.  ABG No results found for: PHART, PCO2ART, PO2ART, HCO3, TCO2, ACIDBASEDEF, O2SAT   Coagulation Profile: Recent Labs  Lab 01/12/21 2350  INR 1.0    Cardiac Enzymes: No results for input(s): CKTOTAL, CKMB, CKMBINDEX, TROPONINI in the last 168 hours.  HbA1C: Hgb A1c MFr Bld  Date/Time Value Ref Range Status  01/13/2021 04:06 AM 6.0 (H) 4.8 - 5.6 % Final    Comment:    (NOTE)         Prediabetes: 5.7 - 6.4         Diabetes: >6.4         Glycemic control for adults with diabetes: <7.0     CBG: Recent Labs  Lab 01/13/21 0159  GLUCAP 90    Review of Systems: Positives in BOLD   Gen: Denies fever, chills, weight change, fatigue, night sweats HEENT: Denies blurred vision, double vision, hearing loss, tinnitus, sinus congestion, rhinorrhea, sore throat, neck stiffness, dysphagia PULM: Denies shortness of breath, cough, sputum  production, hemoptysis, wheezing CV: Denies chest pain, edema, orthopnea, paroxysmal nocturnal dyspnea, palpitations GI: Denies abdominal pain, nausea, vomiting, diarrhea, hematochezia, melena, constipation, change in bowel habits GU: Denies dysuria, hematuria, polyuria, oliguria, urethral discharge Endocrine: Denies hot or cold intolerance, polyuria, polyphagia or appetite change Derm: Denies rash, dry skin, scaling or peeling skin change Heme: Denies easy bruising, bleeding, bleeding gums Neuro: right sided facial droop, right upper extremity weakness, headache, numbness, weakness, slurred speech, loss of memory or consciousness   Past Medical History:  He,  has a past medical history of Cancer ([place]).   Surgical History:   Past Surgical History:  Procedure Laterality Date  . GASTRECTOMY    . HERNIA REPAIR       Social History:   reports that he has been smoking. He has never used smokeless tobacco.   Family History:  His family history is not on file.   Allergies Allergies  Allergen Reactions  . Lisinopril Swelling  . Bromfenac   . Nepafenac Other (See  Comments)  . Other Other (See Comments)  . Terazosin Palpitations and Other (See Comments)     Home Medications  Prior to Admission medications   Medication Sig Start Date End Date Taking? Authorizing Provider  acetaminophen (TYLENOL) 325 MG tablet Take 650 mg by mouth every 6 (six) hours as needed.   Yes [provider]  allopurinol (ZYLOPRIM) 300 MG tablet Take 300 mg by mouth daily. 01/04/21  Yes [provider]  atorvastatin (LIPITOR) 80 MG tablet Take 40 mg by mouth daily. 01/04/21  Yes [provider]  benzocaine (HURRICAINE) 20 % GEL Use as directed 1 application in the mouth or throat 4 (four) times daily as needed.   Yes [provider]  Calcium Citrate-Vitamin D (CITRUS CALCIUM/VITAMIN D) 200-250 MG-UNIT TABS Take 2 tablets by mouth 2 (two) times daily with a meal. 12/01/20  Yes  [provider]  Cholecalciferol 25 MCG (1000 UT) tablet TAKE TWO TABLETS BY MOUTH EVERY DAY - 50 MCG IS EQUAL TO 2000UNITS OF VITAMIN D 02/09/20  Yes [provider]  FEROSUL 325 (65 Fe) MG tablet Take 325 mg by mouth daily with breakfast. 01/04/21  Yes [provider]  Multiple Vitamin (MULTIVITAMIN WITH MINERALS) TABS tablet Take 1 tablet by mouth daily.   Yes [provider]  SENEXON-S 8.6-50 MG tablet Take 1 tablet by mouth daily. 01/04/21  Yes [provider]  tamsulosin (FLOMAX) 0.4 MG CAPS capsule Take 0.8 mg by mouth daily. 01/04/21  Yes [provider]  vitamin B-12 (CYANOCOBALAMIN) 1000 MCG tablet Take 1,000 mcg by mouth daily. 12/01/20  Yes [provider]  vitamin C (ASCORBIC ACID) 500 MG tablet Take 1,000 mg by mouth daily. 01/04/21  Yes [provider]     Critical care time: 35 minutes     Disciplinary rounds were performed with the ICU team.  Care coordination was performed with patient's bedside nurse.  Patient will be transferred to [facility] hospitalist service in the morning.  Renold Don, MD [place] [facility]   *This note was dictated using voice recognition software/Dragon.  Despite best efforts to proofread, errors can occur which can change the meaning.  Any change was purely unintentional.

## 2021-01-14 NOTE — Plan of Care (Signed)
Post TPA CT Head completed: 1. No hemorrhage. 2. Old left basal ganglia small vessel infarct.  NIHSS score this morning is 1, the only residual he has from the CVA is minor facial Palsy. See NIHSS  note.

## 2021-01-14 NOTE — Progress Notes (Signed)
Speech Language Pathology Treatment: Dysphagia  Patient Details Name: Bradley Rivera MRN: [id number] DOB: 07-20-1941 Today's Date: 01/14/2021 Time: 1430-1530 SLP Time Calculation (min) (ACUTE ONLY): 60 min  Assessment / Plan / Recommendation Clinical Impression  Pt seen for ongoing assessment of swallowing. He appears much improved today; verbally responsive w/ much clear speech, less Dysarthria and less R orofacial weakness noted. Pt is on RA; wbc wnl. tPA completed per NSG. Pt was eager to have "water" and was insistent he could feed himself w/ setup as the RUE remains weak(but improved it appears as well). Pt explained general aspiration precautions and agreed verbally to the need for following them especially sitting upright for all oral intake AND taking Small, Single Sips by CUP only. Pt assisted w/ midline positioning d/t weakness then given trials of thin liquids, ice chips, purees and soft solids. NO overt clinical s/s of aspiration were noted w/ any consistency; respiratory status remained calm and unlabored, vocal quality clear b/t trials, O2 sats 98%. Pt held own Cup when drinking following instructions for single, small sips slowly. NO straws were utiilized for better oral control. Pt encouraged to take breaks intermittently to use dry swallowing and time to regulate breathing if needed. Oral phase appeared grossly [facility] for bolus management, mastication, and timely A-P transfer for swallowing of purees and soft solids(moistened); oral clearing achieved w/ all consistencies w/ min time needed for full mastication and oral clearing of increased textured solids d/t Missing Dentition. Much improved swallowing status today. Pt seemed pleased.   Recommend upgrade to Dysphagia level 3 diet (mech soft) w/ gravies added to moisten foods; Thin liquids VIA CUP ONLY. Recommend general aspiration precautions; Pills Whole in Puree; tray setup and positioning assistance for meals. ST services will continue  to f/u w/ pt for toleration of diet and education as needed while admitted. NSG updated. Precautions posted at bedside.      HPI HPI: Pt is a 80 yo male who presented to [facility] ER on 02/16 with c/o dizziness, right upper extremity weakness, slurred speech, and right sided facial droop onset 2230 on 02/16.  Per ER notes upon arrival to the ER pts NIH stroke scale score was 4, therefore code stroke initiated.  Tele-neurology evaluated pt and recommended tPA.  CTA Head revealed no acute intracranial abnormality, however ASPECTS was 10.  CT Cerebral Perfusion Study revealed no emergent large vessel occlusion or high-grade stenosis of the intracranial arteries, however there was a 4 ml region of acute ischemia in the left middle cerebral artery territory without core infarct or intracranial hemorrhage.  PCCM team contacted for ICU admission post tPA administration.      SLP Plan  Continue with current plan of care       Recommendations  Diet recommendations: Dysphagia 3 (mechanical soft);Thin liquid (meats Chopped) Liquids provided via: Cup;No straw Medication Administration: Whole meds with puree (for safer swallowing at this time) Supervision: Patient able to self feed;Intermittent supervision to cue for compensatory strategies (setup support) Compensations: Minimize environmental distractions;Slow rate;Small sips/bites;Lingual sweep for clearance of pocketing;Multiple dry swallows after each bite/sip;Follow solids with liquid Postural Changes and/or Swallow Maneuvers: Seated upright 90 degrees;Upright 30-60 min after meal;Out of bed for meals                General recommendations:  (Dietician f/u) Oral Care Recommendations: Oral care BID;Oral care before and after PO;Staff/trained caregiver to provide oral care Follow up Recommendations: Inpatient Rehab SLP Visit Diagnosis: Dysphagia, oropharyngeal phase (R13.12) (Dysarthria) Plan: Continue with  current plan of care       [place], Rozel, CCC-SLP Speech Language Pathologist Rehab Services [phone] [facility] 01/14/2021, 5:01 PM

## 2021-01-15 DIAGNOSIS — Z87438 Personal history of other diseases of male genital organs: Secondary | ICD-10-CM

## 2021-01-15 LAB — TSH: TSH: 1.311 u[IU]/mL (ref 0.350–4.500)

## 2021-01-15 MED ORDER — IPRATROPIUM-ALBUTEROL 0.5-2.5 (3) MG/3ML IN SOLN: 3.0000 mL | RESPIRATORY_TRACT | Status: DC | PRN

## 2021-01-15 MED ORDER — CLOPIDOGREL BISULFATE 75 MG PO TABS: 75.0000 mg | ORAL_TABLET | Freq: Every day | ORAL | 0 refills | Status: DC

## 2021-01-15 MED ORDER — DM-GUAIFENESIN ER 30-600 MG PO TB12: 1.0000 | ORAL_TABLET | Freq: Two times a day (BID) | ORAL | Status: DC | PRN

## 2021-01-15 MED ORDER — ATORVASTATIN CALCIUM 40 MG PO TABS: 40.0000 mg | ORAL_TABLET | Freq: Every day | ORAL | 3 refills | Status: DC

## 2021-01-15 MED ORDER — SENNOSIDES-DOCUSATE SODIUM 8.6-50 MG PO TABS: 1.0000 | ORAL_TABLET | Freq: Every evening | ORAL | Status: DC | PRN

## 2021-01-15 MED ORDER — ASPIRIN 81 MG PO TBEC: 81.0000 mg | DELAYED_RELEASE_TABLET | Freq: Every day | ORAL | 0 refills | Status: DC

## 2021-01-15 MED ORDER — ACETAMINOPHEN 325 MG PO TABS: 650.0000 mg | ORAL_TABLET | Freq: Four times a day (QID) | ORAL | Status: DC | PRN

## 2021-01-15 NOTE — Discharge Summary (Signed)
Physician Discharge Summary  Bradley Rivera UGQ:[id number] DOB: 03/23/1941 DOA: 01/12/2021  PCP: [facility]  Admit date: 01/12/2021 Discharge date: 01/15/2021  Admitted From: Home Disposition: Home  Recommendations for Outpatient Follow-up:  1. Follow up with PCP in 1-2 weeks 2. Please obtain BMP/CBC in one week your next doctors visit.  3. Outpatient loop recorder placement to be arranged by cardiology team.  [facility] cardiology has been notified.  Neurology prefers this to be performed within 1 week of discharge 4. Outpatient follow-up with neurology and neuro interventional.  This will be arranged by their service 5. Aspirin and Plavix for 30 days followed by Plavix 6. Continue Lipitor  Discharge Condition: Stable CODE STATUS: Full code Diet recommendation: Heart healthy  Brief/Interim Summary: 80 year old with history of remote; and prostate cancer with complains of dizziness and right-sided upper extremity weakness, slurred speech and facial droop.  At the time of admission he met criteria for TPA which was given to him but the recommendations from neurology and patient was taken to the ICU.  CT of the head and CTA head and neck were overall negative but on CTA there was some perfusion defect noted in the left MCA territory.  Patient did well over 24 hours in the ICU and he was transferred out.  MRI of the brain confirmed posterior frontal left-sided CVA.  A1c was 6.0, LDL 70.  Case was discussed by onsite neurologist with neuro interventional icon who recommended outpatient loop recorder.  [facility], Dr Fletcher Anon was contacted by me to help arrange for this within 1 week of discharge. PT recommended home health.  Patient stable to be discharged today with outpatient follow-up recommendations as stated above  Body mass index is 19.6 kg/m.         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  Underwent TPA and was monitored in the ICU for 24 hours.  He did well.  A1c 6.0, LDL 70.  MRI confirmed positive CVA.  Initial CT head and neck showed some filling defect but no LVO therefore qualified for TPA by telemetry neurology.  Outpatient arrangement for loop recorder will be made by cardiology team as mentioned above.  Neurology to make arrangements for appropriate follow-up with neurology and neuro interventional radiology.  Continue aspirin and Plavix for 30 days followed by Plavix.  Continue daily statin  History of BPH -Continue Flomax  Consultations:  Neurology  Subjective: Patient feels great, wishes to go home.  Discharge Exam: Vitals:   01/15/21 0439 01/15/21 0846  BP: 118/68 119/79  Pulse: 71 80  Resp: 16 16  Temp: 98.7 F (37.1 C) 97.8 F (36.6 C)  SpO2: 100% 100%   Vitals:   01/14/21 1502 01/14/21 2015 01/15/21 0439 01/15/21 0846  BP: (!) 148/82 127/73 118/68 119/79  Pulse: 65 66 71 80  Resp: _0 Temp: 98.1 F (36.7 C) 98.5 F (36.9 C) 98.7 F (37.1 C) 97.8 F (36.6 C)  TempSrc: Oral Oral Oral   SpO2: 100% 100% 100% 100%  Weight:      Height:        General: Pt is alert, awake, not in acute distress, bilateral temporal wasting Cardiovascular: RRR, S1/S2 +, no rubs, no gallops Respiratory: CTA bilaterally, no wheezing, no rhonchi Abdominal: Soft, NT, ND, bowel sounds + Extremities: no edema, no cyanosis.  Right  upper extremity weakness is much better.  Today strength is 4+/5.  Discharge Instructions   Allergies as of 01/15/2021      Reactions   Lisinopril Swelling   Bromfenac    Nepafenac Other (See Comments)   Other Other (See Comments)   Asa [aspirin] Other (See Comments)   Per patient burning in stomach but he is willing  to try an enteric coated aspirin.     Terazosin Palpitations, Other (See Comments)      Medication List     TAKE these medications   acetaminophen 325 MG tablet Commonly known as: TYLENOL Take 650 mg by mouth every 6 (six) hours as needed.   allopurinol 300 MG tablet Commonly known as: ZYLOPRIM Take 300 mg by mouth daily.   aspirin 81 MG EC tablet Take 1 tablet (81 mg total) by mouth daily. Swallow whole.   atorvastatin 40 MG tablet Commonly known as: LIPITOR Take 1 tablet (40 mg total) by mouth daily. What changed: medication strength   benzocaine 20 % Gel Commonly known as: HURRICAINE Use as directed 1 application in the mouth or throat 4 (four) times daily as needed.   Cholecalciferol 25 MCG (1000 UT) tablet TAKE TWO TABLETS BY MOUTH EVERY DAY - 50 MCG IS EQUAL TO 2000UNITS OF VITAMIN D   Citrus Calcium/Vitamin D 200-250 MG-UNIT Tabs Take 2 tablets by mouth 2 (two) times daily with a meal.   clopidogrel 75 MG tablet Commonly known as: PLAVIX Take 1 tablet (75 mg total) by mouth daily.   FeroSul 325 (65 FE) MG tablet Generic drug: ferrous sulfate Take 325 mg by mouth daily with breakfast.   multivitamin with minerals Tabs tablet Take 1 tablet by mouth daily.   Senexon-S 8.6-50 MG tablet Generic drug: senna-docusate Take 1 tablet by mouth daily.   tamsulosin 0.4 MG Caps capsule Commonly known as: FLOMAX Take 0.8 mg by mouth daily.   vitamin B-12 1000 MCG tablet Commonly known as: CYANOCOBALAMIN Take 1,000 mcg by mouth daily.   vitamin C 500 MG tablet Commonly known as: ASCORBIC ACID Take 1,000 mg by mouth daily.       Follow-up [place]. Schedule an appointment as soon as possible for a visit in 1 week.   Specialty: General Practice Why: patient to make own follow up appointment Contact information: [address] [place] [address] [phone]              Allergies  Allergen Reactions  . Lisinopril Swelling  . Bromfenac   . Nepafenac Other (See Comments)  . Other Other (See Comments)  . Asa [Aspirin] Other (See  Comments)    Per patient burning in stomach but he is willing  to try an enteric coated aspirin.    . Terazosin Palpitations and Other (See Comments)    You were cared for by a hospitalist during your hospital stay. If you have any questions about your discharge medications or the care you received while you were in the hospital after you are discharged, you can call the unit and asked to speak with the hospitalist on call if the hospitalist that took care of you is not available. Once you are discharged, your primary care physician will handle any further medical issues. Please note that no refills for any discharge medications will be authorized once you are discharged, as it is imperative that you return to your primary care physician (or establish a relationship with a primary care physician  if you do not have one) for your aftercare needs so that they can reassess your need for medications and monitor your lab values.   Procedures/Studies: CT Angio Head W or Wo Contrast  Result Date: 01/13/2021 CLINICAL DATA:  Right-sided weakness EXAM: CT ANGIOGRAPHY HEAD AND NECK CT PERFUSION BRAIN TECHNIQUE: Multidetector CT imaging of the head and neck was performed using the standard protocol during bolus administration of intravenous contrast. Multiplanar CT image reconstructions and MIPs were obtained to evaluate the vascular anatomy. Carotid stenosis measurements (when applicable) are obtained utilizing NASCET criteria, using the distal internal carotid diameter as the denominator. Multiphase CT imaging of the brain was performed following IV bolus contrast injection. Subsequent parametric perfusion maps were calculated using RAPID software. CONTRAST:  179m OMNIPAQUE IOHEXOL 350 MG/ML SOLN COMPARISON:  None. FINDINGS: CTA NECK FINDINGS SKELETON: There is no bony spinal canal stenosis. No lytic or blastic lesion. OTHER NECK: Normal pharynx, larynx and major salivary glands. No cervical lymphadenopathy.  Unremarkable thyroid gland. UPPER CHEST: No pneumothorax or pleural effusion. No nodules or masses. AORTIC ARCH: There is moderate atherosclerosis of the aortic arch. There is no aneurysm, dissection or hemodynamically significant stenosis of the visualized portion of the aorta. Conventional 3 vessel aortic branching pattern. The visualized proximal subclavian arteries are widely patent. RIGHT CAROTID SYSTEM: Normal without aneurysm, dissection or stenosis. 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 No hemodynamically significant stenosis. VERTEBRAL ARTERIES: Left dominant configuration. 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 The left vertebral artery is normal along its entire course. CTA HEAD FINDINGS POSTERIOR CIRCULATION: --Vertebral arteries: Normal left.  The right terminates in PICA. --Inferior cerebellar arteries: Normal. --Basilar artery: Diminutive --Superior cerebellar arteries: Normal. --Posterior cerebral arteries (PCA): Normal. ANTERIOR CIRCULATION: --Intracranial internal carotid arteries: Normal. --Anterior cerebral arteries (ACA): Normal. Hypoplastic right A1 segment, normal variant. --Middle cerebral arteries (MCA): Normal. VENOUS SINUSES: As permitted by contrast timing, patent. ANATOMIC VARIANTS: Fetal origins of both posterior cerebral arteries. Review of the MIP images confirms the above findings. CT Brain Perfusion Findings: ASPECTS: 10 CBF (<30%) Volume: 029mPerfusion (Tmax>6.0s) volume: 74m38mismatch Volume: 74mL67mfarction Location:No infarct. 4 mL region of ischemia identified in the left middle cerebral artery territory. IMPRESSION: 1. No emergent large vessel occlusion or high-grade stenosis of the intracranial arteries. 2. Perfusion study shows 4 mL region of acute ischemia in the left middle cerebral artery territory without core infarct or intracranial hemorrhage. 3. 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 Aortic Atherosclerosis (ICD10-I70.0). Electronically Signed   By: KeviUlyses Jarred.   On: 01/13/2021 00:56   CT HEAD WO CONTRAST  Result Date: 01/14/2021 CLINICAL DATA:  Stroke follow-up 24 hours status post tPA EXAM: CT HEAD WITHOUT CONTRAST TECHNIQUE: Contiguous axial images were obtained from the base of the skull through the vertex without intravenous contrast. COMPARISON:  None. FINDINGS: Brain: There is no mass, hemorrhage or extra-axial collection. The size and configuration of the ventricles and extra-axial CSF spaces are normal. Old left basal ganglia small vessel infarct. Vascular: No abnormal hyperdensity of the major intracranial arteries or dural venous sinuses. No intracranial atherosclerosis. Skull: The visualized skull base, calvarium and extracranial soft tissues are normal. Sinuses/Orbits: No fluid levels or advanced mucosal thickening of the visualized paranasal sinuses. No mastoid or middle ear effusion. The orbits are normal. IMPRESSION: 1. No hemorrhage. 2. Old left basal ganglia small vessel infarct. Electronically Signed   By: KeviCletus Gash  On: 01/14/2021 02:01   CT Angio Neck W and/or Wo Contrast  Result Date: 01/13/2021 CLINICAL DATA:  Right-sided weakness EXAM: CT ANGIOGRAPHY HEAD AND NECK CT PERFUSION BRAIN TECHNIQUE: Multidetector CT imaging of the head and neck was performed using the standard protocol during bolus administration of intravenous contrast. Multiplanar CT image reconstructions and MIPs were obtained to evaluate the vascular anatomy. Carotid stenosis measurements (when applicable) are obtained utilizing NASCET criteria, using the distal internal carotid diameter as the denominator. Multiphase CT imaging of the brain was performed following IV bolus contrast injection. Subsequent parametric perfusion maps were calculated using RAPID software. CONTRAST:  166m OMNIPAQUE IOHEXOL 350 MG/ML SOLN COMPARISON:  None. FINDINGS:  CTA NECK FINDINGS SKELETON: There is no bony spinal canal stenosis. No lytic or blastic lesion. OTHER NECK: Normal pharynx, larynx and major salivary glands. No cervical lymphadenopathy. Unremarkable thyroid gland. UPPER CHEST: No pneumothorax or pleural effusion. No nodules or masses. AORTIC ARCH: There is moderate atherosclerosis of the aortic arch. There is no aneurysm, dissection or hemodynamically significant stenosis of the visualized portion of the aorta. Conventional 3 vessel aortic branching pattern. The visualized proximal subclavian arteries are widely patent. RIGHT CAROTID SYSTEM: Normal without aneurysm, dissection or stenosis. 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 No hemodynamically significant stenosis. VERTEBRAL ARTERIES: Left dominant configuration. 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 The left vertebral artery is normal along its entire course. CTA HEAD FINDINGS POSTERIOR CIRCULATION: --Vertebral arteries: Normal left.  The right terminates in PICA. --Inferior cerebellar arteries: Normal. --Basilar artery: Diminutive --Superior cerebellar arteries: Normal. --Posterior cerebral arteries (PCA): Normal. ANTERIOR CIRCULATION: --Intracranial internal carotid arteries: Normal. --Anterior cerebral arteries (ACA): Normal. Hypoplastic right A1 segment, normal variant. --Middle cerebral arteries (MCA): Normal. VENOUS SINUSES: As permitted by contrast timing, patent. ANATOMIC VARIANTS: Fetal origins of both posterior cerebral arteries. Review of the MIP images confirms the above findings. CT Brain Perfusion Findings: ASPECTS: 10 CBF (<30%) Volume: 080mPerfusion (Tmax>6.0s) volume: 40m43mismatch Volume: 40mL52mfarction Location:No infarct. 4 mL region of ischemia identified in the left middle cerebral artery territory. IMPRESSION: 1. No emergent large vessel occlusion or high-grade stenosis of the intracranial arteries. 2.  Perfusion study shows 4 mL region of acute ischemia in the left middle cerebral artery territory without core infarct or intracranial hemorrhage. 3. 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 Aortic Atherosclerosis (ICD10-I70.0). Electronically Signed   By: KeviUlyses Jarred.   On: 01/13/2021 00:56   MR BRAIN WO CONTRAST  Result Date: 01/14/2021 CLINICAL DATA:  Sudden onset of right-sided weakness, slurred speech and dysarthria. EXAM: MRI HEAD WITHOUT CONTRAST TECHNIQUE: Multiplanar, multiecho pulse sequences of the brain and surrounding structures were obtained without intravenous contrast. COMPARISON:  Head CT earlier today. CT studies yesterday. MRI 06/27/2017. FINDINGS: Brain: Diffusion imaging shows a 3 cm region of acute infarction at the site of the perfusion abnormality affecting the cortical and subcortical brain at the left posterior frontal lobe. No swelling or hemorrhage. No other acute infarction. Elsewhere, the brainstem appears normal. There are a few old small vessel cerebellar infarctions. Cerebral hemispheres elsewhere show mild chronic small-vessel change of the white matter, less than often seen at this age. No mass, hydrocephalus or extra-axial collection. Vascular: Major vessels at the base of the brain show flow. Skull and upper cervical spine: Negative Sinuses/Orbits: Clear/normal Other: None IMPRESSION: 1. 3 cm region of acute infarction affecting the cortical and subcortical brain at the site  of the perfusion abnormality in the left posterior frontal lobe. No swelling or hemorrhage. 2. Mild chronic small-vessel ischemic changes elsewhere throughout the brain, less than often seen at this age. Electronically Signed   By: Nelson Chimes M.D.   On: 01/14/2021 18:02   CT CEREBRAL PERFUSION W CONTRAST  Result Date: 01/13/2021 CLINICAL DATA:  Right-sided weakness EXAM: CT ANGIOGRAPHY HEAD AND NECK CT PERFUSION BRAIN TECHNIQUE: Multidetector CT imaging  of the head and neck was performed using the standard protocol during bolus administration of intravenous contrast. Multiplanar CT image reconstructions and MIPs were obtained to evaluate the vascular anatomy. Carotid stenosis measurements (when applicable) are obtained utilizing NASCET criteria, using the distal internal carotid diameter as the denominator. Multiphase CT imaging of the brain was performed following IV bolus contrast injection. Subsequent parametric perfusion maps were calculated using RAPID software. CONTRAST:  149m OMNIPAQUE IOHEXOL 350 MG/ML SOLN COMPARISON:  None. FINDINGS: CTA NECK FINDINGS SKELETON: There is no bony spinal canal stenosis. No lytic or blastic lesion. OTHER NECK: Normal pharynx, larynx and major salivary glands. No cervical lymphadenopathy. Unremarkable thyroid gland. UPPER CHEST: No pneumothorax or pleural effusion. No nodules or masses. AORTIC ARCH: There is moderate atherosclerosis of the aortic arch. There is no aneurysm, dissection or hemodynamically significant stenosis of the visualized portion of the aorta. Conventional 3 vessel aortic branching pattern. The visualized proximal subclavian arteries are widely patent. RIGHT CAROTID SYSTEM: Normal without aneurysm, dissection or stenosis. 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 No hemodynamically significant stenosis. VERTEBRAL ARTERIES: Left dominant configuration. 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 The left vertebral artery is normal along its entire course. CTA HEAD FINDINGS POSTERIOR CIRCULATION: --Vertebral arteries: Normal left.  The right terminates in PICA. --Inferior cerebellar arteries: Normal. --Basilar artery: Diminutive --Superior cerebellar arteries: Normal. --Posterior cerebral arteries (PCA): Normal. ANTERIOR CIRCULATION: --Intracranial internal carotid arteries: Normal. --Anterior cerebral arteries (ACA): Normal.  Hypoplastic right A1 segment, normal variant. --Middle cerebral arteries (MCA): Normal. VENOUS SINUSES: As permitted by contrast timing, patent. ANATOMIC VARIANTS: Fetal origins of both posterior cerebral arteries. Review of the MIP images confirms the above findings. CT Brain Perfusion Findings: ASPECTS: 10 CBF (<30%) Volume: 048mPerfusion (Tmax>6.0s) volume: 15m3mismatch Volume: 15mL60mfarction Location:No infarct. 4 mL region of ischemia identified in the left middle cerebral artery territory. IMPRESSION: 1. No emergent large vessel occlusion or high-grade stenosis of the intracranial arteries. 2. Perfusion study shows 4 mL region of acute ischemia in the left middle cerebral artery territory without core infarct or intracranial hemorrhage. 3. 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 Aortic Atherosclerosis (ICD10-I70.0). Electronically Signed   By: KeviUlyses Jarred.   On: 01/13/2021 00:56   CT HEAD CODE STROKE WO CONTRAST  Result Date: 01/13/2021 CLINICAL DATA:  Code stroke.  Dizziness and right-sided weakness EXAM: CT HEAD WITHOUT CONTRAST TECHNIQUE: Contiguous axial images were obtained from the base of the skull through the vertex without intravenous contrast. COMPARISON:  None. FINDINGS: Brain: There is no mass, hemorrhage or extra-axial collection. There is generalized atrophy without lobar predilection. Areas of hypoattenuation of the deep gray nuclei and confluent periventricular white matter hypodensity, consistent with chronic small vessel disease. Vascular: Atherosclerotic calcification of the internal carotid arteries at the skull base. No abnormal hyperdensity of the major intracranial arteries or dural venous sinuses. Skull: The visualized skull base, calvarium and extracranial soft tissues are normal. Sinuses/Orbits: No fluid levels or advanced mucosal thickening of  the visualized paranasal sinuses. No mastoid or middle ear effusion. The orbits are  normal. ASPECTS [facility] Stroke Program Early CT Score) - Ganglionic level infarction (caudate, lentiform nuclei, internal capsule, insula, M1-M3 cortex): 7 - Supraganglionic infarction (M4-M6 cortex): 3 Total score (0-10 with 10 being normal): 10 IMPRESSION: 1. No acute intracranial abnormality. 2. ASPECTS is 10. 3. 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 These results were called by telephone at the time of interpretation on 01/13/2021 at 12:14 am to provider [facility] , who verbally acknowledged these results. Electronically Signed   By: Ulyses Jarred M.D.   On: 01/13/2021 00:16      The results of significant diagnostics from this hospitalization (including imaging, microbiology, ancillary and laboratory) are listed below for reference.     Microbiology: Recent Results (from the past 240 hour(s))  Resp Panel by RT-PCR (Flu A&B, Covid) Nasopharyngeal Swab     Status: None   Collection Time: 01/12/21 11:50 PM   Specimen: Nasopharyngeal Swab; Nasopharyngeal(NP) swabs in vial transport medium  Result Value Ref Range Status   SARS Coronavirus 2 by RT PCR NEGATIVE NEGATIVE Final    Comment: (NOTE) SARS-CoV-2 target nucleic acids are NOT DETECTED.  The SARS-CoV-2 RNA is generally detectable in upper respiratory specimens during the acute phase of infection. The lowest concentration of SARS-CoV-2 viral copies this assay can detect is 138 copies/mL. A negative result does not preclude SARS-Cov-2 infection and should not be used as the sole basis for treatment or other patient management decisions. A negative result may occur with  improper specimen collection/handling, submission of specimen other than nasopharyngeal swab, presence of viral mutation(s) within the areas targeted by this assay, and inadequate number of viral copies(<138 copies/mL). A negative result must be combined with clinical observations, patient history, and epidemiological information. The expected  result is Negative.  Fact Sheet for Patients:  EntrepreneurPulse.com.au  Fact Sheet for Healthcare Providers:  IncredibleEmployment.be  This test is no t yet approved or cleared by the [place] FDA and  has been authorized for detection and/or diagnosis of SARS-CoV-2 by FDA under an Emergency Use Authorization (EUA). This EUA will remain  in effect (meaning this test can be used) for the duration of the COVID-19 declaration under Section 564(b)(1) of the Act, 21 U.S.C.section 360bbb-3(b)(1), unless the authorization is terminated  or revoked sooner.       Influenza A by PCR NEGATIVE NEGATIVE Final   Influenza B by PCR NEGATIVE NEGATIVE Final    Comment: (NOTE) The Xpert Xpress SARS-CoV-2/FLU/RSV plus assay is intended as an aid in the diagnosis of influenza from Nasopharyngeal swab specimens and should not be used as a sole basis for treatment. Nasal washings and aspirates are unacceptable for Xpert Xpress SARS-CoV-2/FLU/RSV testing.  Fact Sheet for Patients: EntrepreneurPulse.com.au  Fact Sheet for Healthcare Providers: IncredibleEmployment.be  This test is not yet approved or cleared by the [place] FDA and has been authorized for detection and/or diagnosis of SARS-CoV-2 by FDA under an Emergency Use Authorization (EUA). This EUA will remain in effect (meaning this test can be used) for the duration of the COVID-19 declaration under Section 564(b)(1) of the Act, 21 U.S.C. section 360bbb-3(b)(1), unless the authorization is terminated or revoked.  Performed at [facility], [address] [place], [place] [address]   MRSA PCR Screening     Status: None   Collection Time: 01/13/21  2:03 AM   Specimen: Nasopharyngeal  Result Value Ref Range Status   MRSA by PCR  NEGATIVE NEGATIVE Final    Comment:        The GeneXpert MRSA Assay (FDA approved for NASAL specimens only), is one  component of a comprehensive MRSA colonization surveillance program. It is not intended to diagnose MRSA infection nor to guide or monitor treatment for MRSA infections. Performed at [facility], [address] [place], [place] [address]      Labs: BNP (last 3 results) No results for input(s): BNP in the last 8760 hours. Basic Metabolic Panel: Recent Labs  Lab 01/12/21 2350 01/13/21 0406  NA 140 139  K 4.2 4.2  CL 107 103  CO2 26 27  GLUCOSE 101* 93  BUN 14 14  CREATININE 1.06 1.00  CALCIUM 9.0 8.9  MG  --  1.8  PHOS  --  3.3   Liver Function Tests: Recent Labs  Lab 01/12/21 2350  AST 25  ALT 17  ALKPHOS 70  BILITOT 0.5  PROT 7.0  ALBUMIN 3.4*   No results for input(s): LIPASE, AMYLASE in the last 168 hours. No results for input(s): AMMONIA in the last 168 hours. CBC: Recent Labs  Lab 01/12/21 2350 01/13/21 0406  WBC 7.6 7.7  NEUTROABS 4.4  --   HGB 11.6* 11.9*  HCT 35.9* 35.5*  MCV 100.8* 98.6  PLT 167 164   Cardiac Enzymes: No results for input(s): CKTOTAL, CKMB, CKMBINDEX, TROPONINI in the last 168 hours. BNP: Invalid input(s): POCBNP CBG: Recent Labs  Lab 01/13/21 0159  GLUCAP 90   D-Dimer No results for input(s): DDIMER in the last 72 hours. Hgb A1c Recent Labs    01/13/21 0406  HGBA1C 6.0*   Lipid Profile Recent Labs    01/13/21 0406  CHOL 140  HDL 61  LDLCALC 70  TRIG 47  CHOLHDL 2.3   Thyroid function studies Recent Labs    01/15/21 0901  TSH 1.311   Anemia work up No results for input(s): VITAMINB12, FOLATE, FERRITIN, TIBC, IRON, RETICCTPCT in the last 72 hours. Urinalysis No results found for: COLORURINE, APPEARANCEUR, [place], [place], GLUCOSEU, [place], [place], [place], PROTEINUR, UROBILINOGEN, NITRITE, LEUKOCYTESUR Sepsis Labs Invalid input(s): PROCALCITONIN,  WBC,  LACTICIDVEN Microbiology Recent Results (from the past 240 hour(s))  Resp Panel by RT-PCR (Flu A&B, Covid) Nasopharyngeal Swab      Status: None   Collection Time: 01/12/21 11:50 PM   Specimen: Nasopharyngeal Swab; Nasopharyngeal(NP) swabs in vial transport medium  Result Value Ref Range Status   SARS Coronavirus 2 by RT PCR NEGATIVE NEGATIVE Final    Comment: (NOTE) SARS-CoV-2 target nucleic acids are NOT DETECTED.  The SARS-CoV-2 RNA is generally detectable in upper respiratory specimens during the acute phase of infection. The lowest concentration of SARS-CoV-2 viral copies this assay can detect is 138 copies/mL. A negative result does not preclude SARS-Cov-2 infection and should not be used as the sole basis for treatment or other patient management decisions. A negative result may occur with  improper specimen collection/handling, submission of specimen other than nasopharyngeal swab, presence of viral mutation(s) within the areas targeted by this assay, and inadequate number of viral copies(<138 copies/mL). A negative result must be combined with clinical observations, patient history, and epidemiological information. The expected result is Negative.  Fact Sheet for Patients:  EntrepreneurPulse.com.au  Fact Sheet for Healthcare Providers:  IncredibleEmployment.be  This test is no t yet approved or cleared by the [place] FDA and  has been authorized for detection and/or diagnosis of SARS-CoV-2 by FDA under an Emergency Use Authorization (EUA). This  EUA will remain  in effect (meaning this test can be used) for the duration of the COVID-19 declaration under Section 564(b)(1) of the Act, 21 U.S.C.section 360bbb-3(b)(1), unless the authorization is terminated  or revoked sooner.       Influenza A by PCR NEGATIVE NEGATIVE Final   Influenza B by PCR NEGATIVE NEGATIVE Final    Comment: (NOTE) The Xpert Xpress SARS-CoV-2/FLU/RSV plus assay is intended as an aid in the diagnosis of influenza from Nasopharyngeal swab specimens and should not be used as a sole basis  for treatment. Nasal washings and aspirates are unacceptable for Xpert Xpress SARS-CoV-2/FLU/RSV testing.  Fact Sheet for Patients: EntrepreneurPulse.com.au  Fact Sheet for Healthcare Providers: IncredibleEmployment.be  This test is not yet approved or cleared by the [place] FDA and has been authorized for detection and/or diagnosis of SARS-CoV-2 by FDA under an Emergency Use Authorization (EUA). This EUA will remain in effect (meaning this test can be used) for the duration of the COVID-19 declaration under Section 564(b)(1) of the Act, 21 U.S.C. section 360bbb-3(b)(1), unless the authorization is terminated or revoked.  Performed at [facility], [address] [place], [place] [address]   MRSA PCR Screening     Status: None   Collection Time: 01/13/21  2:03 AM   Specimen: Nasopharyngeal  Result Value Ref Range Status   MRSA by PCR NEGATIVE NEGATIVE Final    Comment:        The GeneXpert MRSA Assay (FDA approved for NASAL specimens only), is one component of a comprehensive MRSA colonization surveillance program. It is not intended to diagnose MRSA infection nor to guide or monitor treatment for MRSA infections. Performed at [facility], [address] [place], [place] [address]      Time coordinating discharge:  I have spent 35 minutes face to face with the patient and on the ward discussing the patients care, assessment, plan and disposition with other care givers. >50% of the time was devoted counseling the patient about the risks and benefits of treatment/Discharge disposition and coordinating care.   SIGNED:   Damita Lack, MD  [facility] 01/15/2021, 11:01 AM   If 7PM-7AM, please contact night-coverage

## 2021-01-15 NOTE — Progress Notes (Signed)
Occupational Therapy Treatment Patient Details Name: Bradley Rivera MRN: [id number] DOB: Apr 03, 1941 Today's Date: 01/15/2021    History of present illness Pt is a 80 y/o M who presented to ED on 01/12/21 with c/o dizziness, RUE weakness, slurred speech & R sided facial droop. Code stroke initiated & tPA administered. CTA Head revealed no acute intracranial abnormality, however ASPECTS was 10.  CT Cerebral Perfusion Study revealed no emergent large vessel occlusion or high-grade stenosis of the intracranial arteries, however there was a 4 ml region of acute ischemia in the left middle cerebral artery territory without core infarct or intracranial hemorrhage. PMH: stomach, colon, & prostate CA   OT comments  Bradley Rivera was seen for OT treatment on this date. Upon arrival to room pt reclined in bed agreeable to tx. Pt reports he has been doing his exercises consistently. Demonstrates improved thumb AROM, digit flexion/extension, and pinch strength. Pt requires MIN A grooming standing sinkside - VCs for integrating dominant RUE. MIN VCs self-drinking seated EOC - cues to integrate RUE.   Pt performed hand strengthening with yellow (soft) theraputty, required cues for proper technique. Pt worked on gross grip, lateral pinch, gross digit extension, digit extension table spread, and thumb opposition. Pt required VCs for rest breaks and WBing through hand. Pt tolerated ~10 mins therapeutic activities using RUE to grasp tissue and remove from box. Pt required LUE assist to ball into fist and unable to achieve palm to finger translation. Improved throwing tissue balls across room over 3 trials.   Pt making good progress toward goals. Pt continues to benefit from skilled OT services to maximize return to PLOF and minimize risk of future falls, injury, caregiver burden, and readmission. Will continue to follow POC. Discharge recommendation remains appropriate.    Follow Up Recommendations  Home health OT (pt  wold beenfit from Outpatient OT, however wife does not drive, rec HHOT)    Equipment Recommendations  None recommended by OT    Recommendations for Other Services      Precautions / Restrictions Precautions Precautions: Fall Restrictions Weight Bearing Restrictions: No       Mobility Bed Mobility Overal bed mobility: Needs Assistance Bed Mobility: Supine to Sit     Supine to sit: Supervision;HOB elevated        Transfers Overall transfer level: Needs assistance Equipment used: None Transfers: Sit to/from Stand Sit to Stand: Supervision         General transfer comment: intermittent furniture walking with LUE    Balance Overall balance assessment: Needs assistance Sitting-balance support: No upper extremity supported;Feet supported Sitting balance-Leahy Scale: Good     Standing balance support: No upper extremity supported;During functional activity Standing balance-Leahy Scale: Fair                             ADL either performed or assessed with clinical judgement   ADL Overall ADL's : Needs assistance/impaired                                       General ADL Comments: MIN A grooming standing sinkside - VCs for integrating dominant RUE. MIN VCs self-drinking seated EOC - cues to integrate RUE               Cognition Arousal/Alertness: Awake/alert Behavior During Therapy: WFL for tasks assessed/performed Overall Cognitive Status: Within Functional Limits for tasks  assessed                                 General Comments: Pleasant and eager for education        Exercises Exercises: Other exercises;Hand exercises;Hand activities Hand Exercises Forearm Supination: AROM;Strengthening;Right;10 reps Forearm Pronation: AROM;Strengthening;Right;10 reps Wrist Flexion: AROM;Strengthening;Right;10 reps Wrist Extension: AROM;Strengthening;Right;10 reps Digit Composite Flexion: AROM;Strengthening;Right;10  reps Composite Extension: AROM;Strengthening;Right;10 reps Thumb Abduction: AROM;Strengthening;Right;10 reps Thumb Adduction: AROM;Strengthening;Right;10 reps Opposition: AROM;Strengthening;Right;10 reps Hand Activities Pick Up, Palm, Put Down: Right;10 reps;Seated Other Exercises Other Exercises: Pt educated re: OT role, DME recs, d/c recs, falls prevention, theraoutty exercises, stroke education Other Exercises: grooming, self-drinking, therex, sit<>stand, sitting/standing balance/toelrance           Pertinent Vitals/ Pain       Pain Assessment: No/denies pain         Frequency  Min 2X/week        Progress Toward Goals  OT Goals(current goals can now be found in the care plan section)     Acute Rehab OT Goals Patient Stated Goal: To return to PLOF OT Goal Formulation: With patient Time For Goal Achievement: 01/28/21 Potential to Achieve Goals: Good  Plan Discharge plan remains appropriate;Frequency remains appropriate       AM-PAC OT "6 Clicks" Daily Activity     Outcome Measure   Help from another person eating meals?: A Little Help from another person taking care of personal grooming?: A Little Help from another person toileting, which includes using toliet, bedpan, or urinal?: A Little Help from another person bathing (including washing, rinsing, drying)?: A Little Help from another person to put on and taking off regular upper body clothing?: A Little Help from another person to put on and taking off regular lower body clothing?: A Little 6 Click Score: 18    End of Session    OT Visit Diagnosis: Other abnormalities of gait and mobility (R26.89);Hemiplegia and hemiparesis Hemiplegia - Right/Left: Right Hemiplegia - dominant/non-dominant: Dominant Hemiplegia - caused by: Nontraumatic intracerebral hemorrhage   Activity Tolerance Patient tolerated treatment well   Patient Left in chair;with call bell/phone within reach;with chair alarm set   Nurse  Communication Mobility status        Time: 0925-1006 OT Time Calculation (min): 41 min  Charges: OT General Charges $OT Visit: 1 Visit OT Treatments $Self Care/Home Management : 8-22 mins $Therapeutic Activity: 8-22 mins $Therapeutic Exercise: 8-22 mins  Dessie Coma, M.S. OTR/L  01/15/21, 11:25 AM  ascom [phone]

## 2021-01-15 NOTE — TOC Initial Note (Signed)
Transition of Care [facility]) - Initial/Assessment Note    Patient Details  Name: Larnie Heart MRN: [id number] Date of Birth: Sep 25, 1941  [facility] CM/SW Contact:    Truitt Merle, LCSW Phone Number: 01/15/2021, 12:48 PM  Clinical Narrative:                 CSW spoke to patient's daughter-Kimberly Shawn Stall [phone]). Patient lives with spouse. Adult daughters lives in [place] and here currently to assist parents in the home. CSW spoke with Freda Munro at [employer] to arrange walker delivery to the home. CSW spoke with [place] at [facility] for P/T and O/T. RN [place] updated. Patient's daughters are currently here and available to transport. No further TOC needs.  Expected Discharge Plan: [place] Barriers to Discharge: No Barriers Identified   Patient Goals and CMS Choice   CMS Medicare.gov Compare Post Acute Care list provided to:: Patient Represenative (must comment) Choice offered to / list presented to : Adult Children  Expected Discharge Plan and Services Expected Discharge Plan: [place] In-house Referral: Clinical Social Work Discharge Planning Services: CM Consult Post Acute Care Choice: [place] arrangements for the past 2 months: [place] (2 steps) Expected Discharge Date: 01/15/21               DME Arranged: Gilford Rile rolling DME Agency: [facility] Date DME Agency Contacted: 01/15/21 Time DME Agency Contacted: [id number] Representative spoke with at DME Agency: [place]: PT,OT [place] Agency: [facility] Date [facility] Agency Contacted: 01/15/21 Time [place]: [id number] Representative spoke with at [place]: Jana Half  Prior Living Arrangements/Services Living arrangements for the past 2 months: [place] (2 steps) Lives with:: Spouse Patient language and need for interpreter reviewed:: Yes Do you feel safe going back to the place where you live?: Yes      Need  for Family Participation in Patient Care: Yes (Comment) Care giver support system in place?: Yes (comment)   Criminal Activity/Legal Involvement Pertinent to Current Situation/Hospitalization: No - Comment as needed  Activities of Daily Living Home Assistive Devices/Equipment: None ADL Screening (condition at time of admission) Patient's cognitive ability adequate to safely complete daily activities?: Yes Is the patient deaf or have difficulty hearing?: No Does the patient have difficulty seeing, even when wearing glasses/contacts?: No Does the patient have difficulty concentrating, remembering, or making decisions?: No Patient able to express need for assistance with ADLs?: Yes Does the patient have difficulty dressing or bathing?: No Independently performs ADLs?: Yes (appropriate for developmental age) Does the patient have difficulty walking or climbing stairs?: No Weakness of Legs: None Weakness of Arms/Hands: None  Permission Sought/Granted Permission sought to share information with : Case Manager                Emotional Assessment Appearance:: Other (Comment Required (Unable to assess) Attitude/Demeanor/Rapport: Unable to Assess Affect (typically observed): Unable to Assess   Alcohol / Substance Use: Never Used Psych Involvement: No (comment)  Admission diagnosis:  Acute CVA (cerebrovascular accident) ([place]) [I63.9] Cerebrovascular accident (CVA), unspecified mechanism ([place]) [I63.9] Patient Active Problem List   Diagnosis Date Noted  . Acute CVA (cerebrovascular accident) ([place]) 01/13/2021   PCP:  [facility], [place]:   [place], [place] - [address] [place] [address] Phone: [phone] Fax: [phone]     Social Determinants of Health (SDOH) Interventions  Readmission Risk Interventions No flowsheet data found.

## 2021-01-17 ENCOUNTER — Telehealth (HOSPITAL_COMMUNITY): Payer: Self-pay | Admitting: Radiology

## 2021-01-17 ENCOUNTER — Telehealth: Payer: Self-pay

## 2021-01-17 NOTE — Telephone Encounter (Signed)
Patient will need transportation arranged for his 02/09/21 appt with Dr. Quentin Ore

## 2021-01-17 NOTE — Telephone Encounter (Signed)
-----   Message from Clarisse Gouge sent at 01/17/2021  1:15 PM EST ----- Regarding: FW: EP Visit / Loop Placement- New Pt Patient scheduled earliest available in [place] ( declined [place]) 3/16 at 1140 . Patient states he does not drive and will need help arranging transportation.   Added to waitlist for sooner appt.    ----- Message ----- From: Arvil Chaco, PA-C Sent: 01/15/2021   9:58 AM EST To: Wellington Hampshire, MD, [facility] [place] Scheduling Subject: EP Visit / Loop Placement- New Pt              Hello,  This patient was admitted and seen at [facility] for CVA and requires a loop within 1 week of discharge per neuro. He is still currently admitted but expected to be discharged soon.  Can you please call the pt and schedule with our EP team to discuss loop placement at the earliest available appointment (preferably within the next week)?  Thank you!  Signed, Arvil Chaco, PA-C 01/15/2021, 9:54 AM Pager [phone]

## 2021-01-17 NOTE — Telephone Encounter (Signed)
Called pt to schedule carotid stenosis treatment with Dr. Debbrah Alar. Pt states he has no transportation to and from the hospital for this treatment. Will speak to the docs and get back with him. JM

## 2021-01-18 ENCOUNTER — Telehealth (HOSPITAL_COMMUNITY): Payer: Self-pay | Admitting: Radiology

## 2021-01-18 NOTE — Telephone Encounter (Signed)
[place] transportation notified and arranged for pt's appt on 02/09/21 @ 1140.  Spoke to pt and notified that transportation will be calling him the day before his appt on 3/15 to confirm pick up times.  Home address was verified by pt.  Pt appreciative and verbalized understanding.

## 2021-01-18 NOTE — Telephone Encounter (Signed)
Called pt's daughter, left VM for her to call me to discuss scheduling Bradley Rivera for carotid artery stenosis with Dr. Debbrah Alar. Bradley Rivera states he has no transportation for this surgical procedure. JM

## 2021-01-19 ENCOUNTER — Telehealth: Payer: Self-pay | Admitting: Internal Medicine

## 2021-01-19 NOTE — Telephone Encounter (Signed)
Spoke with patient to schedule asap EP eval for loop insertion per triage.    Patient agreed to 2-24 visit with klein but does not have transportation.    Scheduled transport with [place] .  Spoke with [employer] .  Patient will be picked up at 930 at home address .    [facility] [employer] call back info [phone].

## 2021-01-20 ENCOUNTER — Encounter: Payer: Self-pay | Admitting: Internal Medicine

## 2021-01-20 ENCOUNTER — Other Ambulatory Visit (HOSPITAL_COMMUNITY): Payer: Self-pay | Admitting: Neuroradiology

## 2021-01-20 ENCOUNTER — Institutional Professional Consult (permissible substitution): Payer: Medicare PPO | Admitting: Internal Medicine

## 2021-01-20 DIAGNOSIS — I639 Cerebral infarction, unspecified: Secondary | ICD-10-CM

## 2021-01-20 NOTE — Progress Notes (Incomplete)
ELECTROPHYSIOLOGY CONSULT NOTE  Patient ID: Bradley Rivera MRN: [id number], DOB/AGE: [birth year]   Admit date: (Not on file) Date of Consult: 01/20/2021  Primary Physician: [facility] Primary Cardiologist: new  Reason for Consultation: Cryptogenic stroke; recommendations regarding Implantable Loop Recorder  History of Present Illness  EP has been asked to evaluate Bradley Rivera for placement of an implantable loop recorder to monitor for atrial fibrillation by Dr Marland Kitchen  The patient was admitted 01/12/21 with R UE weakness and facial droop..  They first developed symptoms; treated with tPA  Imaging demonstrated CVA .  he has undergone workup for stroke including echocardiogram and carotid dopplers.  The patient has been monitored on telemetry which has demonstrated sinus rhythm with no arrhythmias.  I   Echocardiogram during admission demonstrated normal LV function with normal LA size.  Lab work is reviewed.  The patient denies chest pain***, shortness of breath***, nocturnal dyspnea***, orthopnea*** or peripheral edema***.  There have been no palpitations***, lightheadedness*** or syncope***.    Prior to admission, the patient denies chest pain, shortness of breath, dizziness, palpitations, or syncope.  They are recovering from their stroke with plans to *** at discharge.  Tel: sinus with MBz 1 2AVB Date Cr K Hgb  2/22 1.0 4.2 11.9            Past Medical History:  Diagnosis Date  . 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 Stroke aborted by administration of thrombolytic agent [facility])      Surgical History:  Past Surgical History:  Procedure Laterality Date  . GASTRECTOMY    . HERNIA REPAIR         Allergies:  Allergies  Allergen Reactions  . Lisinopril Swelling  . Bromfenac   . Nepafenac Other (See Comments)  . Other Other (See Comments)  . Asa [Aspirin] Other (See Comments)    Per patient burning in stomach but he is willing  to try an  enteric coated aspirin.    . Terazosin Palpitations and Other (See Comments)    Social History   Socioeconomic History  . Marital status: Married    Spouse name: Not on file  . Number of children: Not on file  . Years of education: Not on file  . Highest education level: Not on file  Occupational History  . Not on file  Tobacco Use  . Smoking status: Current Some Day Smoker  . Smokeless tobacco: Never Used  Substance and Sexual Activity  . Alcohol use: Not on file  . Drug use: Not on file  . Sexual activity: Not on file  Other Topics Concern  . Not on file  Social History Narrative  . Not on file   Social Determinants of Health   Financial Resource Strain: Not on file  Food Insecurity: Not on file  Transportation Needs: Not on file  Physical Activity: Not on file  Stress: Not on file  Social Connections: Not on file  Intimate Partner Violence: Not on file     No family history on file.    Review of Systems: All other systems reviewed and are otherwise negative except as noted above.  Physical Exam: There were no vitals filed for this visit.  GEN- The patient is well appearing, alert and oriented x 3 today.   Head- normocephalic, atraumatic Eyes-  Sclera clear, conjunctiva pink Ears- hearing intact Oropharynx- clear Neck- supple Lungs- Clear to ausculation bilaterally, normal work of breathing Heart- Regular rate and  rhythm, no murmurs, rubs or gallops  GI- soft, NT, ND, + BS Extremities- no clubbing, cyanosis, or edema MS- no significant deformity or atrophy Skin- no rash or lesion Psych- euthymic mood, full affect   Labs:   Lab Results  Component Value Date   WBC 7.7 01/13/2021   HGB 11.9 (L) 01/13/2021   HCT 35.5 (L) 01/13/2021   MCV 98.6 01/13/2021   PLT 164 01/13/2021   No results for input(s): NA, K, CL, CO2, BUN, CREATININE, CALCIUM, PROT, BILITOT, ALKPHOS, ALT, AST, GLUCOSE in the last 168 hours.  Invalid input(s): LABALBU    Radiology/Studies: CT Angio Head W or Wo Contrast  Result Date: 01/13/2021 CLINICAL DATA:  Right-sided weakness EXAM: CT ANGIOGRAPHY HEAD AND NECK CT PERFUSION BRAIN TECHNIQUE: Multidetector CT imaging of the head and neck was performed using the standard protocol during bolus administration of intravenous contrast. Multiplanar CT image reconstructions and MIPs were obtained to evaluate the vascular anatomy. Carotid stenosis measurements (when applicable) are obtained utilizing NASCET criteria, using the distal internal carotid diameter as the denominator. Multiphase CT imaging of the brain was performed following IV bolus contrast injection. Subsequent parametric perfusion maps were calculated using RAPID software. CONTRAST:  119mL OMNIPAQUE IOHEXOL 350 MG/ML SOLN COMPARISON:  None. FINDINGS: CTA NECK FINDINGS SKELETON: There is no bony spinal canal stenosis. No lytic or blastic lesion. OTHER NECK: Normal pharynx, larynx and major salivary glands. No cervical lymphadenopathy. Unremarkable thyroid gland. UPPER CHEST: No pneumothorax or pleural effusion. No nodules or masses. AORTIC ARCH: There is moderate atherosclerosis of the aortic arch. There is no aneurysm, dissection or hemodynamically significant stenosis of the visualized portion of the aorta. Conventional 3 vessel aortic branching pattern. The visualized proximal subclavian arteries are widely patent. RIGHT CAROTID SYSTEM: Normal without aneurysm, dissection or stenosis. 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 No hemodynamically significant stenosis. VERTEBRAL ARTERIES: Left dominant configuration. 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 The left vertebral artery is normal along its entire course. CTA HEAD FINDINGS POSTERIOR CIRCULATION: --Vertebral arteries: Normal left.  The right terminates in PICA. --Inferior cerebellar arteries: Normal. --Basilar artery: Diminutive  --Superior cerebellar arteries: Normal. --Posterior cerebral arteries (PCA): Normal. ANTERIOR CIRCULATION: --Intracranial internal carotid arteries: Normal. --Anterior cerebral arteries (ACA): Normal. Hypoplastic right A1 segment, normal variant. --Middle cerebral arteries (MCA): Normal. VENOUS SINUSES: As permitted by contrast timing, patent. ANATOMIC VARIANTS: Fetal origins of both posterior cerebral arteries. Review of the MIP images confirms the above findings. CT Brain Perfusion Findings: ASPECTS: 10 CBF (<30%) Volume: 25mL Perfusion (Tmax>6.0s) volume: 9mL Mismatch Volume: 105mL Infarction Location:No infarct. 4 mL region of ischemia identified in the left middle cerebral artery territory. IMPRESSION: 1. No emergent large vessel occlusion or high-grade stenosis of the intracranial arteries. 2. Perfusion study shows 4 mL region of acute ischemia in the left middle cerebral artery territory without core infarct or intracranial hemorrhage. 3. 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 Aortic Atherosclerosis (ICD10-I70.0). Electronically Signed   By: Ulyses Jarred M.D.   On: 01/13/2021 00:56   CT HEAD WO CONTRAST  Result Date: 01/14/2021 CLINICAL DATA:  Stroke follow-up 24 hours status post tPA EXAM: CT HEAD WITHOUT CONTRAST TECHNIQUE: Contiguous axial images were obtained from the base of the skull through the vertex without intravenous contrast. COMPARISON:  None. FINDINGS: Brain: There is no mass, hemorrhage or extra-axial collection. The size and configuration of the ventricles and extra-axial CSF spaces are normal. Old left  basal ganglia small vessel infarct. Vascular: No abnormal hyperdensity of the major intracranial arteries or dural venous sinuses. No intracranial atherosclerosis. Skull: The visualized skull base, calvarium and extracranial soft tissues are normal. Sinuses/Orbits: No fluid levels or advanced mucosal thickening of the visualized paranasal sinuses. No  mastoid or middle ear effusion. The orbits are normal. IMPRESSION: 1. No hemorrhage. 2. Old left basal ganglia small vessel infarct. Electronically Signed   By: Ulyses Jarred M.D.   On: 01/14/2021 02:01   CT Angio Neck W and/or Wo Contrast  Result Date: 01/13/2021 CLINICAL DATA:  Right-sided weakness EXAM: CT ANGIOGRAPHY HEAD AND NECK CT PERFUSION BRAIN TECHNIQUE: Multidetector CT imaging of the head and neck was performed using the standard protocol during bolus administration of intravenous contrast. Multiplanar CT image reconstructions and MIPs were obtained to evaluate the vascular anatomy. Carotid stenosis measurements (when applicable) are obtained utilizing NASCET criteria, using the distal internal carotid diameter as the denominator. Multiphase CT imaging of the brain was performed following IV bolus contrast injection. Subsequent parametric perfusion maps were calculated using RAPID software. CONTRAST:  136mL OMNIPAQUE IOHEXOL 350 MG/ML SOLN COMPARISON:  None. FINDINGS: CTA NECK FINDINGS SKELETON: There is no bony spinal canal stenosis. No lytic or blastic lesion. OTHER NECK: Normal pharynx, larynx and major salivary glands. No cervical lymphadenopathy. Unremarkable thyroid gland. UPPER CHEST: No pneumothorax or pleural effusion. No nodules or masses. AORTIC ARCH: There is moderate atherosclerosis of the aortic arch. There is no aneurysm, dissection or hemodynamically significant stenosis of the visualized portion of the aorta. Conventional 3 vessel aortic branching pattern. The visualized proximal subclavian arteries are widely patent. RIGHT CAROTID SYSTEM: Normal without aneurysm, dissection or stenosis. 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 No hemodynamically significant stenosis. VERTEBRAL ARTERIES: Left dominant configuration. 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 The left vertebral artery is normal along its  entire course. CTA HEAD FINDINGS POSTERIOR CIRCULATION: --Vertebral arteries: Normal left.  The right terminates in PICA. --Inferior cerebellar arteries: Normal. --Basilar artery: Diminutive --Superior cerebellar arteries: Normal. --Posterior cerebral arteries (PCA): Normal. ANTERIOR CIRCULATION: --Intracranial internal carotid arteries: Normal. --Anterior cerebral arteries (ACA): Normal. Hypoplastic right A1 segment, normal variant. --Middle cerebral arteries (MCA): Normal. VENOUS SINUSES: As permitted by contrast timing, patent. ANATOMIC VARIANTS: Fetal origins of both posterior cerebral arteries. Review of the MIP images confirms the above findings. CT Brain Perfusion Findings: ASPECTS: 10 CBF (<30%) Volume: 62mL Perfusion (Tmax>6.0s) volume: 77mL Mismatch Volume: 64mL Infarction Location:No infarct. 4 mL region of ischemia identified in the left middle cerebral artery territory. IMPRESSION: 1. No emergent large vessel occlusion or high-grade stenosis of the intracranial arteries. 2. Perfusion study shows 4 mL region of acute ischemia in the left middle cerebral artery territory without core infarct or intracranial hemorrhage. 3. 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 Aortic Atherosclerosis (ICD10-I70.0). Electronically Signed   By: Ulyses Jarred M.D.   On: 01/13/2021 00:56   MR BRAIN WO CONTRAST  Result Date: 01/14/2021 CLINICAL DATA:  Sudden onset of right-sided weakness, slurred speech and dysarthria. EXAM: MRI HEAD WITHOUT CONTRAST TECHNIQUE: Multiplanar, multiecho pulse sequences of the brain and surrounding structures were obtained without intravenous contrast. COMPARISON:  Head CT earlier today. CT studies yesterday. MRI 06/27/2017. FINDINGS: Brain: Diffusion imaging shows a 3 cm region of acute infarction at the site of the perfusion abnormality affecting the cortical and subcortical brain at the left posterior frontal lobe. No swelling or hemorrhage. No other  acute infarction. Elsewhere, the brainstem appears normal. There are a few old small vessel cerebellar infarctions. Cerebral hemispheres elsewhere show mild chronic small-vessel change of the white matter, less than often seen at this age. No mass, hydrocephalus or extra-axial collection. Vascular: Major vessels at the base of the brain show flow. Skull and upper cervical spine: Negative Sinuses/Orbits: Clear/normal Other: None IMPRESSION: 1. 3 cm region of acute infarction affecting the cortical and subcortical brain at the site of the perfusion abnormality in the left posterior frontal lobe. No swelling or hemorrhage. 2. Mild chronic small-vessel ischemic changes elsewhere throughout the brain, less than often seen at this age. Electronically Signed   By: Nelson Chimes M.D.   On: 01/14/2021 18:02   CT CEREBRAL PERFUSION W CONTRAST  Result Date: 01/13/2021 CLINICAL DATA:  Right-sided weakness EXAM: CT ANGIOGRAPHY HEAD AND NECK CT PERFUSION BRAIN TECHNIQUE: Multidetector CT imaging of the head and neck was performed using the standard protocol during bolus administration of intravenous contrast. Multiplanar CT image reconstructions and MIPs were obtained to evaluate the vascular anatomy. Carotid stenosis measurements (when applicable) are obtained utilizing NASCET criteria, using the distal internal carotid diameter as the denominator. Multiphase CT imaging of the brain was performed following IV bolus contrast injection. Subsequent parametric perfusion maps were calculated using RAPID software. CONTRAST:  12mL OMNIPAQUE IOHEXOL 350 MG/ML SOLN COMPARISON:  None. FINDINGS: CTA NECK FINDINGS SKELETON: There is no bony spinal canal stenosis. No lytic or blastic lesion. OTHER NECK: Normal pharynx, larynx and major salivary glands. No cervical lymphadenopathy. Unremarkable thyroid gland. UPPER CHEST: No pneumothorax or pleural effusion. No nodules or masses. AORTIC ARCH: There is moderate atherosclerosis of the  aortic arch. There is no aneurysm, dissection or hemodynamically significant stenosis of the visualized portion of the aorta. Conventional 3 vessel aortic branching pattern. The visualized proximal subclavian arteries are widely patent. RIGHT CAROTID SYSTEM: Normal without aneurysm, dissection or stenosis. 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 No hemodynamically significant stenosis. VERTEBRAL ARTERIES: Left dominant configuration. 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 The left vertebral artery is normal along its entire course. CTA HEAD FINDINGS POSTERIOR CIRCULATION: --Vertebral arteries: Normal left.  The right terminates in PICA. --Inferior cerebellar arteries: Normal. --Basilar artery: Diminutive --Superior cerebellar arteries: Normal. --Posterior cerebral arteries (PCA): Normal. ANTERIOR CIRCULATION: --Intracranial internal carotid arteries: Normal. --Anterior cerebral arteries (ACA): Normal. Hypoplastic right A1 segment, normal variant. --Middle cerebral arteries (MCA): Normal. VENOUS SINUSES: As permitted by contrast timing, patent. ANATOMIC VARIANTS: Fetal origins of both posterior cerebral arteries. Review of the MIP images confirms the above findings. CT Brain Perfusion Findings: ASPECTS: 10 CBF (<30%) Volume: 3mL Perfusion (Tmax>6.0s) volume: 10mL Mismatch Volume: 3mL Infarction Location:No infarct. 4 mL region of ischemia identified in the left middle cerebral artery territory. IMPRESSION: 1. No emergent large vessel occlusion or high-grade stenosis of the intracranial arteries. 2. Perfusion study shows 4 mL region of acute ischemia in the left middle cerebral artery territory without core infarct or intracranial hemorrhage. 3. 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 Aortic Atherosclerosis (ICD10-I70.0). Electronically Signed   By: Ulyses Jarred M.D.   On: 01/13/2021 00:56   CT  HEAD CODE STROKE WO CONTRAST  Result Date: 01/13/2021 CLINICAL DATA:  Code stroke.  Dizziness and right-sided weakness EXAM: CT HEAD WITHOUT CONTRAST TECHNIQUE: Contiguous axial images were obtained from the base of the skull through the vertex without intravenous contrast. COMPARISON:  None. FINDINGS: Brain: There  is no mass, hemorrhage or extra-axial collection. There is generalized atrophy without lobar predilection. Areas of hypoattenuation of the deep gray nuclei and confluent periventricular white matter hypodensity, consistent with chronic small vessel disease. Vascular: Atherosclerotic calcification of the internal carotid arteries at the skull base. No abnormal hyperdensity of the major intracranial arteries or dural venous sinuses. Skull: The visualized skull base, calvarium and extracranial soft tissues are normal. Sinuses/Orbits: No fluid levels or advanced mucosal thickening of the visualized paranasal sinuses. No mastoid or middle ear effusion. The orbits are normal. ASPECTS [facility] Stroke Program Early CT Score) - Ganglionic level infarction (caudate, lentiform nuclei, internal capsule, insula, M1-M3 cortex): 7 - Supraganglionic infarction (M4-M6 cortex): 3 Total score (0-10 with 10 being normal): 10 IMPRESSION: 1. No acute intracranial abnormality. 2. ASPECTS is 10. 3. 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 These results were called by telephone at the time of interpretation on 01/13/2021 at 12:14 am to provider [facility] , who verbally acknowledged these results. Electronically Signed   By: Ulyses Jarred M.D.   On: 01/13/2021 00:16    12-lead ECG 01/12/21 sinus  (personally reviewed) All prior EKG's in EPIC reviewed with no documented atrial fibrillation  Telemetry no afib (personally reviewed)  Assessment and Plan:  1. Cryptogenic stroke 2. MBz 1 2AVBlock     The patient presented with cryptogenic stroke Rx.  The patient has a TEE planned for this AM.  I  spoke at length with the patient about monitoring for afib with an implantable loop recorder.  Risks, benefits, and alteratives to implantable loop recorder were discussed with the patient today.   At this time, the patient is very clear in their decision to proceed with implantable loop recorder.   Wound care was reviewed with the patient (keep incision clean and dry for 3 days).  Wound check scheduled and entered in AVS.  Please call with questions.   Virl Axe, MD 01/20/2021 8:39 AM

## 2021-01-21 ENCOUNTER — Telehealth: Payer: Self-pay | Admitting: Internal Medicine

## 2021-01-21 MED ORDER — CLOPIDOGREL BISULFATE 75 MG PO TABS: 75.0000 mg | ORAL_TABLET | Freq: Every day | ORAL | 0 refills | Status: DC

## 2021-01-21 MED ORDER — ASPIRIN 81 MG PO TBEC: 81.0000 mg | DELAYED_RELEASE_TABLET | Freq: Every day | ORAL | 0 refills | Status: DC

## 2021-01-21 MED ORDER — ATORVASTATIN CALCIUM 40 MG PO TABS: 40.0000 mg | ORAL_TABLET | Freq: Every day | ORAL | 3 refills | Status: DC

## 2021-01-21 NOTE — Telephone Encounter (Signed)
Attempted to change visit with klein to virtual per HM.    Patient sleeping .  Will reach out again Monday .

## 2021-01-21 NOTE — Telephone Encounter (Signed)
Prescriptions of ASA, plavix and statin sent to [facility] (per request by Sherrill).     Estill Cotta M.D.  [facility] 01/21/2021, 7:15 AM

## 2021-01-24 ENCOUNTER — Telehealth: Payer: Self-pay | Admitting: *Deleted

## 2021-01-24 ENCOUNTER — Telehealth: Payer: Self-pay | Admitting: Internal Medicine

## 2021-01-24 NOTE — Telephone Encounter (Signed)
-----   Message from Emily Filbert, RN sent at 01/20/2021 11:28 AM EST ----- Regarding: RE: EP Visit / Loop Placement- New Pt Bradley Rivera Has,   This little fella no showed for Dr. Caryl Comes today even after you requested transportation for him. Not sure if no one came to get him or what happened, but per the Pre-Cert department, his insurance is requiring an in office consult prior to approving his linq implant, so we will not be able to implant him same day anyway. Do you mind trying to reschedule him. With SK/ CL. I can look with you for a work in spot for SK if you need me to.   Thanks! ----- Message ----- From: Clarisse Gouge Sent: 01/17/2021   1:17 PM EST To: Emily Filbert, RN Subject: FW: EP Visit / Loop Placement- New Pt          Loop eval 3/16    ----- Message ----- From: Arvil Chaco, PA-C Sent: 01/15/2021   9:58 AM EST To: Wellington Hampshire, MD, [facility] Scheduling Subject: EP Visit / Loop Placement- New Pt              Hello,  This patient was admitted and seen at [facility] for CVA and requires a loop within 1 week of discharge per neuro. He is still currently admitted but expected to be discharged soon.  Can you please call the pt and schedule with our EP team to discuss loop placement at the earliest available appointment (preferably within the next week)?  Thank you!  Signed, Arvil Chaco, PA-C 01/15/2021, 9:54 AM Pager [phone]

## 2021-01-24 NOTE — Telephone Encounter (Signed)
The patient is scheduled to see Dr. Caryl Comes on 01/27/21- per Dr. Caryl Comes- ok for this to be a virtual visit. Scheduling is trying to reach out to the patient to switch this.

## 2021-01-24 NOTE — Telephone Encounter (Signed)
The patient is scheduled for a virtual visit on 01/27/21 with Dr. Caryl Comes as per hospital discharge for stroke. Per our pre-cert department last week, the patient's insurance is requiring a consult visit prior to approving his loop recorder.  The patient no showed for his in office visit on 01/20/21 with Dr. Caryl Comes- per MD ok to reschedule his appointment and make this a virtual visit.  The patient was contacted by our office this morning to convert his 01/27/21 appointment to a virtual visit as he was having transportation issues last week.

## 2021-01-24 NOTE — Telephone Encounter (Signed)
Please call to discuss pier to pier review for loop recorder implant

## 2021-01-24 NOTE — Telephone Encounter (Signed)
  Patient Consent for Virtual Visit         Bradley Rivera has provided verbal consent on 01/24/2021 for a virtual visit (video or telephone).   CONSENT FOR VIRTUAL VISIT FOR:  Bradley Rivera  By participating in this virtual visit I agree to the following:  I hereby voluntarily request, consent and authorize [facility] and its employed or contracted physicians, physician assistants, nurse practitioners or other licensed health care professionals (the Practitioner), to provide me with telemedicine health care services (the "Services") as deemed necessary by the treating Practitioner. I acknowledge and consent to receive the Services by the Practitioner via telemedicine. I understand that the telemedicine visit will involve communicating with the Practitioner through live audiovisual communication technology and the disclosure of certain medical information by electronic transmission. I acknowledge that I have been given the opportunity to request an in-person assessment or other available alternative prior to the telemedicine visit and am voluntarily participating in the telemedicine visit.  I understand that I have the right to withhold or withdraw my consent to the use of telemedicine in the course of my care at any time, without affecting my right to future care or treatment, and that the Practitioner or I may terminate the telemedicine visit at any time. I understand that I have the right to inspect all information obtained and/or recorded in the course of the telemedicine visit and may receive copies of available information for a reasonable fee.  I understand that some of the potential risks of receiving the Services via telemedicine include:  Marland Kitchen Delay or interruption in medical evaluation due to technological equipment failure or disruption; . Information transmitted may not be sufficient (e.g. poor resolution of images) to allow for appropriate medical decision making by the Practitioner;  and/or  . In rare instances, security protocols could fail, causing a breach of personal health information.  Furthermore, I acknowledge that it is my responsibility to provide information about my medical history, conditions and care that is complete and accurate to the best of my ability. I acknowledge that Practitioner's advice, recommendations, and/or decision may be based on factors not within their control, such as incomplete or inaccurate data provided by me or distortions of diagnostic images or specimens that may result from electronic transmissions. I understand that the practice of medicine is not an exact science and that Practitioner makes no warranties or guarantees regarding treatment outcomes. I acknowledge that a copy of this consent can be made available to me via my patient portal ([facility]), or I can request a printed copy by calling the office of [facility].    I understand that my insurance will be billed for this visit.   I have read or had this consent read to me. . I understand the contents of this consent, which adequately explains the benefits and risks of the Services being provided via telemedicine.  . I have been provided ample opportunity to ask questions regarding this consent and the Services and have had my questions answered to my satisfaction. . I give my informed consent for the services to be provided through the use of telemedicine in my medical care

## 2021-01-25 ENCOUNTER — Telehealth: Payer: Self-pay | Admitting: Internal Medicine

## 2021-01-25 ENCOUNTER — Telehealth (HOSPITAL_COMMUNITY): Payer: Self-pay | Admitting: Radiology

## 2021-01-25 ENCOUNTER — Other Ambulatory Visit: Payer: Self-pay | Admitting: Radiology

## 2021-01-25 NOTE — Telephone Encounter (Signed)
This message contains 2 separate phone #'s to contact. Called [phone] 1st, but this is the incorrect # for this issue as this goes to [facility] and was for clinical review for the patient's loop recorder.  I then called the initial # from the message: [phone] This goes to [facility], but was asking for a 7 digit extension that I was not provided in this message and was therefore unable to proceed with this call.  However, clopidogrel is not being prescribed by cardiology. This was prescribed by the hospitalist at discharge for a stroke and should therefore be managed by his PCP/ Neurologist.   Will leave this encounter open in case the [facility] calls back. We will need to receive a  7 digit extension to call back if not transferred directly to nursing staff.

## 2021-01-25 NOTE — Telephone Encounter (Signed)
[employer] nurse calling  States that for patient to get clopidogrel medication from the [place] we would need to send clinical documentation from when we prescribed  This could take a while to process and would like to know if we are ok with him waiting Fax is [phone] ATTN: Dr Jettie Booze  Please review and advise

## 2021-01-25 NOTE — Anesthesia Preprocedure Evaluation (Addendum)
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 Left ventricular ejection fraction, by estimation, is 60 to 65%. The left ventricle has normal function. The left ventricle has no regional wall motion abnormalities. Left ventricular diastolic parameters are consistent with Grade I diastolic dysfunction (impaired relaxation). The average left ventricular global longitudinal strain is -18.1 %. The global longitudinal strain is normal. 2. Right ventricular systolic function is normal. The right ventricular size is normal. Tricuspid regurgitation signal is inadequate for assessing PA pressure. 3. The mitral valve is normal in structure. Mild mitral valve regurgitation.   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, some residual weakness R hand), Residual Symptoms negative psych ROS   GI/Hepatic Neg liver ROS, Stomach ca s/p gastrectomy, denies ever having reflux   Endo/Other  negative endocrine ROS  Renal/GU negative Renal ROS  negative genitourinary   Musculoskeletal negative musculoskeletal ROS (+)   Abdominal   Peds  Hematology negative hematology ROS (+) 11.9/35.5   Anesthesia Other Findings Hx CRC, stomach ca, prostate ca  Reproductive/Obstetrics negative OB ROS                            Anesthesia  Physical Anesthesia Plan  ASA: III  Anesthesia Plan: General   Post-op Pain Management:    Induction: Intravenous and Rapid sequence  PONV Risk Score and Plan: 1 and Ondansetron, Dexamethasone and Treatment may vary due to age or medical condition  Airway Management Planned: Oral ETT  Additional Equipment:   Intra-op Plan:   Post-operative Plan: Extubation in OR and Possible Post-op intubation/ventilation  Informed Consent: I have reviewed the patients History and Physical, chart, labs and discussed the procedure including the risks, benefits and alternatives for the proposed anesthesia with the patient or authorized representative who has indicated his/her understanding and acceptance.     Dental advisory given  Plan Discussed with: CRNA  Anesthesia Plan Comments:        Anesthesia Quick Evaluation

## 2021-01-25 NOTE — Progress Notes (Addendum)
I was going over Bradley Rivera's medication list with him, Bradley Rivera reports that he has not been taking ASA or Plavix- "It did not come to the house."  I called IR and asked for a phone number for Bradley Rivera, I was told that there is not a number for her, I was given Bradley Rivera pager. Bradley Rivera called and gave me Bradley Rivera pager number. I paged Bradley Rivera pager and did not receive a return call.  I checked Amion and found a  IR nurse that is on call , Bradley Rivera I called Bradley Rivera , she is going to attempt to get in touch with her supervisor and will call me back. I paged Bradley Rivera again, he returned my call and said that Bradley Rivera, the Coordinator for IR is going to call Bradley Rivera, Bradley Rivera she he would cal Bradley Rivera and have her call me. Bradley Rivera called me back , she said she spoke to Bradley Rivera, Carsten feels that patient may be a little confused regarding medications.Bradley Rivera also she instructed patient to take all medications except vitamins.  Bradley Rivera saidd that a p2y12 will be drawn and Bradley Rivera may hold padtient up on medications.   I called Bradley Rivera to give him Short Stay instructions, I asked patient again, if he is taking Aspirin and Plavix, Bradley Rivera said that he has not received the medications in the mail.   Bradley Rivera denies any chest pain or shortness of breath, patient denies any s/s of Covid and has not been in contact with anyone who has s/s of covid to his knowledge. Patient will be tested for Covid on arrival.

## 2021-01-25 NOTE — Telephone Encounter (Signed)
I called and spoke with a representative for [facility].  I advised that the patient was recently discharged from [facility] for a stroke. He has been referred to [place] for consultation for a loop recorder implant, but has not had his formal consultation by our office yet.  I advised we tried to submit an authorization last week to see if we could get this approved so we could implant in the office the same day if possible, but I was already told he would need the consult first and then we could re-submit, therefore, the patient's appointment has been switched to a virtual visit on 01/27/21.  Per representative, she placed me on brief hold to notify the MD doing the peer to peer review and they will go ahead and withdraw the request with notes that we will re-submit if needed after his consult visit.

## 2021-01-25 NOTE — Telephone Encounter (Signed)
Please call regading pier to pier, states that it has to be done by 6 pm today.

## 2021-01-25 NOTE — Telephone Encounter (Signed)
Called pt and daughter. Pt is not sure of his medications. He is going to come to his procedure tomorrow morning as planned. Per Dr. Debbrah Alar we will check his P2Y12 upon arrival and if needed we will load the patient with Aspirin and Brilinta prior to procedure. JM

## 2021-01-26 ENCOUNTER — Encounter (HOSPITAL_COMMUNITY): Payer: Self-pay

## 2021-01-26 ENCOUNTER — Ambulatory Visit (HOSPITAL_COMMUNITY)
Admission: AD | Admit: 2021-01-26 | Discharge: 2021-01-27 | Disposition: A | Discharge status: Home / Self Care | Payer: Medicare PPO | Attending: Neuroradiology | Admitting: Neuroradiology

## 2021-01-26 ENCOUNTER — Inpatient Hospital Stay (HOSPITAL_COMMUNITY)
Admission: RE | Admit: 2021-01-26 | Discharge: 2021-01-26 | Disposition: A | Discharge status: Home / Self Care | Payer: Medicare PPO | Source: Ambulatory Visit | Attending: Neuroradiology | Admitting: Neuroradiology

## 2021-01-26 ENCOUNTER — Encounter (HOSPITAL_COMMUNITY)
Admission: AD | Disposition: A | Discharge status: Home / Self Care | Payer: Self-pay | Source: Home / Self Care | Attending: Neuroradiology

## 2021-01-26 ENCOUNTER — Other Ambulatory Visit: Payer: Self-pay

## 2021-01-26 ENCOUNTER — Ambulatory Visit (HOSPITAL_COMMUNITY): Payer: Medicare PPO | Admitting: Certified Registered"

## 2021-01-26 DIAGNOSIS — Z79899 Other long term (current) drug therapy: Secondary | ICD-10-CM | POA: Insufficient documentation

## 2021-01-26 DIAGNOSIS — F17210 Nicotine dependence, cigarettes, uncomplicated: Secondary | ICD-10-CM | POA: Diagnosis not present

## 2021-01-26 DIAGNOSIS — I639 Cerebral infarction, unspecified: Principal | ICD-10-CM

## 2021-01-26 DIAGNOSIS — Z20822 Contact with and (suspected) exposure to covid-19: Secondary | ICD-10-CM | POA: Insufficient documentation

## 2021-01-26 DIAGNOSIS — Z886 Allergy status to analgesic agent status: Secondary | ICD-10-CM | POA: Insufficient documentation

## 2021-01-26 DIAGNOSIS — Z7902 Long term (current) use of antithrombotics/antiplatelets: Secondary | ICD-10-CM | POA: Diagnosis not present

## 2021-01-26 DIAGNOSIS — I779 Disorder of arteries and arterioles, unspecified: Secondary | ICD-10-CM | POA: Diagnosis present

## 2021-01-26 DIAGNOSIS — Z888 Allergy status to other drugs, medicaments and biological substances status: Secondary | ICD-10-CM | POA: Insufficient documentation

## 2021-01-26 DIAGNOSIS — Z7982 Long term (current) use of aspirin: Secondary | ICD-10-CM | POA: Diagnosis not present

## 2021-01-26 HISTORY — PX: IR STENT PLACEMENT ANTE CAROTID INC ANGIO: IMG5522

## 2021-01-26 HISTORY — PX: IR INTRAVSC STENT CERV CAROTID W/EMB-PROT MOD SED INCL ANGIO: IMG2303

## 2021-01-26 HISTORY — PX: RADIOLOGY WITH ANESTHESIA: SHX6223

## 2021-01-26 LAB — SARS CORONAVIRUS 2 BY RT PCR (HOSPITAL ORDER, PERFORMED IN ~~LOC~~ HOSPITAL LAB): SARS Coronavirus 2: NEGATIVE

## 2021-01-26 LAB — CBC WITH DIFFERENTIAL/PLATELET
Abs Immature Granulocytes: 0.01 10*3/uL (ref 0.00–0.07)
Basophils Absolute: 0 10*3/uL (ref 0.0–0.1)
Basophils Relative: 1 %
Eosinophils Absolute: 0 10*3/uL (ref 0.0–0.5)
Eosinophils Relative: 1 %
HCT: 37.2 % — ABNORMAL LOW (ref 39.0–52.0)
Hemoglobin: 12 g/dL — ABNORMAL LOW (ref 13.0–17.0)
Immature Granulocytes: 0 %
Lymphocytes Relative: 29 %
Lymphs Abs: 1.7 10*3/uL (ref 0.7–4.0)
MCH: 33 pg (ref 26.0–34.0)
MCHC: 32.3 g/dL (ref 30.0–36.0)
MCV: 102.2 fL — ABNORMAL HIGH (ref 80.0–100.0)
Monocytes Absolute: 0.6 10*3/uL (ref 0.1–1.0)
Monocytes Relative: 10 %
Neutro Abs: 3.4 10*3/uL (ref 1.7–7.7)
Neutrophils Relative %: 59 %
Platelets: 274 10*3/uL (ref 150–400)
RBC: 3.64 MIL/uL — ABNORMAL LOW (ref 4.22–5.81)
RDW: 13.5 % (ref 11.5–15.5)
WBC: 5.7 10*3/uL (ref 4.0–10.5)
nRBC: 0 % (ref 0.0–0.2)

## 2021-01-26 LAB — BASIC METABOLIC PANEL
Anion gap: 9 (ref 5–15)
BUN: 14 mg/dL (ref 8–23)
CO2: 27 mmol/L (ref 22–32)
Calcium: 9.5 mg/dL (ref 8.9–10.3)
Chloride: 103 mmol/L (ref 98–111)
Creatinine, Ser: 1.15 mg/dL (ref 0.61–1.24)
GFR, Estimated: >60 mL/min (ref >60)
Glucose, Bld: 102 mg/dL — ABNORMAL HIGH (ref 70–99)
Potassium: 4 mmol/L (ref 3.5–5.1)
Sodium: 139 mmol/L (ref 135–145)

## 2021-01-26 LAB — POCT ACTIVATED CLOTTING TIME: Activated Clotting Time: 297 seconds

## 2021-01-26 LAB — PROTIME-INR
INR: 1.1 (ref 0.8–1.2)
Prothrombin Time: 14.1 seconds (ref 11.4–15.2)

## 2021-01-26 LAB — PLATELET INHIBITION P2Y12: Platelet Function  P2Y12: 202 [PRU] (ref 182–335)

## 2021-01-26 SURGERY — IR WITH ANESTHESIA
Anesthesia: General

## 2021-01-26 MED ORDER — SODIUM CHLORIDE 0.9 % IV SOLN: INTRAVENOUS | Status: DC

## 2021-01-26 MED ORDER — VERAPAMIL HCL 2.5 MG/ML IV SOLN
INTRAVENOUS | Status: AC
  Filled 2021-01-26: qty 2

## 2021-01-26 MED ORDER — CLEVIDIPINE BUTYRATE 0.5 MG/ML IV EMUL
0.0000 mg/h | INTRAVENOUS | Status: DC
  Administered 2021-01-26: 1 mg/h via INTRAVENOUS
  Administered 2021-01-26: 4 mg/h via INTRAVENOUS
  Filled 2021-01-26 (×3): qty 50

## 2021-01-26 MED ORDER — EPTIFIBATIDE 20 MG/10ML IV SOLN
INTRAVENOUS | Status: AC
  Filled 2021-01-26: qty 10

## 2021-01-26 MED ORDER — IOHEXOL 240 MG/ML SOLN
150.0000 mL | Freq: Once | INTRAMUSCULAR | Status: AC | PRN
  Administered 2021-01-26: 75 mL via INTRAVENOUS

## 2021-01-26 MED ORDER — TICAGRELOR 90 MG PO TABS: 90.0000 mg | ORAL_TABLET | Freq: Two times a day (BID) | ORAL | Status: DC

## 2021-01-26 MED ORDER — CHLORHEXIDINE GLUCONATE 0.12 % MT SOLN
OROMUCOSAL | Status: AC
  Administered 2021-01-26: 15 mL
  Filled 2021-01-26: qty 15

## 2021-01-26 MED ORDER — PROPOFOL 10 MG/ML IV BOLUS
INTRAVENOUS | Status: DC | PRN
  Administered 2021-01-26: 30 mg via INTRAVENOUS
  Administered 2021-01-26: 100 mg via INTRAVENOUS

## 2021-01-26 MED ORDER — FENTANYL CITRATE (PF) 100 MCG/2ML IJ SOLN
INTRAMUSCULAR | Status: DC | PRN
  Administered 2021-01-26: 50 ug via INTRAVENOUS

## 2021-01-26 MED ORDER — HEPARIN SODIUM (PORCINE) 1000 UNIT/ML IJ SOLN
INTRAMUSCULAR | Status: DC | PRN
  Administered 2021-01-26: 5000 [IU] via INTRAVENOUS

## 2021-01-26 MED ORDER — SUGAMMADEX SODIUM 200 MG/2ML IV SOLN
INTRAVENOUS | Status: DC | PRN
  Administered 2021-01-26: 200 mg via INTRAVENOUS

## 2021-01-26 MED ORDER — TICAGRELOR 90 MG PO TABS
ORAL_TABLET | ORAL | Status: AC
  Filled 2021-01-26: qty 1

## 2021-01-26 MED ORDER — ASPIRIN 81 MG PO CHEW
325.0000 mg | CHEWABLE_TABLET | Freq: Once | ORAL | Status: AC
  Administered 2021-01-26: 325 mg via ORAL

## 2021-01-26 MED ORDER — PHENYLEPHRINE HCL (PRESSORS) 10 MG/ML IV SOLN
INTRAVENOUS | Status: DC | PRN
  Administered 2021-01-26 (×4): 80 ug via INTRAVENOUS

## 2021-01-26 MED ORDER — TICAGRELOR 90 MG PO TABS: 180.0000 mg | ORAL_TABLET | Freq: Once | ORAL | Status: DC

## 2021-01-26 MED ORDER — TICAGRELOR 90 MG PO TABS
90.0000 mg | ORAL_TABLET | Freq: Two times a day (BID) | ORAL | Status: DC
  Administered 2021-01-26 – 2021-01-27 (×2): 90 mg via ORAL
  Filled 2021-01-26 (×2): qty 1

## 2021-01-26 MED ORDER — ACETAMINOPHEN 325 MG PO TABS: 650.0000 mg | ORAL_TABLET | ORAL | Status: DC | PRN

## 2021-01-26 MED ORDER — NIMODIPINE 30 MG PO CAPS: 0.0000 mg | ORAL_CAPSULE | ORAL | Status: DC

## 2021-01-26 MED ORDER — ONDANSETRON HCL 4 MG/2ML IJ SOLN: 4.0000 mg | Freq: Once | INTRAMUSCULAR | Status: DC | PRN

## 2021-01-26 MED ORDER — ASPIRIN 81 MG PO CHEW: 81.0000 mg | CHEWABLE_TABLET | Freq: Once | ORAL | Status: DC

## 2021-01-26 MED ORDER — LACTATED RINGERS IV SOLN: INTRAVENOUS | Status: DC | PRN

## 2021-01-26 MED ORDER — ROCURONIUM BROMIDE 10 MG/ML (PF) SYRINGE
PREFILLED_SYRINGE | INTRAVENOUS | Status: DC | PRN
  Administered 2021-01-26 (×2): 10 mg via INTRAVENOUS
  Administered 2021-01-26: 100 mg via INTRAVENOUS

## 2021-01-26 MED ORDER — NITROGLYCERIN 1 MG/10 ML FOR IR/CATH LAB
INTRA_ARTERIAL | Status: AC
  Filled 2021-01-26: qty 10

## 2021-01-26 MED ORDER — ASPIRIN EC 81 MG PO TBEC
81.0000 mg | DELAYED_RELEASE_TABLET | Freq: Every day | ORAL | Status: DC
  Administered 2021-01-27: 81 mg via ORAL
  Filled 2021-01-26: qty 1

## 2021-01-26 MED ORDER — LIDOCAINE 2% (20 MG/ML) 5 ML SYRINGE
INTRAMUSCULAR | Status: DC | PRN
  Administered 2021-01-26: 60 mg via INTRAVENOUS

## 2021-01-26 MED ORDER — PHENYLEPHRINE HCL-NACL 10-0.9 MG/250ML-% IV SOLN
INTRAVENOUS | Status: DC | PRN
  Administered 2021-01-26: 50 ug/min via INTRAVENOUS

## 2021-01-26 MED ORDER — IOHEXOL 240 MG/ML SOLN
INTRAMUSCULAR | Status: AC
  Filled 2021-01-26: qty 100

## 2021-01-26 MED ORDER — ACETAMINOPHEN 160 MG/5ML PO SOLN: 650.0000 mg | ORAL | Status: DC | PRN

## 2021-01-26 MED ORDER — CLOPIDOGREL BISULFATE 75 MG PO TABS: 75.0000 mg | ORAL_TABLET | ORAL | Status: DC

## 2021-01-26 MED ORDER — DEXAMETHASONE SODIUM PHOSPHATE 10 MG/ML IJ SOLN
INTRAMUSCULAR | Status: DC | PRN
  Administered 2021-01-26: 4 mg via INTRAVENOUS

## 2021-01-26 MED ORDER — GLYCOPYRROLATE PF 0.2 MG/ML IJ SOSY
PREFILLED_SYRINGE | INTRAMUSCULAR | Status: DC | PRN
  Administered 2021-01-26: .2 mg via INTRAVENOUS

## 2021-01-26 MED ORDER — CHLORHEXIDINE GLUCONATE CLOTH 2 % EX PADS: 6.0000 | MEDICATED_PAD | Freq: Every day | CUTANEOUS | Status: DC

## 2021-01-26 MED ORDER — ONDANSETRON HCL 4 MG/2ML IJ SOLN
INTRAMUSCULAR | Status: DC | PRN
  Administered 2021-01-26: 4 mg via INTRAVENOUS

## 2021-01-26 MED ORDER — ALBUMIN HUMAN 5 % IV SOLN
INTRAVENOUS | Status: AC
  Filled 2021-01-26: qty 250

## 2021-01-26 MED ORDER — FENTANYL CITRATE (PF) 100 MCG/2ML IJ SOLN: 25.0000 ug | INTRAMUSCULAR | Status: DC | PRN

## 2021-01-26 MED ORDER — NICARDIPINE HCL IN NACL 20-0.86 MG/200ML-% IV SOLN
INTRAVENOUS | Status: AC
  Filled 2021-01-26: qty 200

## 2021-01-26 MED ORDER — CEFAZOLIN SODIUM-DEXTROSE 2-4 GM/100ML-% IV SOLN
2.0000 g | INTRAVENOUS | Status: AC
  Administered 2021-01-26: 2 g via INTRAVENOUS
  Filled 2021-01-26: qty 100

## 2021-01-26 MED ORDER — CLEVIDIPINE BUTYRATE 0.5 MG/ML IV EMUL
INTRAVENOUS | Status: AC
  Filled 2021-01-26: qty 50

## 2021-01-26 MED ORDER — ONDANSETRON HCL 4 MG/2ML IJ SOLN: 4.0000 mg | Freq: Four times a day (QID) | INTRAMUSCULAR | Status: DC | PRN

## 2021-01-26 MED ORDER — IOHEXOL 240 MG/ML SOLN
150.0000 mL | Freq: Once | INTRAMUSCULAR | Status: AC | PRN
  Administered 2021-01-26: 50 mL via INTRAVENOUS

## 2021-01-26 MED ORDER — ACETAMINOPHEN 650 MG RE SUPP: 650.0000 mg | RECTAL | Status: DC | PRN

## 2021-01-26 MED ORDER — ASPIRIN EC 325 MG PO TBEC: 325.0000 mg | DELAYED_RELEASE_TABLET | ORAL | Status: DC

## 2021-01-26 MED ORDER — TICAGRELOR 90 MG PO TABS
ORAL_TABLET | ORAL | Status: AC
  Administered 2021-01-26: 180 mg
  Filled 2021-01-26: qty 2

## 2021-01-26 MED ORDER — ASPIRIN EC 325 MG PO TBEC
DELAYED_RELEASE_TABLET | ORAL | Status: AC
  Filled 2021-01-26: qty 1

## 2021-01-26 NOTE — Procedures (Signed)
INTERVENTIONAL NEURORADIOLOGY BRIEF POSTPROCEDURE NOTE  DIAGNOSTIC CEREBRAL ANGIOGRAM AND LEFT CAROTID STENTING WITH CEREBRAL PROTECTION DEVICE  Attending: Dr. Pedro Earls  Assistant: None  Diagnosis: Left carotid web  Access site: [id number]  Access closure: Perclose Proglide  Anesthesia: General anesthesia   Medication used: 2g ancef. 5000 IU of heparin.  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 Stenting performed with 2 partially overlapping stents. No thromboembolic complication noted.  The patient tolerated the procedure well without incident or complication and is in stable condition.   PLAN: Bed rest for 6 hours. Continue on DAPT with ASA + brilinta.

## 2021-01-26 NOTE — Sedation Documentation (Signed)
Pt transported to PACU via stretcher accompanied by RN and CRNA. Report given to receiving PACU RN. No s/s of distress at this time. Groin site level 0 and pulses palpable on bilateral lower extremities.

## 2021-01-26 NOTE — Anesthesia Postprocedure Evaluation (Signed)
Anesthesia Post Note  Patient: Bradley Rivera  Procedure(s) Performed: IR WITH ANESTHESIA PTA INTERCRANIAL (N/A )     Patient location during evaluation: PACU Anesthesia Type: General Level of consciousness: awake and alert, oriented and patient cooperative Pain management: pain level controlled Vital Signs Assessment: post-procedure vital signs reviewed and stable Respiratory status: spontaneous breathing, nonlabored ventilation and respiratory function stable Cardiovascular status: blood pressure returned to baseline and stable Postop Assessment: no apparent nausea or vomiting Anesthetic complications: no   No complications documented.  Last Vitals:  Vitals:   01/26/21 1300 01/26/21 1315  BP: 121/61 126/65  Pulse:  61  Resp: 18 20  Temp:    SpO2: 100% 100%    Last Pain:  Vitals:   01/26/21 1245  TempSrc:   PainSc: 0-No pain                 Pervis Hocking

## 2021-01-26 NOTE — Sedation Documentation (Signed)
Procedure started. Anesthesia case 

## 2021-01-26 NOTE — Sedation Documentation (Signed)
2nd stent deployed on left carotid

## 2021-01-26 NOTE — Transfer of Care (Signed)
Immediate Anesthesia Transfer of Care Note  Patient: Bradley Rivera  Procedure(s) Performed: IR WITH ANESTHESIA PTA INTERCRANIAL (N/A )  Patient Location: PACU  Anesthesia Type:General  Level of Consciousness: drowsy and patient cooperative  Airway & Oxygen Therapy: Patient Spontanous Breathing and Patient connected to nasal cannula oxygen  Post-op Assessment: Report given to RN and Post -op Vital signs reviewed and stable  Post vital signs: Reviewed and stable  Last Vitals:  Vitals Value Taken Time  BP 156/88 01/26/21 1115  Temp    Pulse 66 01/26/21 1117  Resp 14 01/26/21 1117  SpO2 100 % 01/26/21 1117  Vitals shown include unvalidated device data.  Last Pain:  Vitals:   01/26/21 0647  TempSrc:   PainSc: 0-No pain         Complications: No complications documented.

## 2021-01-26 NOTE — Progress Notes (Signed)
NIR.  Patient underwent an image-guided cerebral arteriogram with left carotid web stenting (2 partially overlapping stents) via right femoral approach this AM by Dr. Karenann Cai.  Patient evaluated in PACU following procedure by this PA and Dr. Karenann Cai. Patient laying in bed resting comfortably. He responds to voice and answers questions appropriately. Denies headache, vision changes, dizziness, or N/V.  Alert, awake, and oriented x3. Speech and comprehension intact. PERRL bilaterally. No facial asymmetry. Tongue midline. Can spontaneously move all extremities. No pronator drift. Distal pulses (DPs) 1+ bilaterally. Right femoral puncture site soft with minimal ooze on dressing (stable per bedside RN), no active bleeding or hematoma noted.  Plan to transfer to neuro ICU for overnight observation. SBP = 578-469 systolic. Continue taking Brilinta 90 mg twice daily and Aspirin 81 mg once daily. Advance diet as tolerated. For possible discharge tomorrow if stable. NIR to follow.   Bradley Graff Catlynn Grondahl, PA-C 01/26/2021, 3:20 PM

## 2021-01-26 NOTE — Sedation Documentation (Signed)
Stent deployed on left carotid

## 2021-01-26 NOTE — H&P (Addendum)
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 Rory Percy  Supervising Physician: Pedro Earls  Patient Status: [facility] - Out-pt  History of Present Illness: Bradley Rivera is a 80 y.o. male  With history of colon, stomach, and prostate cancer presented to [facility] 01/13/21 with dizziness, right upper extremity weakness/numbness, slurred speech, and right-sided facial droop.  His initial CTA Head was negative, however showed area of ischemia in the L MCA as well as a potential left carotid web.  Patient's symptoms resolved and he was discharged home within 48 hrs with plans for Neurology and Neurointerventional Radiology follow-up.  Patient reportedly has transportation issues making it difficult for him to make multiple appointments.  In addition, he relies on family and or mail-service for medications.  He presents to [facility] today for treatment of his left carotid web.   Bradley Rivera is assessed in short stay this AM.  He tells me he never got his medications and has not been taking aspirin or Plavix at home.  He has a documented allergy to aspirin, however was able to tolerate eneteric-coated during his hospitalization and was supposed to be taking this AM home.  He reports the sensation in his right fingers did not return to baseline and there is residual numbness in the thumb and index finger. All other symptoms resolved and he has not had any recurrent dizziness, lightheaded, blurry vision, trouble speaking/walking, weakness, or numbness.  He has been NPO today.   Past Medical History:  Diagnosis Date  . Cancer ([place])    colon, stomach and prostate  . 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 GASTRECTOMY    . HERNIA REPAIR      Allergies: Lisinopril, Bromfenac, Nepafenac, Asa [aspirin], and Terazosin  Medications: Prior to  Admission medications   Medication Sig Start Date End Date Taking? Authorizing Provider  acetaminophen (TYLENOL) 500 MG tablet Take 1,000 mg by mouth every 6 (six) hours as needed for moderate pain.   Yes [provider]  allopurinol (ZYLOPRIM) 300 MG tablet Take 300 mg by mouth daily. 01/04/21  Yes [provider]  atorvastatin (LIPITOR) 80 MG tablet Take 40 mg by mouth at bedtime.   Yes [provider]  Calcium Citrate-Vitamin D (CITRUS CALCIUM/VITAMIN D) 200-250 MG-UNIT TABS Take 2 tablets by mouth 2 (two) times daily with a meal. 12/01/20  Yes [provider]  docusate sodium (COLACE) 50 MG capsule Take 50 mg by mouth daily.   Yes [provider]  DULoxetine (CYMBALTA) 30 MG capsule Take 30 mg by mouth at bedtime.   Yes [provider]  ferrous sulfate 324 MG TBEC Take 324 mg by mouth daily.   Yes [provider]  Menthol-Zinc Oxide (GOLD BOND EX) Apply 1 application topically daily.   Yes [provider]  Multiple Vitamin (MULTIVITAMIN WITH MINERALS) TABS tablet Take 1 tablet by mouth daily.   Yes [provider]  OVER THE COUNTER MEDICATION Take 1 capsule by mouth daily. CBD capsules   Yes [provider]  tamsulosin (FLOMAX) 0.4 MG CAPS capsule Take 0.8 mg by mouth daily after supper. 01/04/21  Yes [provider]  vitamin C (ASCORBIC ACID) 500 MG tablet Take 1,000 mg by mouth daily. 01/04/21  Yes [provider]  aspirin 81 MG EC tablet Take 1 tablet (81 mg total) by mouth daily. Swallow  whole. 01/21/21   Rai, Ripudeep K, MD  atorvastatin (LIPITOR) 40 MG tablet Take 1 tablet (40 mg total) by mouth daily. Patient not taking: No sig reported 01/21/21   Rai, Vernelle Emerald, MD  clopidogrel (PLAVIX) 75 MG tablet Take 1 tablet (75 mg total) by mouth daily. 01/21/21   Rai, Vernelle Emerald, MD  Liniments (BLUE-EMU SUPER STRENGTH) CREA Apply 1 application topically daily as needed (pain).    [provider]     History reviewed. No pertinent family history.  Social History   Socioeconomic History  . Marital status: Married    Spouse name: Not on file  . Number of children: Not on file  . Years of education: Not on file  . Highest education level: Not on file  Occupational History  . Not on file  Tobacco Use  . Smoking status: Current Some Day Smoker    Packs/day: 0.25    Types: Cigarettes  . Smokeless tobacco: Never Used  Vaping Use  . Vaping Use: Never used  Substance and Sexual Activity  . Alcohol use: Not on file    Comment: 1-2 per month  . Drug use: Never  . Sexual activity: Not on file  Other Topics Concern  . Not on file  Social History Narrative  . Not on file   Social Determinants of Health   Financial Resource Strain: Not on file  Food Insecurity: Not on file  Transportation Needs: Not on file  Physical Activity: Not on file  Stress: Not on file  Social Connections: Not on file     Review of Systems: A 12 point ROS discussed and pertinent positives are indicated in the HPI above.  All other systems are negative.  Review of Systems  Constitutional: Negative for fatigue and fever.  Respiratory: Negative for cough and shortness of breath.   Cardiovascular: Negative for chest pain.  Gastrointestinal: Negative for abdominal pain, nausea and vomiting.  Musculoskeletal: Negative for back pain.  Neurological: Positive for numbness (left thumb and pointer finger since CVA 01/13/21). Negative for dizziness, facial asymmetry, light-headedness and headaches.  Psychiatric/Behavioral: Negative for behavioral problems and confusion.    Vital Signs: BP (!) 177/84   Pulse 68   Temp (!) 97.5 F (36.4 C) (Oral)   Resp 17   Ht 6\' 1"  (1.854 m)   Wt 148 lb 9.4 oz (67.4 kg)   SpO2 98%   BMI 19.60 kg/m   Physical Exam Vitals and nursing note reviewed.  Constitutional:      General: He is not in acute distress.    Appearance: Normal appearance. He is  not ill-appearing.  HENT:     Mouth/Throat:     Mouth: Mucous membranes are moist.     Pharynx: Oropharynx is clear.  Cardiovascular:     Rate and Rhythm: Normal rate and regular rhythm.     Pulses: Normal pulses.     Comments: Faint palpable DP on the right Pulmonary:     Effort: Pulmonary effort is normal. No respiratory distress.     Breath sounds: Normal breath sounds.  Abdominal:     General: Abdomen is flat.     Palpations: Abdomen is soft.  Skin:    General: Skin is warm and dry.  Neurological:     General: No focal deficit present.     Mental Status: He is alert and oriented to person, place, and time. Mental status is at baseline.     Sensory: Sensory deficit (numbness left thumb  and pointer finger) present.  Psychiatric:        Mood and Affect: Mood normal.        Behavior: Behavior normal.        Thought Content: Thought content normal.        Judgment: Judgment normal.      MD Evaluation Airway: WNL Heart: WNL Abdomen: WNL Chest/ Lungs: WNL ASA  Classification: 3 Mallampati/Airway Score: Two   Imaging: CT Angio Head W or Wo Contrast  Result Date: 01/13/2021 CLINICAL DATA:  Right-sided weakness EXAM: CT ANGIOGRAPHY HEAD AND NECK CT PERFUSION BRAIN TECHNIQUE: Multidetector CT imaging of the head and neck was performed using the standard protocol during bolus administration of intravenous contrast. Multiplanar CT image reconstructions and MIPs were obtained to evaluate the vascular anatomy. Carotid stenosis measurements (when applicable) are obtained utilizing NASCET criteria, using the distal internal carotid diameter as the denominator. Multiphase CT imaging of the brain was performed following IV bolus contrast injection. Subsequent parametric perfusion maps were calculated using RAPID software. CONTRAST:  149mL OMNIPAQUE IOHEXOL 350 MG/ML SOLN COMPARISON:  None. FINDINGS: CTA NECK FINDINGS SKELETON: There is no bony spinal canal stenosis. No lytic or blastic  lesion. OTHER NECK: Normal pharynx, larynx and major salivary glands. No cervical lymphadenopathy. Unremarkable thyroid gland. UPPER CHEST: No pneumothorax or pleural effusion. No nodules or masses. AORTIC ARCH: There is moderate atherosclerosis of the aortic arch. There is no aneurysm, dissection or hemodynamically significant stenosis of the visualized portion of the aorta. Conventional 3 vessel aortic branching pattern. The visualized proximal subclavian arteries are widely patent. RIGHT CAROTID SYSTEM: Normal without aneurysm, dissection or stenosis. 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 No hemodynamically significant stenosis. VERTEBRAL ARTERIES: Left dominant configuration. 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 The left vertebral artery is normal along its entire course. CTA HEAD FINDINGS POSTERIOR CIRCULATION: --Vertebral arteries: Normal left.  The right terminates in PICA. --Inferior cerebellar arteries: Normal. --Basilar artery: Diminutive --Superior cerebellar arteries: Normal. --Posterior cerebral arteries (PCA): Normal. ANTERIOR CIRCULATION: --Intracranial internal carotid arteries: Normal. --Anterior cerebral arteries (ACA): Normal. Hypoplastic right A1 segment, normal variant. --Middle cerebral arteries (MCA): Normal. VENOUS SINUSES: As permitted by contrast timing, patent. ANATOMIC VARIANTS: Fetal origins of both posterior cerebral arteries. Review of the MIP images confirms the above findings. CT Brain Perfusion Findings: ASPECTS: 10 CBF (<30%) Volume: 68mL Perfusion (Tmax>6.0s) volume: 62mL Mismatch Volume: 54mL Infarction Location:No infarct. 4 mL region of ischemia identified in the left middle cerebral artery territory. IMPRESSION: 1. No emergent large vessel occlusion or high-grade stenosis of the intracranial arteries. 2. Perfusion study shows 4 mL region of acute ischemia in the left middle cerebral artery  territory without core infarct or intracranial hemorrhage. 3. 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 Aortic Atherosclerosis (ICD10-I70.0). Electronically Signed   By: Ulyses Jarred M.D.   On: 01/13/2021 00:56   CT HEAD WO CONTRAST  Result Date: 01/14/2021 CLINICAL DATA:  Stroke follow-up 24 hours status post tPA EXAM: CT HEAD WITHOUT CONTRAST TECHNIQUE: Contiguous axial images were obtained from the base of the skull through the vertex without intravenous contrast. COMPARISON:  None. FINDINGS: Brain: There is no mass, hemorrhage or extra-axial collection. The size and configuration of the ventricles and extra-axial CSF spaces are normal. Old left basal ganglia small vessel infarct. Vascular: No abnormal hyperdensity of the major intracranial arteries or dural venous sinuses. No intracranial atherosclerosis. Skull: The visualized skull base, calvarium and  extracranial soft tissues are normal. Sinuses/Orbits: No fluid levels or advanced mucosal thickening of the visualized paranasal sinuses. No mastoid or middle ear effusion. The orbits are normal. IMPRESSION: 1. No hemorrhage. 2. Old left basal ganglia small vessel infarct. Electronically Signed   By: Ulyses Jarred M.D.   On: 01/14/2021 02:01   CT Angio Neck W and/or Wo Contrast  Result Date: 01/13/2021 CLINICAL DATA:  Right-sided weakness EXAM: CT ANGIOGRAPHY HEAD AND NECK CT PERFUSION BRAIN TECHNIQUE: Multidetector CT imaging of the head and neck was performed using the standard protocol during bolus administration of intravenous contrast. Multiplanar CT image reconstructions and MIPs were obtained to evaluate the vascular anatomy. Carotid stenosis measurements (when applicable) are obtained utilizing NASCET criteria, using the distal internal carotid diameter as the denominator. Multiphase CT imaging of the brain was performed following IV bolus contrast injection. Subsequent parametric perfusion maps were  calculated using RAPID software. CONTRAST:  19mL OMNIPAQUE IOHEXOL 350 MG/ML SOLN COMPARISON:  None. FINDINGS: CTA NECK FINDINGS SKELETON: There is no bony spinal canal stenosis. No lytic or blastic lesion. OTHER NECK: Normal pharynx, larynx and major salivary glands. No cervical lymphadenopathy. Unremarkable thyroid gland. UPPER CHEST: No pneumothorax or pleural effusion. No nodules or masses. AORTIC ARCH: There is moderate atherosclerosis of the aortic arch. There is no aneurysm, dissection or hemodynamically significant stenosis of the visualized portion of the aorta. Conventional 3 vessel aortic branching pattern. The visualized proximal subclavian arteries are widely patent. RIGHT CAROTID SYSTEM: Normal without aneurysm, dissection or stenosis. 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 No hemodynamically significant stenosis. VERTEBRAL ARTERIES: Left dominant configuration. 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 The left vertebral artery is normal along its entire course. CTA HEAD FINDINGS POSTERIOR CIRCULATION: --Vertebral arteries: Normal left.  The right terminates in PICA. --Inferior cerebellar arteries: Normal. --Basilar artery: Diminutive --Superior cerebellar arteries: Normal. --Posterior cerebral arteries (PCA): Normal. ANTERIOR CIRCULATION: --Intracranial internal carotid arteries: Normal. --Anterior cerebral arteries (ACA): Normal. Hypoplastic right A1 segment, normal variant. --Middle cerebral arteries (MCA): Normal. VENOUS SINUSES: As permitted by contrast timing, patent. ANATOMIC VARIANTS: Fetal origins of both posterior cerebral arteries. Review of the MIP images confirms the above findings. CT Brain Perfusion Findings: ASPECTS: 10 CBF (<30%) Volume: 30mL Perfusion (Tmax>6.0s) volume: 61mL Mismatch Volume: 78mL Infarction Location:No infarct. 4 mL region of ischemia identified in the left middle cerebral artery territory.  IMPRESSION: 1. No emergent large vessel occlusion or high-grade stenosis of the intracranial arteries. 2. Perfusion study shows 4 mL region of acute ischemia in the left middle cerebral artery territory without core infarct or intracranial hemorrhage. 3. 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 Aortic Atherosclerosis (ICD10-I70.0). Electronically Signed   By: Ulyses Jarred M.D.   On: 01/13/2021 00:56   MR BRAIN WO CONTRAST  Result Date: 01/14/2021 CLINICAL DATA:  Sudden onset of right-sided weakness, slurred speech and dysarthria. EXAM: MRI HEAD WITHOUT CONTRAST TECHNIQUE: Multiplanar, multiecho pulse sequences of the brain and surrounding structures were obtained without intravenous contrast. COMPARISON:  Head CT earlier today. CT studies yesterday. MRI 06/27/2017. FINDINGS: Brain: Diffusion imaging shows a 3 cm region of acute infarction at the site of the perfusion abnormality affecting the cortical and subcortical brain at the left posterior frontal lobe. No swelling or hemorrhage. No other acute infarction. Elsewhere, the brainstem appears normal. There are a few old small vessel cerebellar infarctions. Cerebral hemispheres elsewhere show mild chronic small-vessel change of the white matter,  less than often seen at this age. No mass, hydrocephalus or extra-axial collection. Vascular: Major vessels at the base of the brain show flow. Skull and upper cervical spine: Negative Sinuses/Orbits: Clear/normal Other: None IMPRESSION: 1. 3 cm region of acute infarction affecting the cortical and subcortical brain at the site of the perfusion abnormality in the left posterior frontal lobe. No swelling or hemorrhage. 2. Mild chronic small-vessel ischemic changes elsewhere throughout the brain, less than often seen at this age. Electronically Signed   By: Nelson Chimes M.D.   On: 01/14/2021 18:02   CT CEREBRAL PERFUSION W CONTRAST  Result Date: 01/13/2021 CLINICAL DATA:   Right-sided weakness EXAM: CT ANGIOGRAPHY HEAD AND NECK CT PERFUSION BRAIN TECHNIQUE: Multidetector CT imaging of the head and neck was performed using the standard protocol during bolus administration of intravenous contrast. Multiplanar CT image reconstructions and MIPs were obtained to evaluate the vascular anatomy. Carotid stenosis measurements (when applicable) are obtained utilizing NASCET criteria, using the distal internal carotid diameter as the denominator. Multiphase CT imaging of the brain was performed following IV bolus contrast injection. Subsequent parametric perfusion maps were calculated using RAPID software. CONTRAST:  164mL OMNIPAQUE IOHEXOL 350 MG/ML SOLN COMPARISON:  None. FINDINGS: CTA NECK FINDINGS SKELETON: There is no bony spinal canal stenosis. No lytic or blastic lesion. OTHER NECK: Normal pharynx, larynx and major salivary glands. No cervical lymphadenopathy. Unremarkable thyroid gland. UPPER CHEST: No pneumothorax or pleural effusion. No nodules or masses. AORTIC ARCH: There is moderate atherosclerosis of the aortic arch. There is no aneurysm, dissection or hemodynamically significant stenosis of the visualized portion of the aorta. Conventional 3 vessel aortic branching pattern. The visualized proximal subclavian arteries are widely patent. RIGHT CAROTID SYSTEM: Normal without aneurysm, dissection or stenosis. 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 No hemodynamically significant stenosis. VERTEBRAL ARTERIES: Left dominant configuration. 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 The left vertebral artery is normal along its entire course. CTA HEAD FINDINGS POSTERIOR CIRCULATION: --Vertebral arteries: Normal left.  The right terminates in PICA. --Inferior cerebellar arteries: Normal. --Basilar artery: Diminutive --Superior cerebellar arteries: Normal. --Posterior cerebral arteries (PCA): Normal. ANTERIOR  CIRCULATION: --Intracranial internal carotid arteries: Normal. --Anterior cerebral arteries (ACA): Normal. Hypoplastic right A1 segment, normal variant. --Middle cerebral arteries (MCA): Normal. VENOUS SINUSES: As permitted by contrast timing, patent. ANATOMIC VARIANTS: Fetal origins of both posterior cerebral arteries. Review of the MIP images confirms the above findings. CT Brain Perfusion Findings: ASPECTS: 10 CBF (<30%) Volume: 31mL Perfusion (Tmax>6.0s) volume: 67mL Mismatch Volume: 38mL Infarction Location:No infarct. 4 mL region of ischemia identified in the left middle cerebral artery territory. IMPRESSION: 1. No emergent large vessel occlusion or high-grade stenosis of the intracranial arteries. 2. Perfusion study shows 4 mL region of acute ischemia in the left middle cerebral artery territory without core infarct or intracranial hemorrhage. 3. 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 Aortic Atherosclerosis (ICD10-I70.0). Electronically Signed   By: Ulyses Jarred M.D.   On: 01/13/2021 00:56   CT HEAD CODE STROKE WO CONTRAST  Result Date: 01/13/2021 CLINICAL DATA:  Code stroke.  Dizziness and right-sided weakness EXAM: CT HEAD WITHOUT CONTRAST TECHNIQUE: Contiguous axial images were obtained from the base of the skull through the vertex without intravenous contrast. COMPARISON:  None. FINDINGS: Brain: There is no mass, hemorrhage or extra-axial collection. There is generalized atrophy without lobar predilection. Areas of hypoattenuation of the deep gray nuclei and confluent periventricular white matter hypodensity,  consistent with chronic small vessel disease. Vascular: Atherosclerotic calcification of the internal carotid arteries at the skull base. No abnormal hyperdensity of the major intracranial arteries or dural venous sinuses. Skull: The visualized skull base, calvarium and extracranial soft tissues are normal. Sinuses/Orbits: No fluid levels or advanced  mucosal thickening of the visualized paranasal sinuses. No mastoid or middle ear effusion. The orbits are normal. ASPECTS [facility] Stroke Program Early CT Score) - Ganglionic level infarction (caudate, lentiform nuclei, internal capsule, insula, M1-M3 cortex): 7 - Supraganglionic infarction (M4-M6 cortex): 3 Total score (0-10 with 10 being normal): 10 IMPRESSION: 1. No acute intracranial abnormality. 2. ASPECTS is 10. 3. 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 These results were called by telephone at the time of interpretation on 01/13/2021 at 12:14 am to provider [facility] , who verbally acknowledged these results. Electronically Signed   By: Ulyses Jarred M.D.   On: 01/13/2021 00:16    Labs:  CBC: Recent Labs    01/12/21 2350 01/13/21 0406 01/26/21 0619  WBC 7.6 7.7 5.7  HGB 11.6* 11.9* 12.0*  HCT 35.9* 35.5* 37.2*  PLT 167 164 274    COAGS: Recent Labs    01/12/21 2350 01/26/21 0619  INR 1.0 1.1  APTT 39*  --     BMP: Recent Labs    01/12/21 2350 01/13/21 0406 01/26/21 0619  NA 140 139 139  K 4.2 4.2 4.0  CL 107 103 103  CO2 26 27 27   GLUCOSE 101* 93 102*  BUN 14 14 14   CALCIUM 9.0 8.9 9.5  CREATININE 1.06 1.00 1.15  GFRNONAA >60 >60 >60    LIVER FUNCTION TESTS: Recent Labs    01/12/21 2350  BILITOT 0.5  AST 25  ALT 17  ALKPHOS 70  PROT 7.0  ALBUMIN 3.4*    TUMOR MARKERS: No results for input(s): AFPTM, CEA, CA199, CHROMGRNA in the last 8760 hours.  Assessment and Plan: Patient with past medical history of CVA 01/13/21 presents with complaint of left carotid web.  IR consulted for evaluation and management of his left carotid web at the request of Dr. Rory Percy. Case reviewed by Dr. Karenann Cai who approves patient for procedure.  Patient presents today in their usual state of health. He has baseline numbness in his left thumb and point finger which have remained since his prior stroke.  Denies and new or concerning symptoms.   He has been NPO and is not currently on blood thinners. He was unable to get his medications as an outpatient and has not been taking aspirin or Plavix.  His P2Y12 is 202 this AM.  Per Dr. Karenann Cai will load with 180 mg Brilinta, 81 mg aspirin.  BP elevated this AM, 177/84.  Anesthesia aware.   Risks and benefits were discussed with the patient including, but not limited to bleeding, infection, vascular injury or contrast induced renal failure.  This interventional procedure involves the use of X-rays and because of the nature of the planned procedure, it is possible that we will have prolonged use of X-ray fluoroscopy.  Potential radiation risks to you include (but are not limited to) the following: - A slightly elevated risk for cancer  several years later in life. This risk is typically less than 0.5% percent. This risk is low in comparison to the normal incidence of human cancer, which is 33% for women and 50% for men according to the [place]. - Radiation induced injury can include skin redness, resembling a  rash, tissue breakdown / ulcers and hair loss (which can be temporary or permanent).   The likelihood of either of these occurring depends on the difficulty of the procedure and whether you are sensitive to radiation due to previous procedures, disease, or genetic conditions.   IF your procedure requires a prolonged use of radiation, you will be notified and given written instructions for further action.  It is your responsibility to monitor the irradiated area for the 2 weeks following the procedure and to notify your physician if you are concerned that you have suffered a radiation induced injury.    All of the patient's questions were answered, patient is agreeable to proceed.  Consent signed and in chart.  Thank you for this interesting consult.  I greatly enjoyed meeting Bradley Rivera and look forward to participating in their care.  A copy of this  report was sent to the requesting provider on this date.  Electronically Signed: Docia Barrier, PA 01/26/2021, 8:29 AM   I spent a total of  40 Minutes   in face to face in clinical consultation, greater than 50% of which was counseling/coordinating care for left carotid web.

## 2021-01-26 NOTE — Sedation Documentation (Signed)
Unable to advance foley catheter after insertion. Dr. Debbrah Alar notified. Per MD, use condom cath instead.

## 2021-01-26 NOTE — Anesthesia Procedure Notes (Signed)
Procedure Name: Intubation Date/Time: 01/26/2021 8:48 AM Performed by: Moshe Salisbury, CRNA Pre-anesthesia Checklist: Patient identified, Emergency Drugs available, Suction available and Patient being monitored Patient Re-evaluated:Patient Re-evaluated prior to induction Oxygen Delivery Method: Circle System Utilized Preoxygenation: Pre-oxygenation with 100% oxygen Induction Type: IV induction Ventilation: Mask ventilation without difficulty Laryngoscope Size: Mac and 4 Grade View: Grade II Tube type: Oral Number of attempts: 1 Airway Equipment and Method: Stylet Placement Confirmation: ETT inserted through vocal cords under direct vision,  positive ETCO2 and breath sounds checked- equal and bilateral Secured at: 23 cm Tube secured with: Tape Dental Injury: Teeth and Oropharynx as per pre-operative assessment

## 2021-01-26 NOTE — Sedation Documentation (Signed)
Sheath pulled. Perclose used as site closure.

## 2021-01-26 NOTE — Anesthesia Procedure Notes (Signed)
Arterial Line Insertion Start/End3/12/2020 8:50 AM, 01/26/2021 9:00 AM Performed by: Pervis Hocking, DO, anesthesiologist  Patient location: Pre-op. Preanesthetic checklist: patient identified, IV checked, site marked, risks and benefits discussed, surgical consent, monitors and equipment checked, pre-op evaluation, timeout performed and anesthesia consent Lidocaine 1% used for infiltration Left, radial was placed Catheter size: 20 Fr Hand hygiene performed  and maximum sterile barriers used   Attempts: 3 Procedure performed using ultrasound guided technique. Ultrasound Notes:anatomy identified, needle tip was noted to be adjacent to the nerve/plexus identified, no ultrasound evidence of intravascular and/or intraneural injection and image(s) printed for medical record Following insertion, dressing applied. Post procedure assessment: normal and unchanged  Patient tolerated the procedure well with no immediate complications.

## 2021-01-27 ENCOUNTER — Telehealth: Payer: Medicare PPO | Admitting: Internal Medicine

## 2021-01-27 ENCOUNTER — Other Ambulatory Visit: Payer: Self-pay | Admitting: Radiology

## 2021-01-27 DIAGNOSIS — I779 Disorder of arteries and arterioles, unspecified: Secondary | ICD-10-CM | POA: Diagnosis not present

## 2021-01-27 HISTORY — PX: IR ANGIO VERTEBRAL SEL VERTEBRAL UNI L MOD SED: IMG5367

## 2021-01-27 HISTORY — PX: IR US GUIDE VASC ACCESS RIGHT: IMG2390

## 2021-01-27 MED ORDER — ASPIRIN 81 MG PO TBEC: 81.0000 mg | DELAYED_RELEASE_TABLET | Freq: Every day | ORAL | 5 refills | Status: AC

## 2021-01-27 MED ORDER — TICAGRELOR 90 MG PO TABS
90.0000 mg | ORAL_TABLET | Freq: Two times a day (BID) | ORAL | 0 refills | Status: AC
Start: 2021-01-27 — End: ?

## 2021-01-27 MED FILL — ASPIRIN LOW DOSE 81 MG TBEC: 81 | 30 days supply | Qty: 30 | Fill #0

## 2021-01-27 NOTE — Discharge Summary (Signed)
Physician Discharge Summary      Patient ID: Bradley Rivera MRN: [id number] DOB/AGE: 02-08-1941 80 y.o.  Admit date: 01/26/2021 Discharge date: 01/27/2021  Admission Diagnoses: Active Problems:   Carotid arterial disease [facility])  Discharge Diagnoses:  Active Problems:   Carotid arterial disease ([place])    Procedures: Procedure(s): Cerebral arteriogram with left carotid web stenting (2 partially overlapping stents) on 01/26/21  Discharged Condition: good  Hospital Course: Admitted to Neuro ICU after successful procedure as above. POD #1 doing well. No c/o HA, N/V, vision changes. Not hungry but tolerated regular diet. BPs good. No neuro deficits, exam normal. Stable for discharge. Reviewed the importance of pt taking his medications, including Brilinta 90mg  BID and EC ASA 81 mg daily. Will give pt 30 days worth of Brilinta samples and have also faxed Rx for 90 day supply to [place].  Will schedule 3 month carotid duplex followed by clinic visit. Pt understands all instructions and precautions.    Discharge Exam: Blood pressure 136/66, pulse 65, temperature 98 F (36.7 C), temperature source Oral, resp. rate 12, height 6\' 1"  (1.854 m), weight 67.4 kg, SpO2 100 %. General: NAD, feels good. Lungs: CTA without w/r/r Heart: Regular Ext: (R)groin soft, NT Neuro: Alert, awake, and oriented x3. Speech and comprehension intact. PERRL bilaterally. No facial asymmetry. Tongue midline. Can spontaneously move all extremities. No pronator drift.   Disposition: Discharge disposition: 01-Home or Self Care       Discharge Instructions    Call MD for:  difficulty breathing, headache or visual disturbances   Complete by: As directed    Call MD for:  persistant nausea and vomiting   Complete by: As directed    Call MD for:  redness, tenderness, or signs of infection (pain, swelling, redness, odor or green/yellow discharge around incision site)   Complete by: As directed    Call  MD for:  severe uncontrolled pain   Complete by: As directed    Call MD for:  temperature >100.4   Complete by: As directed    Diet - low sodium heart healthy   Complete by: As directed    Diet general   Complete by: As directed    Increase activity slowly   Complete by: As directed    Remove dressing in 24 hours   Complete by: As directed      Allergies as of 01/27/2021      Reactions   Lisinopril Swelling   Bromfenac    Unknown reaction   Nepafenac Other (See Comments)   Unknown reaction   Asa [aspirin] Other (See Comments)   Per patient burning in stomach but he is willing  to try an enteric coated aspirin.     Terazosin Palpitations, Other (See Comments)      Medication List    STOP taking these medications   clopidogrel 75 MG tablet Commonly known as: PLAVIX     TAKE these medications   acetaminophen 500 MG tablet Commonly known as: TYLENOL Take 1,000 mg by mouth every 6 (six) hours as needed for moderate pain.   allopurinol 300 MG tablet Commonly known as: ZYLOPRIM Take 300 mg by mouth daily.   aspirin 81 MG EC tablet Take 1 tablet (81 mg total) by mouth daily. Swallow whole.   atorvastatin 80 MG tablet Commonly known as: LIPITOR Take 40 mg by mouth at bedtime. What changed: Another medication with the same name was removed. Continue taking this medication, and follow the directions you see here.  Blue-Emu Super Strength Crea Apply 1 application topically daily as needed (pain).   Citrus Calcium/Vitamin D 200-250 MG-UNIT Tabs Take 2 tablets by mouth 2 (two) times daily with a meal.   docusate sodium 50 MG capsule Commonly known as: COLACE Take 50 mg by mouth daily.   DULoxetine 30 MG capsule Commonly known as: CYMBALTA Take 30 mg by mouth at bedtime.   ferrous sulfate 324 MG Tbec Take 324 mg by mouth daily.   GOLD BOND EX Apply 1 application topically daily.   multivitamin with minerals Tabs tablet Take 1 tablet by mouth daily.   OVER THE  COUNTER MEDICATION Take 1 capsule by mouth daily. CBD capsules   tamsulosin 0.4 MG Caps capsule Commonly known as: FLOMAX Take 0.8 mg by mouth daily after supper.   ticagrelor 90 MG Tabs tablet Commonly known as: BRILINTA Take 1 tablet (90 mg total) by mouth 2 (two) times daily.   vitamin C 500 MG tablet Commonly known as: ASCORBIC ACID Take 1,000 mg by mouth daily.       Follow-up Information    de Sindy Messing, Erven Colla, MD. Go in 3 month(s).   Specialties: Radiology, Interventional Radiology Contact information: [place] [id number] [phone]               Signed: Ascencion Dike PA-C 01/27/2021, 10:24 AM

## 2021-01-27 NOTE — Discharge Instructions (Signed)
Cerebral Angiogram, Care After This sheet gives you information about how to care for yourself after your procedure. Your health care provider may also give you more specific instructions. If you have problems or questions, contact your health care provider. What can I expect after the procedure? After the procedure, it is common to have:  Bruising and tenderness at the catheter insertion site.  A mild headache. Follow these instructions at home: Insertion site care  Follow instructions from your health care provider about how to take care of the insertion site. Make sure you: ? Wash your hands with soap and water before and after you change your bandage (dressing). If soap and water are not available, use hand sanitizer. ? Change your dressing as told by your health care provider.  Do not take baths, swim, or use a hot tub until your health care provider approves. You may shower 24-48 hours after the procedure, or as told by your health care provider.  To clean your insertion site: ? Gently wash the site with plain soap and water. ? Pat the area dry with a clean towel. ? Do not rub the site. This may cause bleeding.  Do not apply powder or lotion to the site. Keep the site clean and dry. Infection signs Check your incision area every day for signs of infection. Check for:  Redness, swelling, or pain.  Fluid or blood.  Warmth.  Pus or a bad smell.   Activity  Do not drive for 24 hours if you were given a sedative during your procedure.  Rest as told by your health care provider.  Do not lift anything that is heavier than 10 lb (4.5 kg), or the limit that you are told, until your health care provider says that it is safe.  Return to your normal activities as told by your health care provider, usually in about a week. Ask your health care provider what activities are safe for you. General instructions  If your insertion site starts to bleed, lie flat and put pressure on the  site. If the bleeding does not stop, get help right away. This is a medical emergency.  Do not use any products that contain nicotine or tobacco, such as cigarettes, e-cigarettes, and chewing tobacco. If you need help quitting, ask your health care provider.  Take over-the-counter and prescription medicines only as told by your health care provider.  Drink enough fluid to keep your urine pale yellow. This helps flush the contrast dye from your body.  Keep all follow-up visits as directed by your health care provider. This is important.   Contact a health care provider if:  You have a fever or chills.  You have redness, swelling, or pain around your insertion site.  You have fluid or blood coming from your insertion site.  The insertion site feels warm to the touch.  You have pus or a bad smell coming from your insertion site.  You notice blood collecting in the tissue around the insertion site (hematoma). The hematoma may be painful to the touch. Get help right away if:  You have chest pain or trouble breathing.  You have severe pain or swelling at the insertion site.  The insertion area bleeds, and bleeding continues after 30 minutes of holding steady pressure on the site.  The arm or leg where the catheter was inserted is pale, cold, numb, tingling, or weak.  You have a rash.  You have any symptoms of a stroke. "BE FAST" is  an easy way to remember the main warning signs of a stroke: ? B - Balance. Signs are dizziness, sudden trouble walking, or loss of balance. ? E - Eyes. Signs are trouble seeing or a sudden change in vision. ? F - Face. Signs are sudden weakness or numbness of the face, or the face or eyelid drooping on one side. ? A - Arms. Signs are weakness or numbness in an arm. This happens suddenly and usually on one side of the body. ? S - Speech. Signs are sudden trouble speaking, slurred speech, or trouble understanding what people say. ? T - Time. Time to call  emergency services. Write down what time symptoms started.  You have other signs of a stroke, such as: ? A sudden, severe headache with no known cause. ? Nausea or vomiting. ? Seizure. These symptoms may represent a serious problem that is an emergency. Do not wait to see if the symptoms will go away. Get medical help right away. Call your local emergency services (911 in the [place] Do not drive yourself to the hospital. Summary  Bruising and tenderness at the insertion site are common.  Follow your health care provider's instructions about caring for your insertion site. Change dressing and clean the area as instructed.  If your insertion site bleeds, apply direct pressure until bleeding stops.  Return to your normal activities as told by your health care provider. Ask what activities are safe.  Rest and drink plenty of fluids. This information is not intended to replace advice given to you by your health care provider. Make sure you discuss any questions you have with your health care provider. Document Revised: 06/03/2019 Document Reviewed: 06/03/2019 Elsevier Patient Education  [place].

## 2021-01-27 NOTE — Progress Notes (Signed)
Discharge instructions and packet given to patient. Patient verbalizes understanding. Peripheral IV's removed. Patient ride coordinated with his [employer]. Medications coordinated with TOC.

## 2021-01-27 NOTE — Care Management (Signed)
TOC consult for medication assist.  Pt has received 30 day supply of Brillinta from provider; needs ECASA 81 mg for home.  IR PA has sent Rx to [facility] pharmacy to be filled; TOC pharmacy to deliver ASA when filled.   Reinaldo Raddle, RN, BSN  Trauma/Neuro ICU Case Manager [phone]

## 2021-02-01 ENCOUNTER — Telehealth: Payer: Self-pay | Admitting: Internal Medicine

## 2021-02-01 NOTE — Telephone Encounter (Signed)
No call back from [facility]- closing this encounter.

## 2021-02-01 NOTE — Telephone Encounter (Signed)
Bradley Rivera,  The patient no showed for his appointment with Dr. Caryl Comes on 3/3 because he was hospitalized with having a carotid stents placed the day prior.   Dr. 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   Thank you!

## 2021-02-01 NOTE — Telephone Encounter (Signed)
Unable to access note from visit or view complete avs. Please advise .

## 2021-02-09 ENCOUNTER — Institutional Professional Consult (permissible substitution): Payer: Medicare PPO | Admitting: Cardiology

## 2021-02-23 ENCOUNTER — Other Ambulatory Visit: Payer: Self-pay

## 2021-02-23 ENCOUNTER — Encounter: Payer: Self-pay | Admitting: Cardiology

## 2021-02-23 ENCOUNTER — Ambulatory Visit (INDEPENDENT_AMBULATORY_CARE_PROVIDER_SITE_OTHER): Payer: Medicare PPO | Admitting: Cardiology

## 2021-02-23 ENCOUNTER — Ambulatory Visit (INDEPENDENT_AMBULATORY_CARE_PROVIDER_SITE_OTHER): Payer: Medicare PPO

## 2021-02-23 VITALS — BP 118/70 | HR 72 | Ht 73.0 in | Wt 139.2 lb

## 2021-02-23 DIAGNOSIS — I639 Cerebral infarction, unspecified: Secondary | ICD-10-CM

## 2021-02-23 DIAGNOSIS — I779 Disorder of arteries and arterioles, unspecified: Secondary | ICD-10-CM | POA: Diagnosis not present

## 2021-02-23 NOTE — Patient Instructions (Addendum)
Medication Instructions:  Your physician recommends that you continue on your current medications as directed. Please refer to the Current Medication list given to you today. *If you need a refill on your cardiac medications before your next appointment, please call your pharmacy*  Lab Work: None ordered. If you have labs (blood work) drawn today and your tests are completely normal, you will receive your results only by: Marland Kitchen MyChart Message (if you have MyChart) OR . A paper copy in the mail If you have any lab test that is abnormal or we need to change your treatment, we will call you to review the results.  Testing/Procedures: Your physician has recommended that you wear a holter monitor. Holter monitors are medical devices that record the heart's electrical activity. Doctors most often use these monitors to diagnose arrhythmias. Arrhythmias are problems with the speed or rhythm of the heartbeat. The monitor is a small, portable device. You can wear one while you do your normal daily activities. This is usually used to diagnose what is causing palpitations/syncope (passing out).  You will wear a ZIO monitor for 14 days.  Follow-Up: At [facility], you and your health needs are our priority.  As part of our continuing mission to provide you with exceptional heart care, we have created designated Provider Care Teams.  These Care Teams include your primary Cardiologist (physician) and Advanced Practice Providers (APPs -  Physician Assistants and Nurse Practitioners) who all work together to provide you with the care you need, when you need it.  Your next appointment:   Your physician wants you to follow-up based on results of your heart monitor.  Your physician has recommended that you wear a Zio monitor. This monitor is a medical device that records the heart's electrical activity. Doctors most often use these monitors to diagnose arrhythmias. Arrhythmias are problems with the speed or rhythm  of the heartbeat. The monitor is a small device applied to your chest. You can wear one while you do your normal daily activities. While wearing this monitor if you have any symptoms to push the button and record what you felt. Once you have worn this monitor for the period of time provider prescribed (Usually 14 days), you will return the monitor device in the postage paid box. Once it is returned they will download the data collected and provide [place] with a report which the provider will then review and we will call you with those results. Important tips:  1. Avoid showering during the first 24 hours of wearing the monitor. 2. Avoid excessive sweating to help maximize wear time. 3. Do not submerge the device, no hot tubs, and no swimming pools. 4. Keep any lotions or oils away from the patch. 5. After 24 hours you may shower with the patch on. Take brief showers with your back facing the shower head.  6. Do not remove patch once it has been placed because that will interrupt data and decrease adhesive wear time. 7. Push the button when you have any symptoms and write down what you were feeling. 8. Once you have completed wearing your monitor, remove and place into box which has postage paid and place in your outgoing mailbox.  9. If for some reason you have misplaced your box then call our office and we can provide another box and/or mail it off for you.

## 2021-02-23 NOTE — Progress Notes (Signed)
Electrophysiology Office Note:    Date:  02/23/2021   ID:  Bradley Rivera, DOB 31-Jan-1941, MRN [id number]  PCP:  [place] Cardiologist:  No primary care provider on file.  [facility] Electrophysiologist:  Vickie Epley, MD   Referring MD: [facility]*   Chief Complaint: Stroke  History of Present Illness:    Bradley Rivera is a 80 y.o. male who presents for an evaluation of stroke at the request of Dr. Reesa Chew.  The patient was admitted to the hospital February 16 through January 15, 2021 with dizziness and right upper extremity weakness, slurred speech and facial droop.  He received TPA.  CTA suggested a perfusion defect in the left MCA territory.  MRI of the brain performed later confirmed posterior frontal left-sided CVA.  The case was discussed with the neurology and neuro interventional team who recommended an outpatient loop recorder.  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  Past Medical History:  Diagnosis Date  . 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 Stroke aborted by administration of thrombolytic agent [facility])     Past Surgical History:  Procedure Laterality Date  . GASTRECTOMY    . HERNIA REPAIR    . IR ANGIO VERTEBRAL SEL VERTEBRAL UNI L MOD SED  01/27/2021  . IR INTRAVSC STENT CERV CAROTID W/EMB-PROT MOD SED INCL ANGIO  01/26/2021      . IR STENT PLACEMENT ANTE CAROTID INC ANGIO  01/26/2021  . IR US GUIDE VASC ACCESS RIGHT  01/27/2021  . RADIOLOGY WITH ANESTHESIA N/A 01/26/2021   Procedure: IR WITH ANESTHESIA PTA INTERCRANIAL;  Surgeon: Pedro Earls, MD;  Location: [place];  Service: Radiology;  Laterality: N/A;    Current Medications: Current Meds  Medication Sig  . acetaminophen (TYLENOL) 500 MG tablet Take 1,000 mg by mouth every 6 (six) hours as needed for moderate pain.  Marland Kitchen allopurinol  (ZYLOPRIM) 300 MG tablet Take 300 mg by mouth daily.  Marland Kitchen aspirin 81 MG EC tablet Take 1 tablet (81 mg total) by mouth daily. Swallow whole.  Marland Kitchen atorvastatin (LIPITOR) 80 MG tablet Take 40 mg by mouth at bedtime.  . Calcium Citrate-Vitamin D (CITRUS CALCIUM/VITAMIN D) 200-250 MG-UNIT TABS Take 2 tablets by mouth 2 (two) times daily with a meal.  . docusate sodium (COLACE) 50 MG capsule Take 50 mg by mouth daily.  . DULoxetine (CYMBALTA) 30 MG capsule Take 30 mg by mouth at bedtime.  . ferrous sulfate 324 MG TBEC Take 324 mg by mouth daily.  . Liniments (BLUE-EMU SUPER STRENGTH) CREA Apply 1 application topically daily as needed (pain).  . Menthol-Zinc Oxide (GOLD BOND EX) Apply 1 application topically daily.  . Multiple Vitamin (MULTIVITAMIN WITH MINERALS) TABS tablet Take 1 tablet by mouth daily.  Marland Kitchen OVER THE COUNTER MEDICATION Take 1 capsule by mouth daily. CBD capsules  . tamsulosin (FLOMAX) 0.4 MG CAPS capsule Take 0.8 mg by mouth daily after supper.  . ticagrelor (BRILINTA) 90 MG TABS tablet Take 1 tablet (90 mg total) by mouth 2 (two) times daily.  . vitamin C (ASCORBIC ACID) 500 MG tablet Take 1,000 mg by mouth daily.     Allergies:   Lisinopril, Bromfenac, Nepafenac, Asa [aspirin], and Terazosin   Social History   Socioeconomic History  . Marital status: Married    Spouse name: Not  on file  . Number of children: Not on file  . Years of education: Not on file  . Highest education level: Not on file  Occupational History  . Not on file  Tobacco Use  . Smoking status: Current Some Day Smoker    Packs/day: 0.25    Types: Cigarettes  . Smokeless tobacco: Never Used  Vaping Use  . Vaping Use: Never used  Substance and Sexual Activity  . Alcohol use: Not on file    Comment: 1-2 per month  . Drug use: Never  . Sexual activity: Not on file  Other Topics Concern  . Not on file  Social History Narrative  . Not on file   Social Determinants of Health   Financial Resource  Strain: Not on file  Food Insecurity: Not on file  Transportation Needs: Not on file  Physical Activity: Not on file  Stress: Not on file  Social Connections: Not on file     Family History: The patient's family history is not on file.  ROS:   Please see the history of present illness.    All other systems reviewed and are negative.  EKGs/Labs/Other Studies Reviewed:    The following studies were reviewed today:  Prior records  01/26/2021 IR Vertebral FINDINGS: 1. Patent right common femoral artery with mild atherosclerotic changes. 2. Mild atherosclerotic changes of the right carotid bulb without hemodynamically significant stenosis. 3. Hypoplastic right A1/ACA with flash filling of the right ACA vascular tree with rapid washout from the contralateral ACA. Prominent posterior communicating artery noted. Right MCA vascular tree is unremarkable. 4. Normal course and caliber of the left vertebral artery. Small caliber basilar artery without evidence of significant atherosclerotic changes of focal stenosis, likely related to the presence of hypoplastic right vertebral artery and prominent bilateral posterior communicating arteries with hypoplastic bilateral P1 segments (fetal PCAs). 5. Atherosclerotic changes of the left carotid bifurcation mild stenosis at the origin of the left ECA. There is a prominent carotid web in the left carotid bulb with turbulent flow and contrast stasis. No hemodynamically significant stenosis. 6. Brisk opacification of the bilateral ACA, left ACA and left PCA vascular tree via left common carotid artery contrast injection. No evidence of intracranial stenosis, aneurysm or vascular Malformation.   01/14/2021 MRI Brain IMPRESSION: 1. 3 cm region of acute infarction affecting the cortical and subcortical brain at the site of the perfusion abnormality in the left posterior frontal lobe. No swelling or hemorrhage. 2. Mild chronic small-vessel  ischemic changes elsewhere throughout the brain, less than often seen at this age.    EKG:  The ekg ordered today demonstrates normal sinus rhythm with a first-degree AV delay.  Recent Labs: 01/12/2021: ALT 17 01/13/2021: Magnesium 1.8 01/15/2021: TSH 1.311 01/26/2021: BUN 14; Creatinine, Ser 1.15; Hemoglobin 12.0; Platelets 274; Potassium 4.0; Sodium 139  Recent Lipid Panel    Component Value Date/Time   CHOL 140 01/13/2021 0406   TRIG 47 01/13/2021 0406   HDL 61 01/13/2021 0406   CHOLHDL 2.3 01/13/2021 0406   VLDL 9 01/13/2021 0406   LDLCALC 70 01/13/2021 0406    Physical Exam:    VS:  BP 118/70 (BP Location: Left Arm, Patient Position: Sitting, Cuff Size: Normal)   Pulse 72   Ht 6\' 1"  (1.854 m)   Wt 139 lb 4 oz (63.2 kg)   BMI 18.37 kg/m     Wt Readings from Last 3 Encounters:  02/23/21 139 lb 4 oz (63.2 kg)  01/26/21 148 lb  9.4 oz (67.4 kg)  01/13/21 148 lb 9.4 oz (67.4 kg)     GEN:  Well nourished, well developed in no acute distress.  Thin HEENT: Normal NECK: No JVD; No carotid bruits LYMPHATICS: No lymphadenopathy CARDIAC: RRR, no murmurs, rubs, gallops RESPIRATORY:  Clear to auscultation without rales, wheezing or rhonchi  ABDOMEN: Soft, non-tender, non-distended MUSCULOSKELETAL:  No edema; No deformity  SKIN: Warm and dry NEUROLOGIC:  Alert and oriented x 3 PSYCHIATRIC:  Normal affect   ASSESSMENT:    1. Acute CVA (cerebrovascular accident) ([place])   2. Carotid artery disease, unspecified laterality, unspecified type ([place])    PLAN:    In order of problems listed above:  1. Stroke Patient with an acute stroke.  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  The neuro team believes this was the likely cause of his stroke given the turbulent flow through this area and contrast hang up in this area during their angiography.  Given this finding, I do not think immediate implant a loop recorder is necessary.  I would like to start with  a 2-week ZIO monitor to assess for any evidence of atrial fibrillation.  We will base follow-up on the results of the ZIO.    Medication Adjustments/Labs and Tests Ordered: Current medicines are reviewed at length with the patient today.  Concerns regarding medicines are outlined above.  Orders Placed This Encounter  Procedures  . LONG TERM MONITOR (3-14 DAYS)  . EKG 12-Lead   No orders of the defined types were placed in this encounter.    Signed, Lars Mage, MD, [facility]  02/23/2021 11:24 AM    Electrophysiology [place]

## 2021-03-09 DIAGNOSIS — I639 Cerebral infarction, unspecified: Secondary | ICD-10-CM | POA: Diagnosis not present

## 2021-03-09 DIAGNOSIS — I471 Supraventricular tachycardia: Secondary | ICD-10-CM | POA: Diagnosis not present

## 2021-03-16 ENCOUNTER — Telehealth: Payer: Self-pay | Admitting: Physician Assistant

## 2021-03-16 NOTE — Telephone Encounter (Addendum)
Received word from EMS that they went out to patient's home but wife said he is not home. She actually does not know where he works. (!) She does not know a contact number for him. She states it was actually his first day back. There is no employment information in his chart. Unfortunately we have no other way to reach him until he returns home. EMT [employer] will relay to wife the importance of seeking medical attention/summoning EMS as soon as he returns home from work. He will relay the absolute importance to wife that it is imperative that the patient NOT [place]. Also called [employer] back because fax did not come through yet. Representative Marcille Blanco said Abby will assist with trying to correct this.  Addendum: received fax which does confirm these pauses are legitimate - varying times also noted. I tried to call him once more and wife finally picked up. She states he doesn't get home til 67. I also relayed to her the importance that he call 911 when he gets home to be transported to the hospital for evaluation. She gave me another cell number to try, [phone], but did warn me his phone hasn't been working lately - this also just rings and goes to voicemail.   I have placed the fax strips by the cardmaster station in the cath lab and signed out to the cardiology fellow on call this evening to be on the lookout.   Elly Haffey PA-C

## 2021-03-16 NOTE — Telephone Encounter (Signed)
   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  I tried to call the patient's house multiple times and each time it rang and went to voicemail which was not set up. Spouse's number just rings and states "we're sorry, your call cannot be completed at this time - please try again later" after multiple attempts. No other DPR on file. Given the severity of these event monitor findings and inability to reach the patient, I contacted [facility] to send an EMS truck out to his house to check on him and transport to the hospital for further evaluation and management.   Also asked [employer] to fax copy of strips to the cath lab at [facility] - this is pending at this time. Will route to ordering provider so he is aware as well.  Charlie Pitter, PA-C

## 2021-03-17 NOTE — Telephone Encounter (Signed)
Spoke with patient to schedule appointment but he refused stating that he was going to relax and enjoy the rest of his life. He was appreciative for the call and stated that he was not coming in to be seen. He then hung up on me.

## 2021-03-17 NOTE — Telephone Encounter (Signed)
Staff message was sent to Dr. Claudie Revering nurse as well. Will reach out to patient to schedule appointment.

## 2021-03-17 NOTE — Telephone Encounter (Addendum)
I received sign-out this morning that patient never went to the hospital last night. His wife had warned me last night that he may not want to go. I reached out to him this morning and spoke with him by phone. I relayed the urgent critical results of his event monitor. (As below, patient confirmed to have multiple 6-10 second pauses with ventricular standstill/high grade AVB.) He acknowledged understanding of the results but absolutely does not want to come to the hospital. Despite informing him that he is having dangerous pauses in his heart beat, he states "there is nothing wrong with my heart." He is alert and oriented x3 and expresses ability to make this decision for himself. I discussed that this could be fatal/deadly and he stated, "what will be, will be." I relayed warning symptoms and potential effects of this severely decreased heart rate.We discussed this could be treated with a fairly straightforward procedure called a pacemaker. He reiterated his decision that he does not want to go to the hospital. I relayed the STRICT INSTRUCTION THAT HE IS NOT ALLOWED TO DRIVE AT THIS TIME. He acknowledged this recommendation then thanked me for calling.  Will route to [facility] triage to make him an ASAP appt in the office to discuss further. Please review medication list with him over the phone to make sure he is not taking any heart rate slowing medicines. He said he was not in our conversation today. Please call his pharmacy as well to double check.  If he is, these need to be stopped ASAP.Please reiterate importance of going to the hospital again when you speak to him. Will also route to ordering provider so he is aware.

## 2021-08-11 ENCOUNTER — Other Ambulatory Visit: Payer: Self-pay

## 2021-08-11 DIAGNOSIS — R0602 Shortness of breath: Secondary | ICD-10-CM | POA: Diagnosis not present

## 2021-08-11 DIAGNOSIS — R42 Dizziness and giddiness: Secondary | ICD-10-CM | POA: Insufficient documentation

## 2021-08-11 DIAGNOSIS — Z5321 Procedure and treatment not carried out due to patient leaving prior to being seen by health care provider: Secondary | ICD-10-CM | POA: Diagnosis not present

## 2021-08-11 DIAGNOSIS — R110 Nausea: Secondary | ICD-10-CM | POA: Insufficient documentation

## 2021-08-11 LAB — URINALYSIS, COMPLETE (UACMP) WITH MICROSCOPIC
Bilirubin Urine: NEGATIVE
Glucose, UA: NEGATIVE mg/dL
Hgb urine dipstick: NEGATIVE
Ketones, ur: NEGATIVE mg/dL
Leukocytes,Ua: NEGATIVE
Nitrite: NEGATIVE
Protein, ur: 30 mg/dL — AB
Specific Gravity, Urine: 1.014 (ref 1.005–1.030)
pH: 5 (ref 5.0–8.0)

## 2021-08-11 LAB — CBC
HCT: 39.6 % (ref 39.0–52.0)
Hemoglobin: 13.6 g/dL (ref 13.0–17.0)
MCH: 34.2 pg — ABNORMAL HIGH (ref 26.0–34.0)
MCHC: 34.3 g/dL (ref 30.0–36.0)
MCV: 99.5 fL (ref 80.0–100.0)
Platelets: 219 10*3/uL (ref 150–400)
RBC: 3.98 MIL/uL — ABNORMAL LOW (ref 4.22–5.81)
RDW: 16.3 % — ABNORMAL HIGH (ref 11.5–15.5)
WBC: 6.8 10*3/uL (ref 4.0–10.5)
nRBC: 0 % (ref 0.0–0.2)

## 2021-08-11 LAB — BASIC METABOLIC PANEL
Anion gap: 9 (ref 5–15)
BUN: 18 mg/dL (ref 8–23)
CO2: 28 mmol/L (ref 22–32)
Calcium: 9.6 mg/dL (ref 8.9–10.3)
Chloride: 101 mmol/L (ref 98–111)
Creatinine, Ser: 1.12 mg/dL (ref 0.61–1.24)
GFR, Estimated: >60 mL/min (ref >60)
Glucose, Bld: 103 mg/dL — ABNORMAL HIGH (ref 70–99)
Potassium: 4.1 mmol/L (ref 3.5–5.1)
Sodium: 138 mmol/L (ref 135–145)

## 2021-08-11 NOTE — ED Triage Notes (Addendum)
See first nure note- pt to ER via [facility]. Reports sitting at his desk at work, he put a cloth mask on and he suddenly got dizzy/ short of breath. No LOC. Reports at this time he is no longer dizzy or short of breath. Denies chest pain.

## 2021-08-11 NOTE — ED Triage Notes (Signed)
First Nurse : Pt brought in by [facility] sudden onset of dizziness. Had Nausea in ride over denies pain. Refused for EMS to start IV allergic to ASA.   195/100 no hx of hypertension

## 2021-08-12 ENCOUNTER — Emergency Department
Admission: EM | Admit: 2021-08-12 | Discharge: 2021-08-12 | Disposition: A | Discharge status: Home / Self Care | Payer: Medicare PPO | Attending: Emergency Medicine | Admitting: Emergency Medicine

## 2021-08-12 NOTE — ED Notes (Signed)
No answer when called several times from lobby 

## 2021-08-25 ENCOUNTER — Emergency Department
Admission: EM | Admit: 2021-08-25 | Discharge: 2021-08-25 | Disposition: A | Discharge status: Home / Self Care | Payer: Medicare PPO | Attending: Emergency Medicine | Admitting: Emergency Medicine

## 2021-08-25 ENCOUNTER — Other Ambulatory Visit: Payer: Self-pay

## 2021-08-25 ENCOUNTER — Emergency Department: Payer: Medicare PPO

## 2021-08-25 DIAGNOSIS — R413 Other amnesia: Secondary | ICD-10-CM | POA: Diagnosis not present

## 2021-08-25 DIAGNOSIS — Z85038 Personal history of other malignant neoplasm of large intestine: Secondary | ICD-10-CM | POA: Diagnosis not present

## 2021-08-25 DIAGNOSIS — Z7982 Long term (current) use of aspirin: Secondary | ICD-10-CM | POA: Diagnosis not present

## 2021-08-25 DIAGNOSIS — Z85028 Personal history of other malignant neoplasm of stomach: Secondary | ICD-10-CM | POA: Diagnosis not present

## 2021-08-25 DIAGNOSIS — R6889 Other general symptoms and signs: Secondary | ICD-10-CM

## 2021-08-25 DIAGNOSIS — Z8546 Personal history of malignant neoplasm of prostate: Secondary | ICD-10-CM | POA: Insufficient documentation

## 2021-08-25 DIAGNOSIS — F17210 Nicotine dependence, cigarettes, uncomplicated: Secondary | ICD-10-CM | POA: Diagnosis not present

## 2021-08-25 DIAGNOSIS — R4182 Altered mental status, unspecified: Secondary | ICD-10-CM | POA: Diagnosis present

## 2021-08-25 LAB — CBC
HCT: 35.5 % — ABNORMAL LOW (ref 39.0–52.0)
Hemoglobin: 12.1 g/dL — ABNORMAL LOW (ref 13.0–17.0)
MCH: 34.3 pg — ABNORMAL HIGH (ref 26.0–34.0)
MCHC: 34.1 g/dL (ref 30.0–36.0)
MCV: 100.6 fL — ABNORMAL HIGH (ref 80.0–100.0)
Platelets: 186 10*3/uL (ref 150–400)
RBC: 3.53 MIL/uL — ABNORMAL LOW (ref 4.22–5.81)
RDW: 16.1 % — ABNORMAL HIGH (ref 11.5–15.5)
WBC: 5.9 10*3/uL (ref 4.0–10.5)
nRBC: 0 % (ref 0.0–0.2)

## 2021-08-25 LAB — COMPREHENSIVE METABOLIC PANEL
ALT: 18 U/L (ref 0–44)
AST: 29 U/L (ref 15–41)
Albumin: 3.7 g/dL (ref 3.5–5.0)
Alkaline Phosphatase: 74 U/L (ref 38–126)
Anion gap: 7 (ref 5–15)
BUN: 13 mg/dL (ref 8–23)
CO2: 31 mmol/L (ref 22–32)
Calcium: 9.7 mg/dL (ref 8.9–10.3)
Chloride: 102 mmol/L (ref 98–111)
Creatinine, Ser: 1.09 mg/dL (ref 0.61–1.24)
GFR, Estimated: >60 mL/min (ref >60)
Glucose, Bld: 98 mg/dL (ref 70–99)
Potassium: 4.5 mmol/L (ref 3.5–5.1)
Sodium: 140 mmol/L (ref 135–145)
Total Bilirubin: 0.8 mg/dL (ref 0.3–1.2)
Total Protein: 7.3 g/dL (ref 6.5–8.1)

## 2021-08-25 NOTE — Discharge Instructions (Addendum)
Your lab tests and CT scan of the head today were all okay.  Please follow-up with your doctor for continued evaluation of your memory symptoms.

## 2021-08-25 NOTE — ED Triage Notes (Signed)
Pt here via [facility] from home with AMS. Pt did not remember this morning that his wife was out of town so his daughter called ems. Pt then later was able to remember. Pt has been having periods of confusion lately. Pt has hx of CVA, stomach, and pancreatic CA.   [id number] 152/67 100% RA 65

## 2021-08-25 NOTE — ED Notes (Signed)
Pt signed physical discharge form.

## 2021-08-25 NOTE — ED Provider Notes (Signed)
[facility] Emergency Department Provider Note  ____________________________________________  Time seen: Approximately 7:59 AM  I have reviewed the triage vital signs and the nursing notes.   HISTORY  Chief Complaint Altered Mental Status    HPI Bradley Rivera is a 80 y.o. male with a history of cancer, stroke who was brought to the ED due to an episode of forgetfulness this morning.  The patient works as a Animal nutritionist and lives at home with his wife.  Recently, 2 of his children picked up his wife to drive with her to [place] for her birthday.  When Bradley Rivera woke up this morning, he had briefly forgotten that his wife was not home, and so when he found that she was not in the house, he called his daughter who lives in [place].  Not being there with him, she called EMS to check on him.  She noted to EMS that he has had a few episodes of being forgetful lately.  The patient states that he is doing well, denies any acute symptoms.  No fevers chills nausea vomiting or pain.  No headache or vision changes, no motor weakness or paresthesias, no falls or trauma.  He feels totally back to normal and is felt that he needed some time to finish waking up.  He has been eating and drinking okay at home.  He is still going to work and denies ever getting lost or turned around but does agree that occasionally he has a moment of forgetfulness.    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 [facility])      Patient Active Problem List   Diagnosis Date Noted   Carotid arterial disease ([place]) 01/26/2021   Acute CVA (cerebrovascular accident) ([place]) 01/13/2021     Past Surgical History:  Procedure Laterality Date   GASTRECTOMY     HERNIA REPAIR     IR ANGIO VERTEBRAL SEL VERTEBRAL UNI L MOD SED  01/27/2021   IR INTRAVSC STENT CERV CAROTID W/EMB-PROT MOD SED INCL  ANGIO  01/26/2021       IR STENT PLACEMENT ANTE CAROTID INC ANGIO  01/26/2021   IR US GUIDE VASC ACCESS RIGHT  01/27/2021   RADIOLOGY WITH ANESTHESIA N/A 01/26/2021   Procedure: IR WITH ANESTHESIA PTA INTERCRANIAL;  Surgeon: Pedro Earls, MD;  Location: [place];  Service: Radiology;  Laterality: N/A;     Prior to Admission medications   Medication Sig Start Date End Date Taking? Authorizing Provider  acetaminophen (TYLENOL) 500 MG tablet Take 1,000 mg by mouth every 6 (six) hours as needed for moderate pain.    [provider]  allopurinol (ZYLOPRIM) 300 MG tablet Take 300 mg by mouth daily. 01/04/21   [provider]  aspirin 81 MG EC tablet TAKE 1 TABLET BY MOUTH DAILY SWALLOW WHOLE 01/27/21 01/27/22  Ascencion Dike, PA-C  atorvastatin (LIPITOR) 80 MG tablet Take 40 mg by mouth at bedtime.    [provider]  Calcium Citrate-Vitamin D (CITRUS CALCIUM/VITAMIN D) 200-250 MG-UNIT TABS Take 2 tablets by mouth 2 (two) times daily with a meal. 12/01/20   [provider]  docusate sodium (COLACE) 50 MG capsule Take 50 mg by mouth daily.    [provider]  DULoxetine (CYMBALTA) 30 MG capsule Take 30 mg by mouth at bedtime.    [provider]  ferrous sulfate 324 MG TBEC  Take 324 mg by mouth daily.    [provider]  Liniments (BLUE-EMU SUPER STRENGTH) CREA Apply 1 application topically daily as needed (pain).    [provider]  Menthol-Zinc Oxide (GOLD BOND EX) Apply 1 application topically daily.    [provider]  Multiple Vitamin (MULTIVITAMIN WITH MINERALS) TABS tablet Take 1 tablet by mouth daily.    [provider]  OVER THE COUNTER MEDICATION Take 1 capsule by mouth daily. CBD capsules    [provider]  tamsulosin (FLOMAX) 0.4 MG CAPS capsule Take 0.8 mg by mouth daily after supper. 01/04/21   [provider]  ticagrelor (BRILINTA) 90 MG TABS tablet Take 1 tablet (90 mg total) by  mouth 2 (two) times daily. 01/27/21   Ascencion Dike, PA-C  vitamin C (ASCORBIC ACID) 500 MG tablet Take 1,000 mg by mouth daily. 01/04/21   [provider]     Allergies Lisinopril, Bromfenac, Nepafenac, Asa [aspirin], and Terazosin   No family history on file.  Social History Social History   Tobacco Use   Smoking status: Every Day    Packs/day: 0.25    Types: Cigarettes   Smokeless tobacco: Never  Vaping Use   Vaping Use: Never used  Substance Use Topics   Drug use: Never    Review of Systems  Constitutional:   No fever or chills.  ENT:   No sore throat. No rhinorrhea. Cardiovascular:   No chest pain or syncope. Respiratory:   No dyspnea or cough. Gastrointestinal:   Negative for abdominal pain, vomiting and diarrhea.  Musculoskeletal:   Negative for focal pain or swelling All other systems reviewed and are negative except as documented above in ROS and HPI.  ____________________________________________   PHYSICAL EXAM:  VITAL SIGNS: ED Triage Vitals  Enc Vitals Group     BP 08/25/21 0724 (!) 153/75     Pulse Rate 08/25/21 0724 (!) 59     Resp 08/25/21 0724 16     Temp 08/25/21 0724 97.6 F (36.4 C)     Temp Source 08/25/21 0724 Oral     SpO2 08/25/21 0724 100 %     Weight 08/25/21 0726 162 lb 11.2 oz (73.8 kg)     Height 08/25/21 0726 6\' 1"  (1.854 m)     Head Circumference --      Peak Flow --      Pain Score 08/25/21 0725 0     Pain Loc --      Pain Edu? --      Excl. in [place]? --     Vital signs reviewed, nursing assessments reviewed.   Constitutional:   Alert and oriented. Non-toxic appearance. Eyes:   Conjunctivae are normal. EOMI. PERRL. ENT      Head:   Normocephalic and atraumatic.      Nose:   Normal.      Mouth/Throat:   Moist mucous membranes.      Neck:   No meningismus. Full ROM. Hematological/Lymphatic/Immunilogical:   No cervical lymphadenopathy. Cardiovascular:   RRR. Symmetric bilateral radial and DP pulses.  No murmurs. Cap  refill less than 2 seconds. Respiratory:   Normal respiratory effort without tachypnea/retractions. Breath sounds are clear and equal bilaterally. No wheezes/rales/rhonchi. Gastrointestinal:   Soft and nontender. Non distended. There is no CVA tenderness.  No rebound, rigidity, or guarding. Genitourinary:   deferred Musculoskeletal:   Normal range of motion in all extremities. No joint effusions.  No lower extremity tenderness.  No edema. Neurologic:  Normal speech and language.  Motor grossly intact. No acute focal neurologic deficits are appreciated.  Skin:    Skin is warm, dry and intact. No rash noted.  No petechiae, purpura, or bullae.  ____________________________________________    LABS (pertinent positives/negatives) (all labs ordered are listed, but only abnormal results are displayed) Labs Reviewed  CBC - Abnormal; Notable for the following components:      Result Value   RBC 3.53 (*)    Hemoglobin 12.1 (*)    HCT 35.5 (*)    MCV 100.6 (*)    MCH 34.3 (*)    RDW 16.1 (*)    All other components within normal limits  COMPREHENSIVE METABOLIC PANEL  URINALYSIS, COMPLETE (UACMP) WITH MICROSCOPIC  CBG MONITORING, ED   ____________________________________________   EKG  Interpreted by me Sinus rhythm rate of 59, normal axis.  First-degree AV block.  Poor R wave progression.  Normal ST segments and T waves.  No acute ischemic changes.  ____________________________________________    RADIOLOGY  CT HEAD WO CONTRAST (5MM)  Result Date: 08/25/2021 CLINICAL DATA:  Altered mental status, confusion EXAM: CT HEAD WITHOUT CONTRAST TECHNIQUE: Contiguous axial images were obtained from the base of the skull through the vertex without intravenous contrast. COMPARISON:  Brain MRI 01/14/2021, CT head 01/14/2021 FINDINGS: Brain: There is no acute intracranial hemorrhage, extra-axial fluid collection, or acute infarct. There is hypodensity in the region of the left precentral gyrus  consistent with prior infarct as seen on the prior MRI. There is unchanged mild global parenchymal volume loss with prominence of the ventricular system and extra-axial CSF spaces. There is no mass lesion. There is no midline shift. Vascular: There is calcification of the bilateral cavernous ICAs. Skull: Normal. Negative for fracture or focal lesion. Sinuses/Orbits: The imaged paranasal sinuses are clear. The imaged globes and orbits are unremarkable. Other: None. IMPRESSION: 1. No acute intracranial pathology. 2. 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 Electronically Signed   By: Valetta Mole M.D.   On: 08/25/2021 08:06    ____________________________________________   PROCEDURES Procedures  ____________________________________________    CLINICAL IMPRESSION / ASSESSMENT AND PLAN / ED COURSE  Medications ordered in the ED: Medications - No data to display  Pertinent labs & imaging results that were available during my care of the patient were reviewed by me and considered in my medical decision making (see chart for details).  Bradley Rivera was evaluated in Emergency Department on 08/25/2021 for the symptoms described in the history of present illness. He was evaluated in the context of the global COVID-19 pandemic, which necessitated consideration that the patient might be at risk for infection with the SARS-CoV-2 virus that causes COVID-19. Institutional protocols and algorithms that pertain to the evaluation of patients at risk for COVID-19 are in a state of rapid change based on information released by regulatory bodies including the CDC and federal and state organizations. These policies and algorithms were followed during the patient's care in the ED.   Patient presents with brief episode of disorientation, now resolved.  Denies any other acute symptoms, he is back to baseline.  Suspect this is age-related, which the patient also cites as his explanation for these events.  Due to his  medical history and age, will obtain screening work-up with CT head, labs.  If he remains asymptomatic he will be stable for discharge home.  Low suspicion for stroke, dissection, ACS PE AAA.  No evidence of pneumonia or UTI.  ----------------------------------------- 9:23 AM on 08/25/2021 -----------------------------------------  Work-up negative, patient remains asymptomatic.  Vital stable.  Suitable for discharge and outpatient follow-up.      ____________________________________________   FINAL CLINICAL IMPRESSION(S) / ED DIAGNOSES    Final diagnoses:  [place]     ED Discharge Orders     None       Portions of this note were generated with dragon dictation software. Dictation errors may occur despite best attempts at proofreading.    Carrie Mew, MD 08/25/21 [phone]

## 2022-06-17 ENCOUNTER — Ambulatory Visit
Admission: EM | Admit: 2022-06-17 | Discharge: 2022-06-17 | Disposition: A | Discharge status: Home / Self Care | Payer: Medicare PPO

## 2022-06-17 ENCOUNTER — Encounter: Payer: Self-pay | Admitting: Emergency Medicine

## 2022-06-17 ENCOUNTER — Ambulatory Visit (INDEPENDENT_AMBULATORY_CARE_PROVIDER_SITE_OTHER): Payer: Medicare PPO

## 2022-06-17 DIAGNOSIS — M79642 Pain in left hand: Secondary | ICD-10-CM

## 2022-06-17 DIAGNOSIS — M79645 Pain in left finger(s): Secondary | ICD-10-CM

## 2022-06-17 DIAGNOSIS — M19042 Primary osteoarthritis, left hand: Secondary | ICD-10-CM | POA: Diagnosis not present

## 2022-06-17 MED ORDER — PREDNISONE 20 MG PO TABS: 40.0000 mg | ORAL_TABLET | Freq: Every day | ORAL | 0 refills | Status: AC

## 2022-06-17 NOTE — ED Triage Notes (Signed)
Pt c/o left index finger pain. He states he can not straighten out his finger. Denies injury. Started yesterday.

## 2022-06-17 NOTE — Discharge Instructions (Addendum)
-  The x-ray shows you have some arthritis in your hand.  There are no fractures or dislocations. - You have a lot of swelling in your hand.  You should ice the hand every couple of hours and keep it elevated to help with the swelling. - I have sent prednisone which is a anti-inflammatory medicine to the pharmacy to help with the swelling and pain. - You may also take over-the-counter extra strength Tylenol for pain. - Symptoms should be improving over the next couple days but if they are not you should follow-up with your PCP or [facility].  You may have a condition requiring you to follow up with Orthopedics so please call one of the following office for appointment:   [facility] [address] [place], [place] [address] Phone: [phone]  [facility] [address] [place], [place] [address] Phone: [phone]

## 2022-06-17 NOTE — ED Provider Notes (Signed)
[facility]    CSN: [id number] Arrival date & time: 06/17/22  1107      History   Chief Complaint Chief Complaint  Patient presents with   Hand Pain    HPI Bradley Rivera is a 81 y.o. male presenting for pain and swelling of the MCP joint of the left hand.  Patient says it began yesterday.  He says he has a lot of pain when he tries to straighten his finger.  He has no pain when he flexes at this joint.  No numbness, weakness or tingling.  No injuries that he has been recently moving and has been doing a lot of lifting.  He has not treated condition in any way yet.  He says he has just been rubbing it but it has not helped.  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 [facility])     Patient Active Problem List   Diagnosis Date Noted   Carotid arterial disease ([address]) 01/26/2021   Acute CVA (cerebrovascular accident) ([place]) 01/13/2021    Past Surgical History:  Procedure Laterality Date   GASTRECTOMY     HERNIA REPAIR     IR ANGIO VERTEBRAL SEL VERTEBRAL UNI L MOD SED  01/27/2021   IR INTRAVSC STENT CERV CAROTID W/EMB-PROT MOD SED INCL ANGIO  01/26/2021       IR STENT PLACEMENT ANTE CAROTID INC ANGIO  01/26/2021   IR US GUIDE VASC ACCESS RIGHT  01/27/2021   RADIOLOGY WITH ANESTHESIA N/A 01/26/2021   Procedure: IR WITH ANESTHESIA PTA INTERCRANIAL;  Surgeon: Pedro Earls, MD;  Location: [place];  Service: Radiology;  Laterality: N/A;       Home Medications    Prior to Admission medications   Medication Sig Start Date End Date Taking? Authorizing Provider  acetaminophen (TYLENOL) 500 MG tablet Take 1,000 mg by mouth every 6 (six) hours as needed for moderate pain.   Yes [provider]  gabapentin (NEURONTIN) 100 MG capsule TAKE ONE CAPSULE BY MOUTH TWO TIMES A DAY FOR NERVE PAIN 10/16/21 10/17/22 Yes [provider]  predniSONE  (DELTASONE) 20 MG tablet Take 2 tablets (40 mg total) by mouth daily for 5 days. 06/17/22 06/22/22 Yes Danton Clap, PA-C  tamsulosin (FLOMAX) 0.4 MG CAPS capsule Take 0.8 mg by mouth daily after supper. 01/04/21  Yes [provider]  allopurinol (ZYLOPRIM) 300 MG tablet Take 300 mg by mouth daily. 01/04/21   [provider]  atorvastatin (LIPITOR) 80 MG tablet Take 40 mg by mouth at bedtime.    [provider]  Calcium Citrate-Vitamin D (CITRUS CALCIUM/VITAMIN D) 200-250 MG-UNIT TABS Take 2 tablets by mouth 2 (two) times daily with a meal. 12/01/20   [provider]  docusate sodium (COLACE) 50 MG capsule Take 50 mg by mouth daily.    [provider]  DULoxetine (CYMBALTA) 30 MG capsule Take 30 mg by mouth at bedtime.    [provider]  ferrous sulfate 324 MG TBEC Take 324 mg by mouth daily.    [provider]  Liniments (BLUE-EMU SUPER STRENGTH) CREA Apply 1 application topically daily as needed (pain).    [provider]  Menthol-Zinc Oxide (GOLD BOND EX) Apply 1 application topically daily.    [provider]  Multiple Vitamin (MULTIVITAMIN WITH MINERALS) TABS tablet Take 1 tablet by mouth daily.  [provider]  OVER THE COUNTER MEDICATION Take 1 capsule by mouth daily. CBD capsules    [provider]  ticagrelor (BRILINTA) 90 MG TABS tablet Take 1 tablet (90 mg total) by mouth 2 (two) times daily. 01/27/21   Ascencion Dike, PA-C  vitamin C (ASCORBIC ACID) 500 MG tablet Take 1,000 mg by mouth daily. 01/04/21   [provider]    Family History History reviewed. No pertinent family history.  Social History Social History   Tobacco Use   Smoking status: Every Day    Packs/day: 0.25    Types: Cigarettes   Smokeless tobacco: Never  Vaping Use   Vaping Use: Never used  Substance Use Topics   Drug use: Never     Allergies   Lisinopril, Bromfenac, Nepafenac, Asa [aspirin], and  Terazosin   Review of Systems Review of Systems  Musculoskeletal:  Positive for arthralgias and joint swelling.  Skin:  Negative for color change and wound.  Neurological:  Negative for weakness and numbness.     Physical Exam Triage Vital Signs ED Triage Vitals  Enc Vitals Group     BP      Pulse      Resp      Temp      Temp src      SpO2      Weight      Height      Head Circumference      Peak Flow      Pain Score      Pain Loc      Pain Edu?      Excl. in [place]?    No data found.  Updated Vital Signs BP 109/73 (BP Location: Left Arm)   Pulse 64   Temp 98.2 F (36.8 C) (Oral)   Resp 16   Ht '6\' 1"'$  (1.854 m)   Wt 162 lb 11.2 oz (73.8 kg)   SpO2 100%   BMI 21.47 kg/m   Physical Exam Vitals and nursing note reviewed.  Constitutional:      General: He is not in acute distress.    Appearance: Normal appearance. He is well-developed. He is not ill-appearing.  HENT:     Head: Normocephalic and atraumatic.  Eyes:     General: No scleral icterus.    Conjunctiva/sclera: Conjunctivae normal.  Cardiovascular:     Rate and Rhythm: Normal rate and regular rhythm.     Pulses: Normal pulses.  Pulmonary:     Effort: Pulmonary effort is normal. No respiratory distress.     Breath sounds: Normal breath sounds.  Musculoskeletal:     Cervical back: Neck supple.     Comments: Left hand: There is swelling and tenderness about the MCP joint of the first digit.  Reduced extension at this digit due to pain.  Full flexion.  Good strength and sensation.  Normal pulses.  Skin:    General: Skin is warm and dry.     Capillary Refill: Capillary refill takes less than 2 seconds.  Neurological:     General: No focal deficit present.     Mental Status: He is alert. Mental status is at baseline.     Motor: No weakness.     Gait: Gait normal.  Psychiatric:        Mood and Affect: Mood normal.        Behavior: Behavior normal.      UC Treatments / Results  Labs (all labs  ordered are listed, but  only abnormal results are displayed) Labs Reviewed - No data to display  EKG   Radiology DG Finger Index Left  Result Date: 06/17/2022 CLINICAL DATA:  Pain, no reported injury EXAM: LEFT INDEX FINGER 2+V COMPARISON:  None Available. FINDINGS: There is no evidence of fracture or dislocation. Mild osteoarthritic pattern arthrosis. Soft tissues are unremarkable. IMPRESSION: No fracture or dislocation of the left index finger. Mild osteoarthritic pattern arthrosis. Electronically Signed   By: Delanna Ahmadi M.D.   On: 06/17/2022 12:50    Procedures Procedures (including critical care time)  Medications Ordered in UC Medications - No data to display  Initial Impression / Assessment and Plan / UC Course  I have reviewed the triage vital signs and the nursing notes.  Pertinent labs & imaging results that were available during my care of the patient were reviewed by me and considered in my medical decision making (see chart for details).  81 year old male presenting for atraumatic pain and swelling of the left first MCP joint.  X-ray obtained today shows no fracture or dislocation.  There is mild OA pattern shown.  I discussed with him.  Suspect all of the moving has caused flareup of underlying OA.  We will treat at this time with prednisone for couple days since he cannot take NSAIDs.  Also reviewed Tylenol for pain, ice and elevation.  Reviewed following up with Ortho if no improvement over the next week or if symptoms worsen.  Final Clinical Impressions(s) / UC Diagnoses   Final diagnoses:  Pain of left hand  Osteoarthritis of left hand, unspecified osteoarthritis type     Discharge Instructions      -The x-ray shows you have some arthritis in your hand.  There are no fractures or dislocations. - You have a lot of swelling in your hand.  You should ice the hand every couple of hours and keep it elevated to help with the swelling. - I have sent prednisone which  is a anti-inflammatory medicine to the pharmacy to help with the swelling and pain. - You may also take over-the-counter extra strength Tylenol for pain. - Symptoms should be improving over the next couple days but if they are not you should follow-up with your PCP or [facility].  You may have a condition requiring you to follow up with Orthopedics so please call one of the following office for appointment:   [facility] [address] [place], [place] [address] Phone: [phone]  [facility] [address] [place], [place] [address] Phone: [phone]      ED Prescriptions     Medication Sig Dispense Auth. Provider   predniSONE (DELTASONE) 20 MG tablet Take 2 tablets (40 mg total) by mouth daily for 5 days. 10 tablet Gretta Cool      PDMP not reviewed this encounter.   Danton Clap, PA-C 06/17/22 1323

## 2022-06-22 DIAGNOSIS — M109 Gout, unspecified: Secondary | ICD-10-CM | POA: Diagnosis not present

## 2022-07-24 IMAGING — CT CT HEAD W/O CM
3 series · 16 of 47 positions shown, 19 images · non-contrast
Comparison: Brain MRI 01/14/2021, CT head 01/14/2021

CLINICAL DATA: Altered mental status, confusion

EXAM:
CT HEAD WITHOUT CONTRAST
TECHNIQUE: Contiguous axial images were obtained from the base of the skull
through the vertex without intravenous contrast.

[Series 2: head wo · axial · 0.47mm/px · z∈[+1181,+1321]mm · 10 of 34 slices shown, 13 images]
[im 3/34  brain]
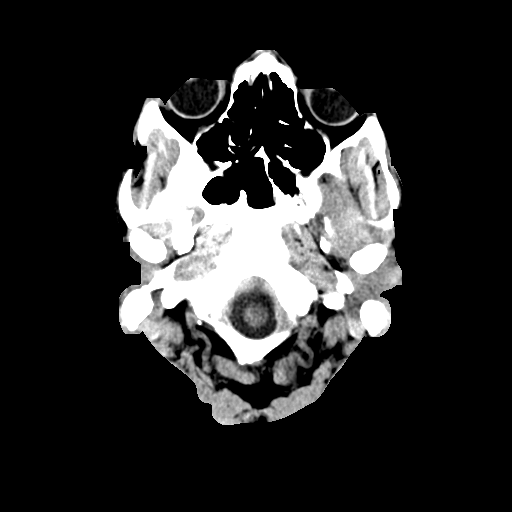
[im 3/34  bone]
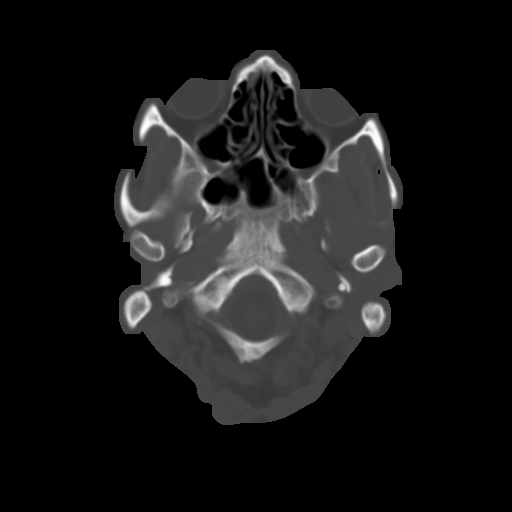
[im 6/34  brain]
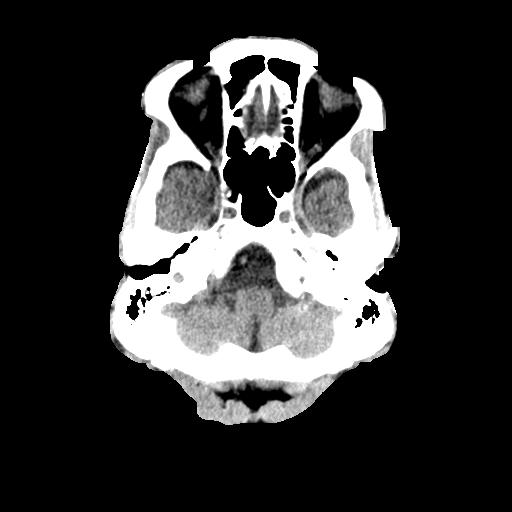
[im 10/34  brain]
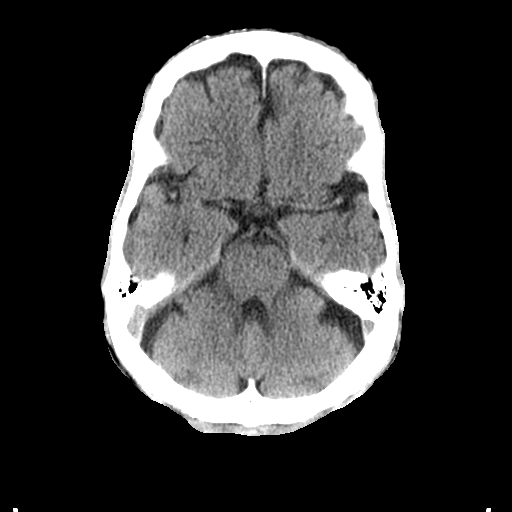
[im 12/34  brain]
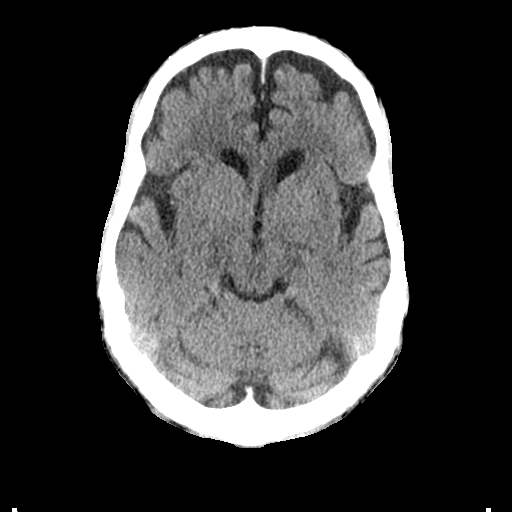
[im 15/34  brain]
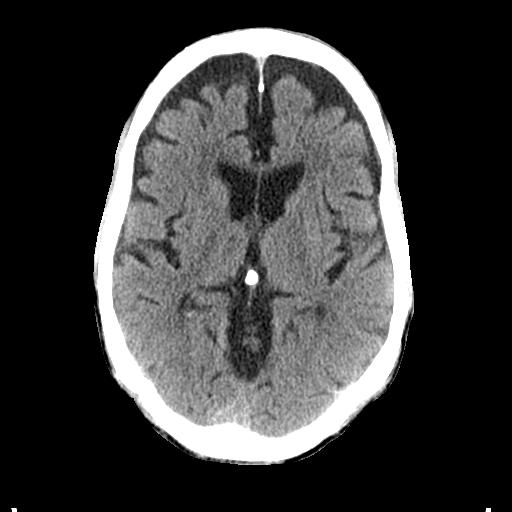
[im 15/34  bone]
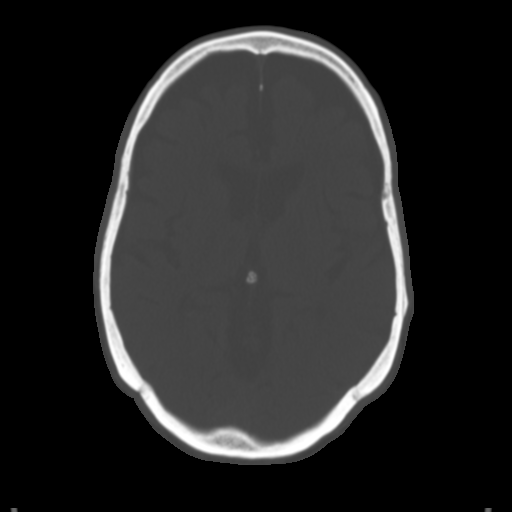
[im 19/34  brain]
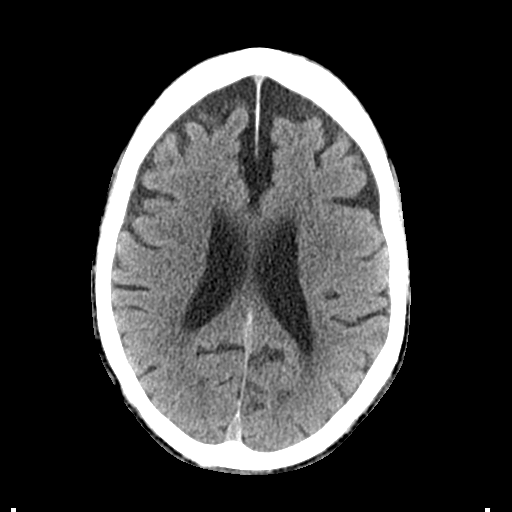
[im 22/34  brain]
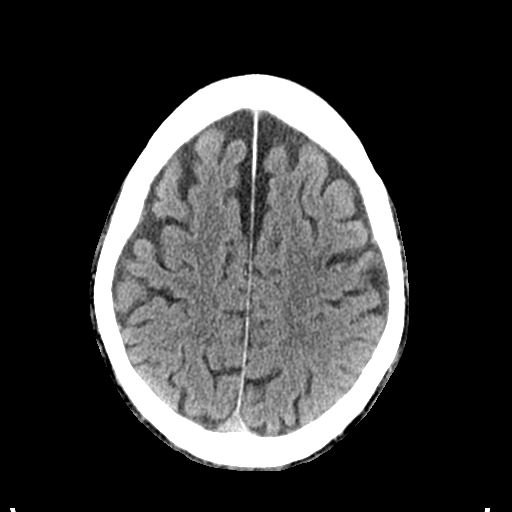
[im 26/34  brain]
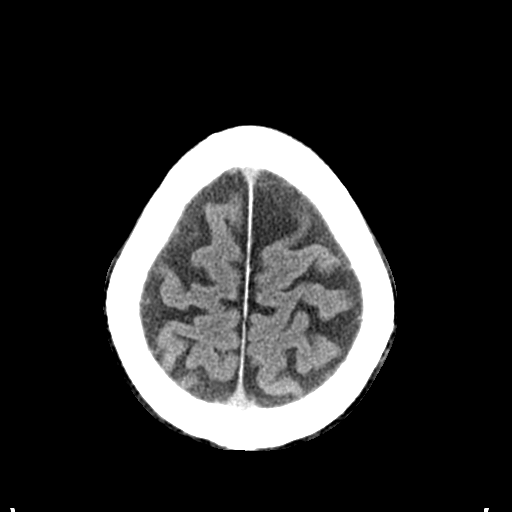
[im 28/34  brain]
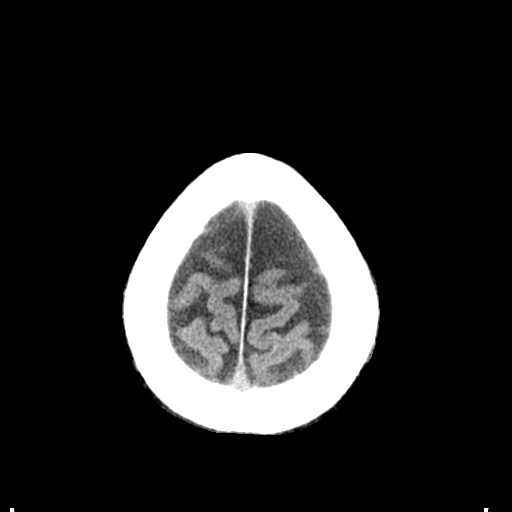
[im 28/34  bone]
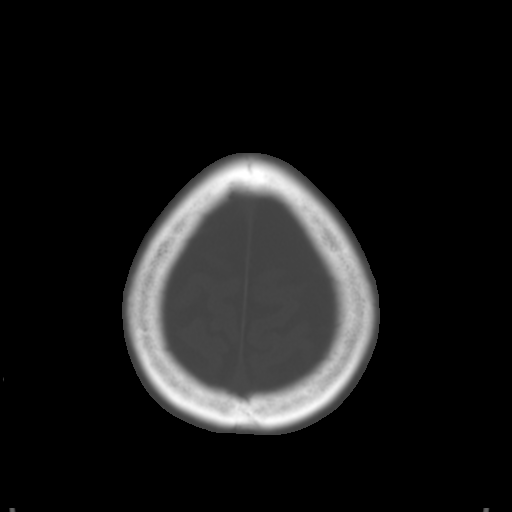
[im 31/34  brain]
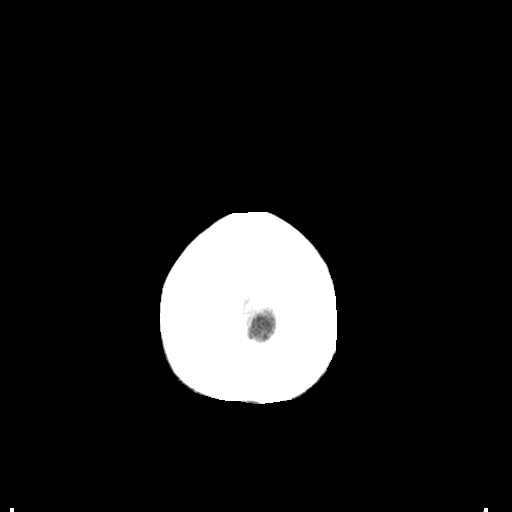

[Series 4: coronal soft tissue · coronal · 0.36mm/px · 3 of 76 slices shown]
[im 26/76  brain]
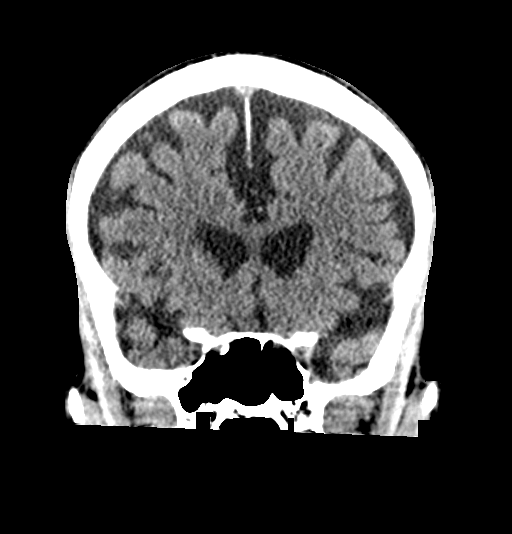
[im 34/76  brain]
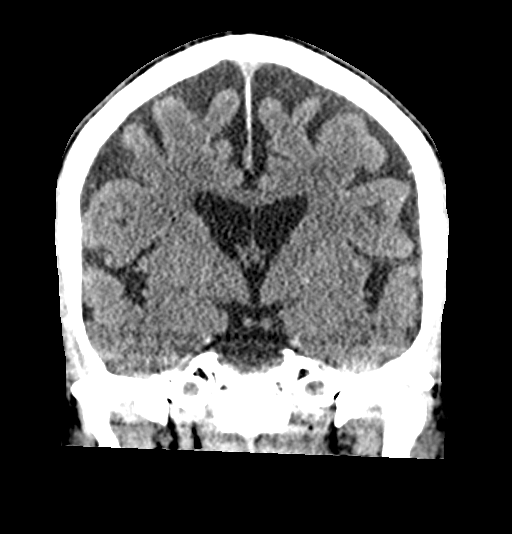
[im 42/76  brain]
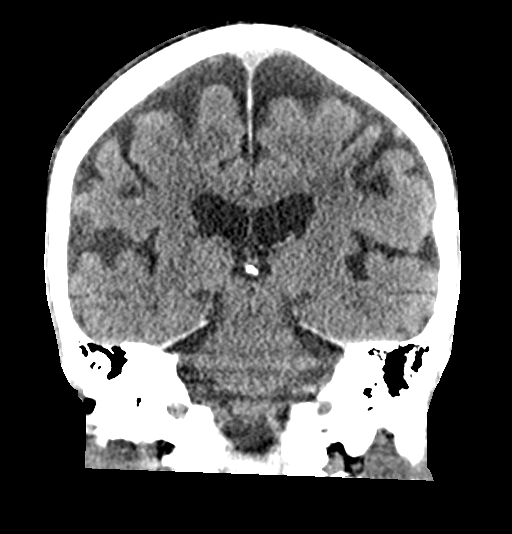

[Series 5: sagittal soft tissue · sagittal · 0.38mm/px · 3 of 57 slices shown]
[im 19/57  brain]
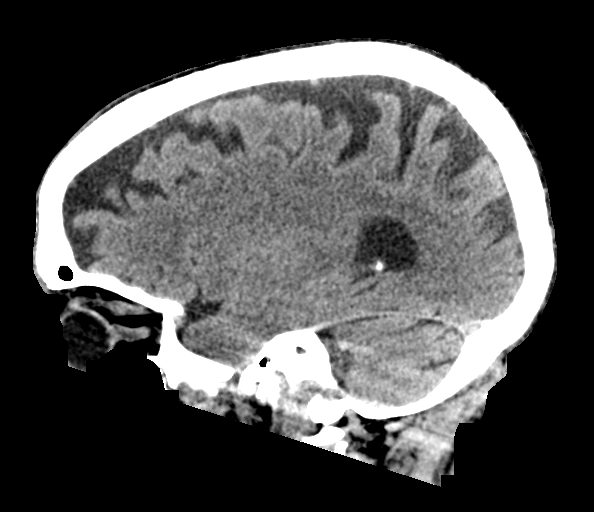
[im 29/57  brain]
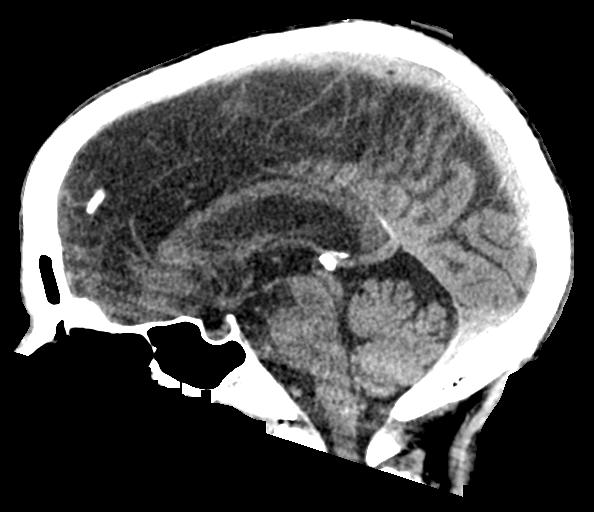
[im 38/57  brain]
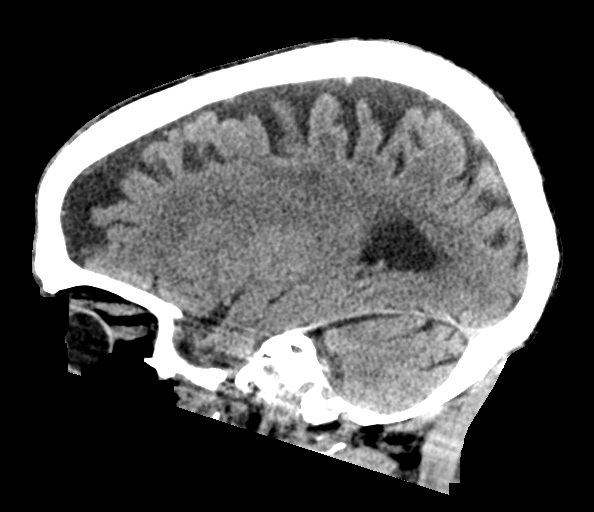

[16 of 47 positions shown; findings below may reference images not displayed]

FINDINGS: Brain: There is no acute intracranial hemorrhage, extra-axial fluid
collection, or acute infarct.

There is hypodensity in the region of the left precentral gyrus
consistent with prior infarct as seen on the prior MRI. There is
unchanged mild global parenchymal volume loss with prominence of the
ventricular system and extra-axial CSF spaces. There is no mass
lesion. There is no midline shift.

Vascular: There is calcification of the bilateral cavernous ICAs.

Skull: Normal. Negative for fracture or focal lesion.

Sinuses/Orbits: The imaged paranasal sinuses are clear. The imaged
globes and orbits are unremarkable.

Other: None.
IMPRESSION: 1. No acute intracranial pathology.
2. Remote infarct in the left frontal lobe.

## 2023-05-15 ENCOUNTER — Inpatient Hospital Stay: Admit: 2023-05-15 | Discharge: 2023-05-16 | Attending: Emergency Medicine

## 2023-05-15 ENCOUNTER — Encounter: Admit: 2023-05-15 | Payer: PRIVATE HEALTH INSURANCE

## 2023-05-15 NOTE — Unmapped
6:26 PM Pt BIBA from [facility] hospital with reports of hematoma/extravasation into R upper arm. Per pt, he fell two days ago and since then has been having increased R arm pain. Found to have active bleeding into R arm on [place] scan at [facility] hospital. Pt found to have hgb 7.1 on [place] labs. Pt arrives with 1 un RBC infusing. No s/s of rxn. GCS 15. Respirations even/unlabored. Pt reporting R arm pain. Pt also states he is hungry and very cold and is requesting to eat. Warm blankets applied. Pt instructed that MD will need to eval him first before he eats. Pt agreeable to plan. Awaiting MD eval. 7:28 PM blood transfusion complete. No s/s rxn. Pt awaiting MD eval. Verbal report given to oncoming RN, care deferred. BP (!) 136/56  - Pulse (!) 58  - Temp 97.2 ?F (36.2 ?C) (Temporal)  - Resp 18  - SpO2 97%  Chief Complaint Patient presents with  Medical Problem   Transferred from [facility] for surgery consult. Clot to upper right arm clot with bleed inside bicep. Fell on arm 3-4 days ago. 1 unit RBC in progress. Past Medical History: Diagnosis Date  Cancer (HC Code) 12/2013  They took all of my stomach out  Enlarged prostate   Melanoma (HC Code)   Stomach cancer (HC Code)

## 2023-05-16 LAB — CBC WITH AUTO DIFFERENTIAL
BKR WAM ABSOLUTE IMMATURE GRANULOCYTES.: 0.02 x 1000/ÂµL (ref 0.00–0.30)
BKR WAM ABSOLUTE IMMATURE GRANULOCYTES.: 0.02 x 1000/ÂµL (ref 0.00–0.30)
BKR WAM ABSOLUTE LYMPHOCYTE COUNT.: 1.6 x 1000/ÂµL (ref 0.60–3.70)
BKR WAM ABSOLUTE LYMPHOCYTE COUNT.: 1.97 x 1000/ÂµL (ref 0.60–3.70)
BKR WAM ABSOLUTE NRBC (2 DEC): 0 x 1000/ÂµL (ref 0.00–1.00)
BKR WAM ABSOLUTE NRBC (2 DEC): 0 x 1000/ÂµL (ref 0.00–1.00)
BKR WAM ANALYZER ANC: 3.72 x 1000/ÂµL (ref 2.00–7.60)
BKR WAM ANALYZER ANC: 4.08 x 1000/ÂµL (ref 2.00–7.60)
BKR WAM BASOPHIL ABSOLUTE COUNT.: 0.04 x 1000/ÂµL (ref 0.00–1.00)
BKR WAM BASOPHIL ABSOLUTE COUNT.: 0.02 x 1000/ÂµL (ref 0.00–1.00)
BKR WAM BASOPHILS: 0.6 % (ref 0.0–1.4)
BKR WAM BASOPHILS: 0.3 % (ref 0.0–1.4)
BKR WAM EOSINOPHIL ABSOLUTE COUNT.: 0.08 x 1000/ÂµL (ref 0.00–1.00)
BKR WAM EOSINOPHIL ABSOLUTE COUNT.: 0.09 x 1000/ÂµL (ref 0.00–1.00)
BKR WAM EOSINOPHILS: 1.3 % (ref 0.0–5.0)
BKR WAM EOSINOPHILS: 1.3 % (ref 0.0–5.0)
BKR WAM HEMATOCRIT (2 DEC): 23.9 % — ABNORMAL LOW (ref 38.50–50.00)
BKR WAM HEMATOCRIT (2 DEC): 26.2 % — ABNORMAL LOW (ref 38.50–50.00)
BKR WAM HEMOGLOBIN: 7.8 g/dL — ABNORMAL LOW (ref 13.2–17.1)
BKR WAM HEMOGLOBIN: 8.6 g/dL — ABNORMAL LOW (ref 13.2–17.1)
BKR WAM IMMATURE GRANULOCYTES: 0.3 % (ref 0.0–1.0)
BKR WAM IMMATURE GRANULOCYTES: 0.3 % (ref 0.0–1.0)
BKR WAM LYMPHOCYTES: 25.4 % (ref 17.0–50.0)
BKR WAM LYMPHOCYTES: 28 % (ref 17.0–50.0)
BKR WAM MCH (PG): 31.7 pg (ref 27.0–33.0)
BKR WAM MCH (PG): 32.3 pg (ref 27.0–33.0)
BKR WAM MCHC: 32.6 g/dL (ref 31.0–36.0)
BKR WAM MCHC: 32.8 g/dL (ref 31.0–36.0)
BKR WAM MCV: 97.2 fL (ref 80.0–100.0)
BKR WAM MCV: 98.5 fL (ref 80.0–100.0)
BKR WAM MONOCYTE ABSOLUTE COUNT.: 0.84 x 1000/ÂµL (ref 0.00–1.00)
BKR WAM MONOCYTE ABSOLUTE COUNT.: 0.86 x 1000/ÂµL (ref 0.00–1.00)
BKR WAM MONOCYTES: 13.3 % — ABNORMAL HIGH (ref 4.0–12.0)
BKR WAM MONOCYTES: 12.2 % — ABNORMAL HIGH (ref 4.0–12.0)
BKR WAM MPV: 10 fL (ref 8.0–12.0)
BKR WAM MPV: 10.1 fL (ref 8.0–12.0)
BKR WAM NEUTROPHILS: 59.1 % — ABNORMAL HIGH (ref 39.0–72.0)
BKR WAM NEUTROPHILS: 57.9 % (ref 39.0–72.0)
BKR WAM NUCLEATED RED BLOOD CELLS: 0 % (ref 0.0–1.0)
BKR WAM NUCLEATED RED BLOOD CELLS: 0 % (ref 0.0–1.0)
BKR WAM PLATELETS: 177 x1000/ÂµL (ref 150–420)
BKR WAM PLATELETS: 202 x1000/ÂµL (ref 150–420)
BKR WAM RDW-CV: 20.1 % — ABNORMAL HIGH (ref 11.0–15.0)
BKR WAM RDW-CV: 20.5 % — ABNORMAL HIGH (ref 11.0–15.0)
BKR WAM RED BLOOD CELL COUNT.: 2.46 M/ÂµL — ABNORMAL LOW (ref 4.00–6.00)
BKR WAM RED BLOOD CELL COUNT.: 2.66 M/ÂµL — ABNORMAL LOW (ref 4.00–6.00)
BKR WAM WHITE BLOOD CELL COUNT: 6.3 x1000/ÂµL (ref 4.0–11.0)
BKR WAM WHITE BLOOD CELL COUNT: 7 x1000/ÂµL (ref 4.0–11.0)

## 2023-05-16 LAB — BASIC METABOLIC PANEL
BKR ANION GAP: 7 (ref 7–17)
BKR BLOOD UREA NITROGEN: 18 mg/dL (ref 8–23)
BKR BUN / CREAT RATIO: 17.6 (ref 8.0–23.0)
BKR CALCIUM: 8.8 mg/dL (ref 8.8–10.2)
BKR CHLORIDE: 108 mmol/L — ABNORMAL HIGH (ref 98–107)
BKR CO2: 25 mmol/L (ref 20–30)
BKR CREATININE: 1.02 mg/dL (ref 0.40–1.30)
BKR EGFR, CREATININE (CKD-EPI 2021): >60 mL/min/{1.73_m2} (ref >=60)
BKR GLUCOSE: 89 mg/dL (ref 70–100)
BKR POTASSIUM: 4.3 mmol/L (ref 3.3–5.3)
BKR SODIUM: 140 mmol/L (ref 136–144)

## 2023-05-16 NOTE — Unmapped
Chief Complaint Patient presents with  Medical Problem   Transferred from [facility] for surgery consult. Clot to upper right arm clot with bleed inside bicep. Fell on arm 3-4 days ago. 1 unit RBC in progress. Mr. Magner is an 82 y/o male who is managed at the [place] presenting for a hematoma that he developed after a sustained fall 4 days ago. He was seen at the [place] where he underwent imaging that showed evidence of a bleed and extravasation into the right bicep. He was sent over to [facility] ED for further workup and evaluation with surgery. At this time surgery has evaluated him and don't believe that there's any further need for workup.External data reviewed: Notes (OSH or non-ED) and LabsDirectly spoke with:  Consultant - Discharge home Physical ExamED Triage Vitals [05/15/23 1806]BP: (!) 136/56Pulse: (!) 58Pulse from  O2 sat: n/aResp: 18Temp: 97.2 ?F (36.2 ?C)Temp src: TemporalSpO2: 97 % BP (!) 100/51  - Pulse 78  - Temp 98.3 ?F (36.8 ?C) (Oral)  - Resp 14  - SpO2 98% Physical ExamConstitutional:     Appearance: He is not ill-appearing. HENT:    Head: Normocephalic.    Mouth/Throat:    Mouth: Mucous membranes are moist. Eyes:    Pupils: Pupils are equal, round, and reactive to light. Cardiovascular:    Rate and Rhythm: Normal rate and regular rhythm. Pulmonary:    Effort: Pulmonary effort is normal.    Breath sounds: Normal breath sounds. Abdominal:    General: Abdomen is flat. Bowel sounds are normal. Musculoskeletal:       General: Swelling present.    Cervical back: Normal range of motion.    Comments: Right upper arm ecchymosis  Skin:   Findings: Bruising and erythema present. Neurological:    General: No focal deficit present.    Mental Status: He is alert and oriented to person, place, and time. Psychiatric:       Mood and Affect: Mood normal.  -------------------2:00 AM - I received sign-out for this patient from Dr. Mariane Masters. Cleared for dc by surgery team, no extrav on their review of [facility] images. H/H stable. Patient agreeable with plan for dc home with outpatient follow up, strict return precautions given.-John Ree Kida) Fidel Levy, MDPGY-3 Emergency MedicineProceduresAttestation/Critical CarePatient Reevaluation: Attending Supervised: ResidentI saw and examined the patient. I agree with the findings and plan of care as documented in the resident's note except as noted below. Additional acute and/or chronic problems addressed: Pt transferred here from [facility] who was transferred here for evaluation extravasation of vessel of RUE injury.States the injury happened proximally 3 days ago when he fell and grabbed something with his arm.  He has not remember any other specific trauma to it.  It swelled up and continued to be painful so he went to the [place] to be seen.He denies any other injuries.Per review of record he was evaluated at the [facility] and was noted to have an extravasation on an imaging study, that is not available to [place], and he was accepted by vascular surgery for evaluation.Other than arm pain he has no other specific complaints.Exam as notedRight upper extremity is wrapped tightly with a bandage, which we did not take down as we are pending vascular surgery evaluation.  He has a distal pulse and good cap refill distally with normal sensation to light touch.He is bruising of his upper extremity above the bandage.Medical decision-making: ?  Vascular injury of unclear etiology.  This may be a venous versus arterial injury, unclear.  Pending vascular  surgery evaluation and management.Onalee Hua Della-GiustinaCase was discussed with vascular surgery who recommended trauma surgery evaluation and not vascular.  We have consulted Trauma surgery.Care turned over the oncoming team at 11:00 p.m. pending Trauma surgery evaluation and final disposition.Pauline Aus, [phone]  received in sign-out.  Cleared by Surgical Service.  Repeat hemoglobin stable.Cameron Proud MD MPHClinical Impressions as of 05/16/23 1421 Hematoma  ED DispositionDischarge Edukugho, Derrek, MDResident06/18/24 2352 Rudean Hitt, MDResident06/19/24 0210 Cameron Proud, MD06/19/24 0408 Pauline Aus, MD06/19/24 1421

## 2023-05-16 NOTE — Unmapped
7:22 PM Report received from previous RN. This RN assumes care of the patient. 8:19 PM71 yr M with PMH of CVA, CAD, hernia repair, gastrectomy, and cancer presenting for right upper extremity hematoma and extravasation with swelling and ecchymosis after fall x 4 days ago. Patient was seen at [facility] earlier for complaint, where concern of active bleed in arm was present. Patient given 1 unit of blood while at [facility] for Hgb 7.1 and transferred to [facility] for further evaluation. Patient is alert and oriented x 4. He has equal rise and fall of the chest wall with clear lung sounds. Patient is requesting food and drink, but informed to wait until ER Provider evaluation. Pending ER Provider evaluation. Bed in lowest position. Blood unit completed and removed at 2020. 8:55 PMLab work drawn and sent. Patient in NAD. No needs voiced by patient at this time. Patient provided extra warm blankets. Pending lab work results. Pending surgical consult. Bed in lowest position. 9:30 PMPatient resting in stretcher with equal rise and fall of the chest wall. Patient in NAD. No needs voiced by patient at this time. Bed in lowest position. Pending surgical consult. 11:23 PMSurgery MD at bedside. 12:25 AMPatient resting with equal rise and fall of the chest wall. Patient in NAD. No needs voiced by patient at this time. Bed in lowest position. Plan of care ongoing. 1:43 AMPatient labs drawn and sent. Patient given food and drink per request. 2:33 AMDischarge instructions discussed with patient. Patient voices understanding of discharge instructions. Denies any questions, needs or concerns regarding discharge. Patient stable at discharge. He has steady and equal gait. Declined wheelchair. Cab booked for patient by SW.

## 2023-05-16 NOTE — Unmapped
You were seen today in the [facility] Emergency Department for a hematoma in your arm. Your imaging was reviewed by the surgeons, who have cleared you for discharge. Your lab tests do not show any dangerous findings, and you are safe to go home. Please follow up with your primary care doctor.Please return to the ER if you develop worsening arm pain or swelling, numbness or weakness in the arm, fever not controlled by Tylenol or ibuprofen, confusion or strange behavior, new or increased pain, difficulty breathing, uncontrollable vomiting, or any other new or concerning symptoms. We are always open.Please follow up with your doctor as soon as possible about your symptoms.A copy of your tests is provided with your discharge papers. If you have any problems with your prescriptions, or any problems after discharge, there is a nurse available Monday through Friday from 7:00am-3:30pm at [phone] to help answer any questions. You can also call the Emergency Department directly at [phone].

## 2023-05-16 NOTE — Unmapped
Called for cab for patient [phone].

## 2023-05-19 NOTE — Unmapped
[facility]   Trauma Surgery Consult NoteAttending Provider: Cameron Proud, MDCode status: No OrderAllergy: AspirinReason for Consult Surgery is consulted for traumatic fall 6/12 with right upper arm ecchymosis/swelling concern for hematomaHPI Patient is a 82 y.o. male, with PMH significant for peripheral arterial disease who presented 05/15/2023 brought in by ambulance from the [facility] for evaluation of right arm swelling/ecchymosis concerning for hematoma.  He had a mechanical fall on 05/09/23 landing on his right arm.  He denies loss of consciousness, presyncopal symptoms, or head strike.  He subsequently presented to the [facility] ED a few days later for chronic lower limb pain found to have chronic lower limb ischemia for which he was admitted to vascular surgery team.  During his brief hospital stay, vascular team noticed swelling of his right arm and ordered a CTA of his right arm which showed no active extravasation.  The patient subsequently left AMA on 06/17 as he refused an elective angio to evaluate the chronic limb ischemia.  He then re-presented to the [facility] ED earlier today with complaints of persistent right arm swelling for which a repeat CTA of his right arm was read as blush from small vessel in right bicep for which he was transferred to [facility] for trauma evaluation.  While at the [place], it was also noticed that his hemoglobin was 7.2 (slightly down from 8.0 a few days earlier) for which he received 1 unit of blood on the ambulance ride over to [place].  In the ED, his hemoglobin was 7.8, vital signs stable on room air.Review of Systems Pertinent review of systems as stated abovePast medical history:  has a past medical history of Cancer (HC Code) (12/2013), Enlarged prostate, Melanoma (HC Code), and Stomach cancer (HC Code).Past surgical history:  has a past surgical history that includes Abdominal surgery; Gastrectomy; and Inguinal hernia repair (Bilateral).Family history: family history is not on file.Social history:  reports that he has quit smoking. He does not have any smokeless tobacco history on file. He reports current alcohol use. He reports that he does not use drugs.Medications: No current facility-administered medications on file prior to encounter. Current Outpatient Medications on File Prior to Encounter Medication Sig Dispense Refill  METFORMIN HCL (METFORMIN ORAL) Take by mouth.    TAMSULOSIN HCL (TAMSULOSIN ORAL) Take by mouth.   Objective: Temp:  [97.2 ?F (36.2 ?C)-99 ?F (37.2 ?C)] 99 ?F (37.2 ?C)Pulse:  [58-59] 58Resp:  [16-18] 16BP: (107-136)/(51-56) 120/54SpO2:  [94 %-99 %] 94 %Device (Oxygen Therapy): room airNo intake/output data recorded.PHYSICAL EXAMHEENT: NC/AT. No bony stepoffs, tenderness, or deformity of the scalp, forehead, orbital rims, zygomata, maxilla, or mandible. PERRL. EOMI. No hemotympanum bilat. No raccoon eyes. No Battle's sign. Nasal septum midline. No nasal septal hematoma. No midface instability. No abrasions, lacerations, contusions, ecchymoses, or hematomas of the scalp or face.Neck:  No bony cervical spine tenderness or stepoffs. No abrasions, lacerations, contusions, ecchymoses, or hematomas of the neck. Trachea midline. Palpable carotid pulses bilaterallyCardiac:  Regular rate and rhythm.Pulmonary:  Bilateral breath sounds. No respiratory distress.Chest:  CW non-TTP. No flail segments. No gross chest wall deformities. No abrasions, lacerations, or ecchymoses.Abdomen:  Soft, nontender, nondistended. Without rebound or guarding. No abrasions, lacerations, contusions, ecchymoses, or hematomas.Pelvis:  Stable and non-tender to AP and lateral compression. No blood at urethral meatus. Extremities:  Moves extremities spontaneously; warm & well-perfused, no edema, normal sensation and motor function of b/l upper extremities and also b/l lower extremities. Palpable right radial and brachial pulses, normal movement, sensation and motor  function of right hand and right arm.  Moderate ecchymosis of right upper arm, mild swelling, no hematoma, and no underlying fluctuance.  Minimally tender.Rectal Tone: Examination deferred.Gross rectal blood: Examination deferred.Skin:  Skin intact, warm with brisk capillary refill and normal skin turgor.  Except as noted above, no lacerationsCBC:Lab Results Component Value Date  WBC 6.3 05/15/2023  HGB 7.8 (L) 05/15/2023  HCT 23.90 (L) 05/15/2023  PLT 177 05/15/2023 LYTES:Lab Results Component Value Date  NA 140 05/15/2023  K 4.3 05/15/2023  CL 108 (H) 05/15/2023  CO2 25 05/15/2023  CREATININE 1.02 05/15/2023  BUN 18 05/15/2023  GLU 89 05/15/2023  CALCIUM 8.8 05/15/2023  MG 1.2 (L) 03/28/2014 LFTs:No results found for: BILITOT, BILIDIR, AST, ALT, ALKPHOS, GGT, AMYLASE, LIPASEPT/INR/PTT:No results found for: INR, PTTMicrobiology:No results for input(s): LABBLOO, LABURIN, LOWERRESPIRA in the last 168 hours.Imaging:No results found. ECG/Tele Events: Results for orders placed or performed during the hospital encounter of 03/28/14 EKG Result Value Ref Range  Heart Rate 72 BPM  Heart Rate 72 BPM  P-R Interval 216 ms  QRS Duration 88 ms  Q-T Interval 374 ms  QTC Calculation(Bezet) 409 ms  P Axis 44 degrees  R Axis -25 degrees  T Axis 51 degrees  Diagnosis     Sinus rhythm with sinus arrhythmia with 1st degree A-V blockOtherwise normal ECGNo previous ECGs availableConfirmed by CASALE M.D., LINDA ([id number]) on 03/30/2014 2:47:21 PM Assessment & Plan Patient is a 82 y.o. male, with traumatic fall on 06/12 complicated by right upper arm ecchymosis/mild swelling which has been stable since the fall on 06/12.  Trauma surgery consulted for trauma evaluation.Plan:- per discussion with the radiology team at [facility], his CTA images from the [place] were reviewed and no blush or active extra seen in the right arm on CTA scan.- patient has mild ecchymosis of right upper arm with no underlying hematoma or fluctuance- No traumatic injuries on exam.- dispo per EDPlan not final until attending addendum Discussed with chief resident Dr. Jannette Fogo and attending surgeon Dr. Laural Benes. Electronically Signed By:---Jay Eulis Canner, MDGeneral Surgery Resident06/18/2411:50 PM

## 2023-06-23 ENCOUNTER — Emergency Department: Admit: 2023-06-23 | Payer: MEDICAID

## 2023-06-23 ENCOUNTER — Inpatient Hospital Stay: Admit: 2023-06-23 | Discharge: 2023-06-23 | Payer: MEDICAID | Attending: Emergency Medicine

## 2023-06-23 MED ORDER — LIDOCAINE 4 % TOPICAL PATCH
4 % | Freq: Once | TRANSDERMAL | Status: DC
Start: 2023-06-23 — End: 2023-06-24

## 2023-06-23 MED ORDER — ACETAMINOPHEN 325 MG TABLET
325 mg | Freq: Once | ORAL | Status: CP
Start: 2023-06-23 — End: ?
  Administered 2023-06-24: 01:00:00 325 mg via ORAL

## 2023-06-24 NOTE — Discharge Instructions
For pain, you may take acetaminophen 975 MG (3 tablets of regular strength or 2 tablets of extra strength) every 8 hours as needed.Please schedule an appointment to see your primary care doctor and return to the Emergency Room with any new or worsening symptoms.

## 2023-06-24 NOTE — ED Notes
8:14 PM Pt with complaint of fall. States  I was coming out my bathroom, felt dizzy, and fell on the back of my shoulder;  might be because I did not have the air on, turned air on felt better fell asleep, woke up with pain in the left shoulder was unable to move it. Denies head strike, LOC, headaches, thinner and or dizziness at this time. A&OX4, respirations even and unlabored, speak in full clear sentences. 8:45 PMMedicated as documented in MAR9:09 PMPatient provided discharge paperwork/instructions by Dr. Ave Filter  Patient verbalized understanding and denies any questions regarding care.  Patient exited ED with strong, steady gait.

## 2023-07-13 NOTE — ED Provider Notes
Chief Complaint Patient presents with  Fall   Fall earlier today taking garbage out, got dizzy, no headstrike, no thinners, no LOC. Landed on L shoulder/scapula and c/o pain to L upper back and arm. HPI/PE:82 y/o M here with L scapula pain. Pt was coming out of the bathroom, started getting lightheaded, fell and struck his back.On exam he is afebrile, HD stable, well-appearing. +tenderness over L scapula. No overlying skin changes, ROM L shoulder limited by pain, no obvious deformity. Plan for XR, analgesia._________XR with no acute injury, stable for d/c. A decision regarding hospitalization was made during this visit  Patient does not require admission or further ED Observation at this time    Physical ExamED Triage Vitals [06/23/23 1955]BP: 115/67Pulse: 68Pulse from  O2 sat: n/aResp: 17Temp: 98.1 ?F (36.7 ?C)Temp src: OralSpO2: 98 % BP 115/67  - Pulse 68  - Temp 98.1 ?F (36.7 ?C) (Oral)  - Resp 17  - Wt 61.5 kg (135 lb 9.3 oz)  - SpO2 98% Physical Exam ProceduresAttestation/Critical CareClinical Impressions as of 07/13/23 1311 Pain of left scapula  ED DispositionDischarge Charlann Boxer, MD08/16/24 1311

## 2023-12-15 ENCOUNTER — Emergency Department: Admit: 2023-12-15 | Payer: PRIVATE HEALTH INSURANCE

## 2023-12-15 ENCOUNTER — Inpatient Hospital Stay
Admit: 2023-12-15 | Discharge: 2023-12-20 | Payer: PRIVATE HEALTH INSURANCE | Admitting: Student in an Organized Health Care Education/Training Program

## 2023-12-15 LAB — SARS-COV-2 (COVID-19)/INFLUENZA A+B/RSV BY RT-PCR (BH GH LMW YH)
BKR INFLUENZA A: NEGATIVE
BKR INFLUENZA B: NEGATIVE
BKR RESPIRATORY SYNCYTIAL VIRUS: NEGATIVE
BKR SARS-COV-2 RNA (COVID-19) (YH): POSITIVE — AB

## 2023-12-15 LAB — CBC WITH AUTO DIFFERENTIAL
BKR WAM ABSOLUTE IMMATURE GRANULOCYTES.: 0.01 x 1000/ÂµL (ref 0.00–0.30)
BKR WAM ABSOLUTE LYMPHOCYTE COUNT.: 1.17 x 1000/ÂµL (ref 0.60–3.70)
BKR WAM ABSOLUTE NRBC (2 DEC): 0 x 1000/ÂµL (ref 0.00–1.00)
BKR WAM ANC (ABSOLUTE NEUTROPHIL COUNT): 4.25 x 1000/ÂµL (ref 2.00–7.60)
BKR WAM BASOPHIL ABSOLUTE COUNT.: 0.02 x 1000/ÂµL (ref 0.00–1.00)
BKR WAM BASOPHILS: 0.3 % (ref 0.0–1.4)
BKR WAM EOSINOPHIL ABSOLUTE COUNT.: 0 x 1000/ÂµL (ref 0.00–1.00)
BKR WAM EOSINOPHILS: 0 % (ref 0.0–5.0)
BKR WAM HEMATOCRIT (2 DEC): 37.1 % — ABNORMAL LOW (ref 38.50–50.00)
BKR WAM HEMOGLOBIN: 11.7 g/dL — ABNORMAL LOW (ref 13.2–17.1)
BKR WAM IMMATURE GRANULOCYTES: 0.2 % (ref 0.0–1.0)
BKR WAM LYMPHOCYTES: 19.7 % (ref 17.0–50.0)
BKR WAM MCH (PG): 30.5 pg (ref 27.0–33.0)
BKR WAM MCHC: 31.5 g/dL (ref 31.0–36.0)
BKR WAM MCV: 96.9 fL (ref 80.0–100.0)
BKR WAM MONOCYTE ABSOLUTE COUNT.: 0.48 x 1000/ÂµL (ref 0.00–1.00)
BKR WAM MONOCYTES: 8.1 % (ref 4.0–12.0)
BKR WAM MPV: 9.6 fL (ref 8.0–12.0)
BKR WAM NEUTROPHILS: 71.7 % (ref 39.0–72.0)
BKR WAM NUCLEATED RED BLOOD CELLS: 0 % (ref 0.0–1.0)
BKR WAM PLATELETS: 383 x1000/ÂµL (ref 150–420)
BKR WAM RDW-CV: 15.5 % — ABNORMAL HIGH (ref 11.0–15.0)
BKR WAM RED BLOOD CELL COUNT.: 3.83 M/ÂµL — ABNORMAL LOW (ref 4.00–6.00)
BKR WAM WHITE BLOOD CELL COUNT: 5.9 x1000/ÂµL (ref 4.0–11.0)

## 2023-12-15 LAB — MAGNESIUM: BKR MAGNESIUM: 2.1 mg/dL (ref 1.7–2.4)

## 2023-12-15 LAB — PROTIME AND INR
BKR INR: 1.18 — ABNORMAL HIGH (ref 0.90–1.16)
BKR PROTHROMBIN TIME: 12.3 s — ABNORMAL HIGH (ref 9.5–12.1)

## 2023-12-15 LAB — COMPREHENSIVE METABOLIC PANEL
BKR A/G RATIO: 0.7 — ABNORMAL LOW (ref 1.0–2.2)
BKR ALANINE AMINOTRANSFERASE (ALT): 13 U/L (ref 9–59)
BKR ALBUMIN: 3 g/dL — ABNORMAL LOW (ref 3.6–5.1)
BKR ALKALINE PHOSPHATASE: 113 U/L (ref 9–122)
BKR ANION GAP: 5 — ABNORMAL LOW (ref 7–17)
BKR ASPARTATE AMINOTRANSFERASE (AST): 20 U/L (ref 10–35)
BKR AST/ALT RATIO: 1.5
BKR BILIRUBIN TOTAL: 0.2 mg/dL (ref <=1.2)
BKR BLOOD UREA NITROGEN: 31 mg/dL — ABNORMAL HIGH (ref 8–23)
BKR BUN / CREAT RATIO: 19.9 (ref 8.0–23.0)
BKR CALCIUM: 9.3 mg/dL (ref 8.8–10.2)
BKR CHLORIDE: 107 mmol/L (ref 98–107)
BKR CO2: 29 mmol/L (ref 20–30)
BKR CREATININE DELTA: 0.54 — ABNORMAL HIGH
BKR CREATININE: 1.56 mg/dL — ABNORMAL HIGH (ref 0.40–1.30)
BKR EGFR, CREATININE (CKD-EPI 2021): 44 mL/min/{1.73_m2} — ABNORMAL LOW (ref >=60)
BKR GLOBULIN: 4.4 g/dL — ABNORMAL HIGH (ref 2.0–3.9)
BKR GLUCOSE: 102 mg/dL — ABNORMAL HIGH (ref 70–100)
BKR POTASSIUM: 4.8 mmol/L (ref 3.3–5.3)
BKR PROTEIN TOTAL: 7.4 g/dL (ref 5.9–8.3)
BKR SODIUM: 141 mmol/L (ref 136–144)

## 2023-12-15 LAB — NT-PROBNPE: BKR B-TYPE NATRIURETIC PEPTIDE, PRO (PROBNP): 3988 pg/mL — ABNORMAL HIGH (ref <450.0)

## 2023-12-15 MED ORDER — SODIUM CHLORIDE 0.9 % BOLUS (NEW BAG)
0.9 % | Freq: Once | INTRAVENOUS | Status: DC
Start: 2023-12-15 — End: 2023-12-15

## 2023-12-15 MED ORDER — LACTATED RINGERS IV BOLUS (NEW BAG)
Freq: Once | INTRAVENOUS | Status: CP
Start: 2023-12-15 — End: ?
  Administered 2023-12-16: 1000.000 mL/h via INTRAVENOUS

## 2023-12-15 MED ORDER — DEXTROSE 10 % IV BOLUS FOR ORDERABLE
INTRAVENOUS | Status: AC | PRN
Start: 2023-12-15 — End: 2023-12-21

## 2023-12-15 MED ORDER — ETHYL ALCOHOL 62 % TOPICAL SWAB
62 % | Freq: Two times a day (BID) | NASAL | Status: AC
Start: 2023-12-15 — End: 2023-12-21
  Administered 2023-12-19 – 2023-12-20 (×3): 62 % via NASAL

## 2023-12-15 MED ORDER — ASPIRIN 81 MG TABLET,DELAYED RELEASE
81 mg | Freq: Every day | ORAL | Status: SS
Start: 2023-12-15 — End: ?

## 2023-12-15 MED ORDER — FRUIT JUICE
ORAL | Status: AC | PRN
Start: 2023-12-15 — End: 2023-12-21

## 2023-12-15 MED ORDER — POLYETHYLENE GLYCOL 3350 17 GRAM ORAL POWDER PACKET
17 gram | Freq: Every day | ORAL | Status: CP
Start: 2023-12-15 — End: 2023-12-21

## 2023-12-15 MED ORDER — DEXTROSE 40 % ORAL GEL
40 % | ORAL | Status: AC | PRN
Start: 2023-12-15 — End: 2023-12-21

## 2023-12-15 MED ORDER — GLUCAGON 1 MG/ML IN STERILE WATER
Freq: Once | INTRAMUSCULAR | Status: AC | PRN
Start: 2023-12-15 — End: 2023-12-21

## 2023-12-15 MED ORDER — SODIUM CHLORIDE 0.9 % (FLUSH) INJECTION SYRINGE
0.9 % | Freq: Three times a day (TID) | INTRAVENOUS | Status: AC
Start: 2023-12-15 — End: 2023-12-21
  Administered 2023-12-16 – 2023-12-20 (×7): 0.9 mL via INTRAVENOUS

## 2023-12-15 MED ORDER — ENOXAPARIN 30 MG/0.3 ML SUBCUTANEOUS SYRINGE
30 | SUBCUTANEOUS | Status: DC
Start: 2023-12-15 — End: 2023-12-16
  Administered 2023-12-16: 14:00:00 30 mg/0.3 mL via SUBCUTANEOUS

## 2023-12-15 MED ORDER — INSULIN LISPRO 100 UNIT/ML (CORRECTION SCALE)
100 unit/mL | Freq: Every evening | SUBCUTANEOUS | Status: DC
Start: 2023-12-15 — End: 2023-12-16

## 2023-12-15 MED ORDER — FAMOTIDINE 20 MG TABLET
20 mg | Freq: Every day | ORAL | Status: CP
Start: 2023-12-15 — End: 2023-12-21
  Administered 2023-12-16 – 2023-12-20 (×4): 20 mg via ORAL

## 2023-12-15 MED ORDER — ENOXAPARIN 30 MG/0.3 ML SUBCUTANEOUS SYRINGE
300.3 mg/0.3 mL | Freq: Two times a day (BID) | SUBCUTANEOUS | Status: DC
Start: 2023-12-15 — End: 2023-12-15

## 2023-12-15 MED ORDER — SODIUM CHLORIDE 0.9 % (FLUSH) INJECTION SYRINGE
0.9 % | INTRAVENOUS | Status: AC | PRN
Start: 2023-12-15 — End: 2023-12-21

## 2023-12-15 MED ORDER — INSULIN LISPRO 100 UNIT/ML (CORRECTION SCALE)
100 unit/mL | Freq: Three times a day (TID) | SUBCUTANEOUS | Status: DC
Start: 2023-12-15 — End: 2023-12-16

## 2023-12-15 MED ORDER — ATORVASTATIN 80 MG TABLET
80 mg | Freq: Every evening | ORAL | Status: SS
Start: 2023-12-15 — End: ?

## 2023-12-15 MED ORDER — SKIM MILK
ORAL | Status: AC | PRN
Start: 2023-12-15 — End: 2023-12-21

## 2023-12-15 NOTE — H&P
 MICU Teaching Attending Attestation: I have seen and examined Mr. Holway with resident/fellow/PA and agree with their assessment and plan as outlined with the following additions and modifications#A-flutter with sick sinus? #Hypotension #DM#COVID19 s/p molnupiravir#prostate/gastric/colonic cancer hx#Tobacco usePlan- delirium precautions- avoid sedating meds- continuous tele - pacer pads on- cards consult- avoid BB, CCB or other AV nodal blockers - formal echo- might require TVP vs PPM? - consider additional 1L LR- MAP goal >65- Levo as 1st line pressor - keep Mg >2, K >4- defer COVID therapies for now, s/p 5 days of Molnupiravir. On RA therefore no indication for dexamethasone/Remdesivir - diabetic diet- bowel regimen- monitor off abx- monitor UOP, lytes, Cr- Hep SQ- PT/OT- POC glucose Q6h- sliding scale Critical Care Attestation: This patient is critically ill as indicated by the following:  SSS with A-flutter .The patient has required 40 minutes of my undivided attention during the past 24 hours for one of the previously noted life threatening diagnoses. This time is exclusive of the time devoted to procedures.Critical Care interventions have included: ongoing metabolic management and family/patient discussion involving critical decision making and/or counseling.Glean Salen, MDZubin Hajra Port, MDPulmonary, Critical Care and Sleep Medicine [facility] [facility]

## 2023-12-15 NOTE — ED Provider Notes
 Chief Complaint Patient presents with  Weakness   Starting approx 1 week ago when he was dx with Covid  Cough   +congested cough, bringing up pale colored mucous. Denies fever. Decreased PO intake. Denies N/V/D. Emergency Medicine Resident Note Presentation: Charles [name] is a 83 y.o. male with PMHx of prostate/gastric/colonic cancer, tobacco use disorder, who presents today for a productive cough and generalized weakness. Patient explained I week ago tested positive for COVID.  Since then he has developed a productive cough with pale colored mucus as well as generalized weakness.  He also endorses lightheadedness whenever he stands up from a sitting or laying flat position.  He says that he has been attempting to eat and drink but he has had decreased appetite since being diagnosed with COVID.  He denies any chest pain, shortness of breath, loss of consciousness, abdominal pain, nausea, vomiting, fevers, diarrhea.Pertinent Exam Findings: Physical ExamVitals and nursing note reviewed. Constitutional:     General: He is not in acute distress.   Appearance: Normal appearance. He is normal weight. He is not ill-appearing, toxic-appearing or diaphoretic. Cardiovascular:    Rate and Rhythm: Normal rate and regular rhythm.    Pulses: Normal pulses.    Heart sounds: Normal heart sounds. No murmur heard.   No friction rub. No gallop. Pulmonary:    Effort: Pulmonary effort is normal. No respiratory distress.    Breath sounds: Normal breath sounds. No stridor. No wheezing, rhonchi or rales. Abdominal:    General: Abdomen is flat.    Palpations: Abdomen is soft. Skin:   General: Skin is warm and dry.    Coloration: Skin is not pale. Neurological:    General: No focal deficit present.    Mental Status: He is alert and oriented to person, place, and time. Mental status is at baseline. Psychiatric:       Mood and Affect: Mood normal.       Behavior: Behavior normal.       Thought Content: Thought content normal.  Assessment/plan: Charles [name] is a 83 y.o. male with PMHx of prostate/gastric/colonic cancer, tobacco use disorder, who presents today for a productive cough and generalized weakness. A based on the patient's history and physical exam it appears he may have some sort of pneumonia superimposed on top of his COVID-19 infection.  We will plan on ordering a chest x-ray to evaluate this further.  Given that he is having generalized weakness and lightheadedness, we will also order basic lab work to evaluate for any sort of anemia or electrolyte derangements.  Given the fact that he has been having decreased p.o. intake it appears that he may be dehydrated which is further contributing to his symptoms.  However we will plan to pursue a 12 lead EKG to evaluate for any sort of ischemia or arrhythmia contributing to his symptoms.  Based on the patient's symptoms primarily as lightheadedness and generalized weakness the patient would benefit from admission as he reports that he is having a difficult time getting around with the symptoms.ED Course:Comments as of 12/15/23 1744 Sat Dec 15, 2023 1619 CXR shows signs of pulmonary edema. Lab work notable for an AKI. Will plan to admit the patient to medicine at this time.  [MA] 1631 Patient admitted to medicine at this time. [MA] 1655 Cards consult placed for possible new atrial flutter. [MA] 1743 Patient BNP elevated, soft blood pressures in the setting of what appears to be a new Aflutter. Will plan to consult MICU. [MA] 1744 Admitted to  MICU at this time for sinus node dysfunction. [MA]  Comments User Index[MA] Michae Kava, DO   Clinical Impressions as of 12/15/23 1744 Acute pulmonary edema (HC Code) (HC CODE) (HC Code) AKI (acute kidney injury) (HC Code) This patient was presented to and discussed with the attending and a treatment plan and disposition were collaboratively agreed upon.Michae Kava, D.O.Emergency Medicine, ResidentThis note may have been produced using M-Modal transcription software: please excuse any typos.----------------------------------------------------------------------------------------------------------------------An acute or life threatening problem was considered during this evaluation  External data reviewed: Labs and Notes (OSH or non-ED)Care limited by SDOH: Ambulatory Care Access.  Physical ExamED Triage Vitals [12/15/23 1254]BP: 90/61Pulse: 70Pulse from  O2 sat: n/aResp: (!) 24Temp: 97 ?F (36.1 ?C)Temp src: TemporalSpO2: 95 % BP (!) 92/54  - Pulse (!) 56  - Temp 98.1 ?F (36.7 ?C) (Oral)  - Resp 17  - Ht 6' 1 (1.854 m)  - Wt 59.6 kg (131 lb 6.3 oz)  - SpO2 99%  - BMI 17.34 kg/m? ProceduresAttestation/Critical CarePatient Reevaluation: Charles [name] good friend [phone] would like to be contacted.83 year old male who presented to the ED today with weakness and cough.  Patient found to be COVID positive.  Patient also in atrial flutter wave which patient does not have any documentation that he has has a flutter.  Patient goes mainly to the [place] for care.Was informed by the nurse with the patient's blood pressure had dropped significantly as well as his heart rate.  Patient was seen and evaluated by ICU and felt that he would need to be taken to the ICU given the fact that he had a pause and appeared to be in VFib on our monitor.  Will send patient to the ICU.  Will need close monitor and telemetry.Comments as of 12/15/23 1744 Sat Dec 15, 2023 1619 CXR shows signs of pulmonary edema. Lab work notable for an AKI. Will plan to admit the patient to medicine at this time.  [MA] 1631 Patient admitted to medicine at this time. [MA] 1655 Cards consult placed for possible new atrial flutter. [MA] 1743 Patient BNP elevated, soft blood pressures in the setting of what appears to be a new Aflutter. Will plan to consult MICU. [MA] 1744 Admitted to MICU at this time for sinus node dysfunction. [MA]  Comments User Index[MA] Michae Kava, DO   Clinical Impressions as of 12/15/23 1744 Acute pulmonary edema (HC Code) (HC CODE) (HC Code) AKI (acute kidney injury) [facility] Code)  ED DispositionAdmit  Herb Grays, DO01/18/25 1744

## 2023-12-15 NOTE — Utilization Review (ED)
 Utilization Review from XsolisPatient Data:  Patient Name: Charles [name] Age: 83 y.o. DOB: 1941/11/21	 MRN: [id number]	 Indicated Status: Inpatient Review Comments: covid

## 2023-12-15 NOTE — ED Notes
 2:47 PM PIV established, blood work drawn and COVID/flu swab obtained; all sent to lab. EKG completed.

## 2023-12-15 NOTE — ED Triage Note
 ED Triage NoteI have initiated a medical screening exam for this patient as the physician in triage. If appropriate, I have placed initial triage orders. This is a MSE only, limited nature of this exam was explained to the patient and/or caregiver who expressed understanding and need for further evaluation today in the emergency department. Care is to be completed/continued by EM provider Yvone Neu, DO12:57 PM

## 2023-12-16 LAB — CBC WITHOUT DIFFERENTIAL
BKR WAM ANC (ABSOLUTE NEUTROPHIL COUNT): 4.55 x 1000/ÂµL (ref 2.00–7.60)
BKR WAM HEMATOCRIT (2 DEC): 33 % — ABNORMAL LOW (ref 38.50–50.00)
BKR WAM HEMOGLOBIN: 10.8 g/dL — ABNORMAL LOW (ref 13.2–17.1)
BKR WAM MCH (PG): 31.6 pg (ref 27.0–33.0)
BKR WAM MCHC: 32.7 g/dL (ref 31.0–36.0)
BKR WAM MCV: 96.5 fL (ref 80.0–100.0)
BKR WAM MPV: 9.6 fL (ref 8.0–12.0)
BKR WAM PLATELETS: 328 x1000/ÂµL (ref 150–420)
BKR WAM RDW-CV: 15.3 % — ABNORMAL HIGH (ref 11.0–15.0)
BKR WAM RED BLOOD CELL COUNT.: 3.42 M/ÂµL — ABNORMAL LOW (ref 4.00–6.00)
BKR WAM WHITE BLOOD CELL COUNT: 6.7 x1000/ÂµL (ref 4.0–11.0)

## 2023-12-16 LAB — BASIC METABOLIC PANEL
BKR ANION GAP: 6 — ABNORMAL LOW (ref 7–17)
BKR BLOOD UREA NITROGEN: 26 mg/dL — ABNORMAL HIGH (ref 8–23)
BKR BUN / CREAT RATIO: 21.5 (ref 8.0–23.0)
BKR CALCIUM: 8.9 mg/dL (ref 8.8–10.2)
BKR CHLORIDE: 108 mmol/L — ABNORMAL HIGH (ref 98–107)
BKR CO2: 26 mmol/L (ref 20–30)
BKR CREATININE DELTA: -0.35
BKR CREATININE: 1.21 mg/dL (ref 0.40–1.30)
BKR EGFR, CREATININE (CKD-EPI 2021): 60 mL/min/{1.73_m2} (ref >=60)
BKR GLUCOSE: 85 mg/dL (ref 70–100)
BKR POTASSIUM: 5 mmol/L (ref 3.3–5.3)
BKR SODIUM: 140 mmol/L (ref 136–144)

## 2023-12-16 LAB — CBC WITH AUTO DIFFERENTIAL
BKR WAM ABSOLUTE IMMATURE GRANULOCYTES.: 0.02 x 1000/ÂµL (ref 0.00–0.30)
BKR WAM ABSOLUTE LYMPHOCYTE COUNT.: 1.86 x 1000/ÂµL (ref 0.60–3.70)
BKR WAM ABSOLUTE NRBC (2 DEC): 0 x 1000/ÂµL (ref 0.00–1.00)
BKR WAM ANC (ABSOLUTE NEUTROPHIL COUNT): 3.67 x 1000/ÂµL (ref 2.00–7.60)
BKR WAM BASOPHIL ABSOLUTE COUNT.: 0.01 x 1000/ÂµL (ref 0.00–1.00)
BKR WAM BASOPHILS: 0.2 % (ref 0.0–1.4)
BKR WAM EOSINOPHIL ABSOLUTE COUNT.: 0 x 1000/ÂµL (ref 0.00–1.00)
BKR WAM EOSINOPHILS: 0 % (ref 0.0–5.0)
BKR WAM HEMATOCRIT (2 DEC): 32.9 % — ABNORMAL LOW (ref 38.50–50.00)
BKR WAM HEMOGLOBIN: 10.8 g/dL — ABNORMAL LOW (ref 13.2–17.1)
BKR WAM IMMATURE GRANULOCYTES: 0.3 % (ref 0.0–1.0)
BKR WAM LYMPHOCYTES: 29.5 % (ref 17.0–50.0)
BKR WAM MCH (PG): 31.3 pg (ref 27.0–33.0)
BKR WAM MCHC: 32.8 g/dL (ref 31.0–36.0)
BKR WAM MCV: 95.4 fL (ref 80.0–100.0)
BKR WAM MONOCYTE ABSOLUTE COUNT.: 0.75 x 1000/ÂµL (ref 0.00–1.00)
BKR WAM MONOCYTES: 11.9 % (ref 4.0–12.0)
BKR WAM MPV: 10 fL (ref 8.0–12.0)
BKR WAM NEUTROPHILS: 58.1 % (ref 39.0–72.0)
BKR WAM NUCLEATED RED BLOOD CELLS: 0 % (ref 0.0–1.0)
BKR WAM PLATELETS: 338 x1000/ÂµL (ref 150–420)
BKR WAM RDW-CV: 15.4 % — ABNORMAL HIGH (ref 11.0–15.0)
BKR WAM RED BLOOD CELL COUNT.: 3.45 M/ÂµL — ABNORMAL LOW (ref 4.00–6.00)
BKR WAM WHITE BLOOD CELL COUNT: 6.3 x1000/ÂµL (ref 4.0–11.0)

## 2023-12-16 LAB — MAGNESIUM: BKR MAGNESIUM: 1.9 mg/dL (ref 1.7–2.4)

## 2023-12-16 LAB — TSH W/REFLEX TO FT4     (BH GH LMW Q YH): BKR THYROID STIMULATING HORMONE: 1.3 u[IU]/mL

## 2023-12-16 LAB — PARTIAL THROMBOPLASTIN TIME     (BH GH LMW Q YH): BKR PARTIAL THROMBOPLASTIN TIME: 41.5 s — ABNORMAL HIGH (ref 23.0–31.0)

## 2023-12-16 MED ORDER — HEPARIN BOLUS FROM BAG (YNH INTERMITTENT 2)
Freq: Four times a day (QID) | INTRAVENOUS | Status: AC | PRN
Start: 2023-12-16 — End: 2023-12-21

## 2023-12-16 MED ORDER — INSULIN LISPRO 100 UNIT/ML (CORRECTION SCALE)
100 unit/mL | Freq: Before meals | SUBCUTANEOUS | Status: CP
Start: 2023-12-16 — End: 2023-12-21
  Administered 2023-12-18: 18:00:00 100 unit/mL via SUBCUTANEOUS

## 2023-12-16 MED ORDER — HEPARIN (PORCINE) 5,000 UNIT/ML INJECTION SOLUTION
5000 | Freq: Two times a day (BID) | SUBCUTANEOUS | Status: DC
Start: 2023-12-16 — End: 2023-12-16

## 2023-12-16 MED ORDER — MAGNESIUM OXIDE 400 MG (241.3 MG MAGNESIUM) TABLET
400 | ORAL | Status: CP
Start: 2023-12-16 — End: ?
  Administered 2023-12-16 (×2): 400 mg (241.3 mg magnesium) via ORAL

## 2023-12-16 MED ORDER — HEPARIN BOLUS FROM BAG (ACS/AF INITIAL BOLUS)
Freq: Once | INTRAVENOUS | Status: CP
Start: 2023-12-16 — End: ?
  Administered 2023-12-16: 17:00:00 36.000 mL via INTRAVENOUS

## 2023-12-16 MED ORDER — HEPARIN BOLUS FOR BAG (YNH INTERMITTENT)
Freq: Four times a day (QID) | INTRAVENOUS | Status: AC | PRN
Start: 2023-12-16 — End: 2023-12-21

## 2023-12-16 MED ORDER — TAMSULOSIN 0.4 MG CAPSULE
0.4 mg | Freq: Every evening | ORAL | Status: CP
Start: 2023-12-16 — End: 2023-12-21
  Administered 2023-12-17 – 2023-12-19 (×4): 0.4 mg via ORAL

## 2023-12-16 MED ORDER — HEPARIN 25,000 UNIT/250 ML D5W (ACS/AFIB ALL SITES)
25000 unit/250 mL | INTRAVENOUS | Status: CP
Start: 2023-12-16 — End: 2023-12-20
  Administered 2023-12-16 – 2023-12-17 (×2): 25000 mL/h via INTRAVENOUS

## 2023-12-16 NOTE — Other
 [facility] [address] [place], [place] [address]BJYNW: [phone]: [phone] [place]:[address], [place] [address]WNUUV: [phone]: [phone] [place]:[address], [place] [address]JJOAC: [phone]: [phone] Name:Charles [name] [id number]/male/82 y.o. DOB:03-Sep-1942Adm:12/15/2023  1:00 PM day1 Consult Information: Consultation requested by: Glean Salen, MDReason for consultation: Atrial flutterPrimary Doctor: [facility], [facility]Source of Information: Patient and EHRPresentation History: This is an 83 yo male with a h/o DM, tobacco smoking and gastric ca S/P gastrectomy. One-week ago he tested positive for COVID, since then he has been having productive cough, poor po intake, weakness and lightheadedness when he stands up from sitting or laying flat position. He was hypotensive in the ED  with brief unresponsiveness. He tested positive for COVID. Chest x-ray showed bilateral pleural effusions with overlying opacities and he was admitted to the MICU. Pauses up to 6 seconds noted on telemetry.He is lying in bed in the ICU, awake and alert.  He feels well without any dizziness, palpitations, chest pain or shortness breath.Past Medical History/Surgical/Family/Social History/Allergies/Medications: Past Medical History Past Surgical History Past Medical History: Diagnosis Date  Cancer (HC Code) 12/2013  They took all of my stomach out  Enlarged prostate   Melanoma (HC Code)   Stomach cancer (HC Code)   Past Surgical History: Procedure Laterality Date  ABDOMINAL SURGERY    GASTRECTOMY    INGUINAL HERNIA REPAIR Bilateral   Social History Family History Social History Tobacco Use  Smoking status: Former  Smokeless tobacco: Not on file Substance Use Topics  Alcohol use: Yes   Comment: rarely Married No family history on file. Allergies Allergies Allergen Reactions  Aspirin Other (See Comments)   Patient states he does not have this alergy  Prior to Admission Medications Scheduled Meds:Current Facility-Administered Medications Medication Dose Route Frequency Provider Last Rate Last Admin  enoxaparin (LOVENOX) injection 30 mg  30 mg Subcutaneous Daily Bham, Zubin, MD   30 mg at 12/16/23 0847  ethyl alcohol 62 % nasal swab 1 Application  1 Application Nasal Q12H Kerby Moors, Devra Dopp, MD      famotidine (PEPCID) tablet 20 mg  20 mg Oral Daily Kerby Moors, Devra Dopp, MD   20 mg at 12/16/23 0850  insulin lispro (Admelog, HumaLOG) Correction Scale 1-18 Units  1-18 Units Subcutaneous TID [facility] Kerby Moors, Devra Dopp, MD      insulin lispro (Admelog, HumaLOG) Correction Scale 1-18 Units  1-18 Units Subcutaneous Nightly Kerby Moors, Devra Dopp, MD      magnesium oxide (MAG-OX) tablet 800 mg  800 mg Oral every 2 hours Bernita Raisin, MD   800 mg at 12/16/23 0849  polyethylene glycol (MIRALAX) packet 17 g  17 g Oral Daily Kerby Moors, Devra Dopp, MD      sodium chloride 0.9 % flush 3 mL  3 mL IV Push Q8H Kerby Moors, Devra Dopp, MD   3 mL at 12/16/23 0609 Continuous Infusions:PRN Meds:.dextrose (GLUCOSE) 40 % gel 15 g **OR** fruit juice **OR** skim milk, dextrose (GLUCOSE) 40 % gel 30 g **OR** fruit juice, dextrose injection, dextrose injection, glucagon, sodium chlorideCurrent Discharge Medication List  CONTINUE these medications which have NOT CHANGED  Details aspirin 81 mg EC delayed release tablet Take 1 tablet (81 mg total) by mouth daily.  atorvastatin (LIPITOR) 80 mg tablet Take 0.5 tablets (40 mg total) by mouth nightly.  METFORMIN HCL (METFORMIN ORAL) Take by mouth.  TAMSULOSIN HCL (TAMSULOSIN ORAL) Take by mouth.    Current Medications Current IV and PRN Current Facility-Administered Medications Medication Dose Route Frequency Provider Last Rate Last Admin  enoxaparin (LOVENOX) injection 30 mg  30 mg Subcutaneous Daily Bham, Zubin, MD   30 mg at 12/16/23 0847  ethyl alcohol 62 % nasal swab 1 Application  1 Application Nasal Q12H Kerby Moors, Devra Dopp, MD      famotidine (PEPCID) tablet 20 mg  20 mg Oral Daily Kerby Moors, Devra Dopp, MD   20 mg at 12/16/23 0850  insulin lispro (Admelog, HumaLOG) Correction Scale 1-18 Units  1-18 Units Subcutaneous TID [facility] Kerby Moors, Devra Dopp, MD      insulin lispro (Admelog, HumaLOG) Correction Scale 1-18 Units  1-18 Units Subcutaneous Nightly Kerby Moors, Devra Dopp, MD      magnesium oxide (MAG-OX) tablet 800 mg  800 mg Oral every 2 hours Bernita Raisin, MD   800 mg at 12/16/23 0849  polyethylene glycol (MIRALAX) packet 17 g  17 g Oral Daily Kerby Moors, Devra Dopp, MD      sodium chloride 0.9 % flush 3 mL  3 mL IV Push Q8H Kerby Moors, Devra Dopp, MD   3 mL at 12/16/23 0609  Current Facility-Administered Medications Medication Dose Route Frequency  dextrose (GLUCOSE) 40 % gel 15 g  15 g Oral Q15 MIN PRN  Or  fruit juice 120 mL  120 mL Oral Q15 MIN PRN  Or  skim milk 240 mL  240 mL Oral Q15 MIN PRN  dextrose (GLUCOSE) 40 % gel 30 g  30 g Oral Q15 MIN PRN  Or  fruit juice 240 mL  240 mL Oral Q15 MIN PRN  dextrose 10% injection 125 mL  12.5 g Intravenous Q15 MIN PRN  dextrose 10% injection 250 mL  25 g Intravenous Q15 MIN PRN  enoxaparin (LOVENOX) injection 30 mg  30 mg Subcutaneous Daily  ethyl alcohol 62 % nasal swab 1 Application  1 Application Nasal Q12H  famotidine (PEPCID) tablet 20 mg  20 mg Oral Daily  glucagon 1 mg in water for injection, sterile 1 mL (1 mg/mL)  1 mg Intramuscular Once PRN  insulin lispro (Admelog, HumaLOG) Correction Scale 1-18 Units  1-18 Units Subcutaneous TID AC  insulin lispro (Admelog, HumaLOG) Correction Scale 1-18 Units  1-18 Units Subcutaneous Nightly  magnesium oxide (MAG-OX) tablet 800 mg  800 mg Oral every 2 hours  polyethylene glycol (MIRALAX) packet 17 g  17 g Oral Daily  sodium chloride 0.9 % flush 3 mL  3 mL IV Push Q8H  sodium chloride 0.9 % flush 3 mL  3 mL IV Push PRN for Line Care  Review of Systems: As per HPIObjective: Vitals:BP 119/67  - Pulse (!) 49  - Temp 97.4 ?F (36.3 ?C) (Axillary)  - Resp 14  - Ht 6' 1 (1.854 m)  - Wt 59.6 kg  - SpO2 98%  - BMI 17.34 kg/m? Intake/Output:Intake/Output Summary (Last 24 hours) at 12/16/2023 0909Last data filed at 12/16/2023 0400Gross per 24 hour Intake -- Output 200 ml Net -200 ml Physical Exam: General:  NAD, pleasant and alert.  ThinEyes: anicteric, no xanthelasmaNeck:  no thyromegaly, no JVD, no carotid bruits RESP:  Clear bilaterally with no wheezes, rales or rhonchiCARD:  Regular bradycardic S1 S2 with no murmurs, rubs or gallopsGI:  Soft NT/ND, +BS, no organomegaly, no bruitsMusculoskeletal: No joint swelling, no joint or muscle tendernessExtremities:  No edema, distal pulses + bilaterallyNeuro:  Alert and oriented, no gross motor deficits Skin: warm, dry with no rashesReview of Labs/Diagnostics: CBC past week: Recent Labs Lab 01/18/251435 01/19/250609 WBC 5.9 6.3 HGB 11.7* 10.8* PLT 383 338 MCV 96.9 95.4 BMP past week:  Recent Labs Lab 01/19/250609 NA 140 K 5.0 CL 108* CO2 26 ANIONGAP 6* CALCIUM 8.9 MG 1.9 GLU 85 BUN 26* CREATININE 1.21 		LFTs past week: Recent Labs Lab 01/18/251435 AST 20 ALT 13 ALKPHOS 113 ALBUMIN 3.0* PROT 7.4 INR 1.18* BILITOT 0.2 Coagulation studies of the week: Recent Labs Lab 01/18/251435 LABPROT 12.3* INR 1.18* Metabolic panel past week: Recent Labs Lab 01/19/250609 TSH 1.300 Cardiac Markers: No results for input(s): CKTOTAL, TROPTHS, HSTNT1HRDEL0, HSTNT3HRDEL0 in the last 168 hours.Recent Labs Lab 01/18/251435 [facility] 3,988.0*  ECG 12/16/23 reviewed: Aflutter with 4:1 block ASMI, lateral ST changes ?ischemia, 2015 ECG showed NSRTELE: (personally reviewed) atrial flutter with intermittent pauses up to 6 seconds  No results found for this or any previous visit.No results found for this or any previous visit. CXRResult Date: 12/15/2023 Bilateral pleural effusions with overlying opacities, likely atelectasis, although superimposed infection cannot be excluded. [facility] Notify System Classification: Routine. Report initiated by: Nila Nephew, MD Reported and signed by: Alberteen Sam, MD   Significant Co-morbidities Present on Admission: Cancer: Non-metastatic/Limited and Diabetes: Type 2 unspecified complicationsSignificant Acute Co-morbidities Developing during Hospitalization: Atrial Fibrillation: ParoxysmalImpression: PAFlutter with an intermittently slow HR and pauses up to 6 secondsCOVID infectionElevated proBNP and lung infiltrates concerning for CHFRecommendations: 1. While his atrial flutter and bradyarrhythmia could theoretically have been precipitated by his COVID infection, the presence of prolonged pauses is concerning.  I would continue to monitor him in the ICU setting and if significant pauses persist then he will need a permanent pacemaker placed.  There is no indication for a temporary pacemaker at this time.2. An echocardiogram will be checked to rule out structural heart disease including cardiomyopathy.  3. I would change Lovenox to full-dose anticoagulation given the presence of atrial flutter.4. I would hold off on diuresis for now but if he develops significant dyspnea or if his ejection fraction is significantly reduced by echo then I would start diuretics.  Thank you for the opportunity to assist in the management of  Charles [name].Signed: Barbette Hair. Nyra Jabs, MD [facility], [phone]/19/20259:09 AMElectronically Signed by Alger Memos, MD, 12/16/2023

## 2023-12-16 NOTE — Other
 Malnutrition IdentificationNutrition DiagnosisNutrition Diagnosis/Problem: Severe MalnutritionRelated to: chronic illnessAs Evidenced by: severe muscle and fat wastingClinician co-signature indicates agreement with the RD diagnosis.\Registered dietitian has identified this patient as severely malnourished based on the following criteria:Severe Malnutrition:Weight Loss:    Energy Intake:     Body Fat:  Chronic Illness: Severe Depletion - Orbital fat pads, Severe Depletion - Buccal fat pads   Muscle Mass:   Chronic Illness: Severe Depletion - [place] Region, Severe Depletion - Clavicle bone region (trapezius, supraspinatus, infraspinatus muscles), Severe Depletion - Shoulder and acromium process region (deltoid Muscle)  Fluid Accumulation:   Grip Strength:    Registered Dietitian: Alean Rinne, RD, December 16, 2023

## 2023-12-16 NOTE — ED Notes
 7:07 PM Received report from [employer]. 7:18 PM PT is AOX4, breathing adequate with NAD. PT is on cardiopulmonary monitor. PT refuses Loenox injection and reports I dont need thatmedication. I've never needed it. PT is educated on the need and use of medication. PT still refuses.

## 2023-12-16 NOTE — Progress Notes
 [facility]	MICU Progress NoteDate: 1/19/2025Hospital day: 1Attending: No att. providers foundInterim History: This is Charles [name], who is a 83 y.o. male with PMHx of diabetes, prostate/gastric/colonic cancer, tobacco use disorder , BPH.  Patient  reports  one-week ago he tested positive for COVID, since then he has been having productive cough weakness and lightheadedness when he stands up from sitting or laying flat position.  He also endorses poor oral intake.  No chest pain, falls, loss of consciousness.   On arrival to ED blood pressure 90/61 heart rate of 70 respiratory rate of 24 afebrile saturating 95% on room air.  He arrived awake and alert able to speak comfortable respiratory distress.  While in the ED blood pressure dropped to 80s over 40s blood glucose at that time was noted to be 75 he was provided a cup of applesauce afterwards he became unresponsive for a couple of seconds, per notes be AFib noted on the monitor and patient returned to a flutter after sternal rub.  Initial blood work remarkable for no electrolyte abnormalities elevated creatinine 1.5 from baseline 1.0, elevated proBNP more than 3000, hemoglobin stable around 11 and no leukocytosis noted.  He tested positive for COVID. Chest x-ray showed bilateral pleural effusions with overlying opacities. Patient was transferred to MICU for close cardiac monitoring on telemetry. Patient was given an additional litre of LR. Overnight events: - Patient continues to be in atrial flutter with slow ventricular response in 30-50's with significant pauses- No hemodynamic instability, patient denies any chest pain, SOB, palpitations, lightheadedness Data: Vitals:I have reviewed the patient's current vital signs as documented in the patient's EMR.   and Last 24 hours: Temp:  [97 ?F (36.1 ?C)-98.1 ?F (36.7 ?C)] 97.4 ?F (36.3 ?C)Pulse:  [30-75] 33Resp:  [12-24] 12BP: (78-174)/(50-81) 120/67SpO2:  [85 %-99 %] 98 %Vent Settings: N/AABG:  No results for input(s): PHART, PCO2ART, PO2ART, HCO3ART, O2SATART, LITERFLOW in the last 168 hours.I/O's:I have reviewed the patient's current I&O's as documented in the EMR.No intake/output data recorded.Gross Totals (Last 24 hours) at 12/16/2023 0738Last data filed at 12/16/2023 0400Intake -- Output 200 ml Net -200 ml Hourly urine output:	mL/hr/kgDrips: n/aIVF: n/aLines:PIVs (1/18)RASS: 0-->alert and calmDiet: Diet Cardiac Consistent Carbohydrate  Last BM: 12/15/23 DVT Prophylaxis:  enoxaparin 30 mg daily GI Prophylaxis:  famotidine 20 mg daily ECG/Tele Events: Results for orders placed or performed during the hospital encounter of 12/15/23 EKG Result Value Ref Range  Heart Rate 34 bpm  QRS Interval 70 ms  QT Interval 422 ms  QTC Interval 317 ms  P Axis 97 deg  QRS Axis 30 deg  T Wave Axis 59 deg  P-R Interval 0 msec  SEVERITY Abnormal ECG severity  Medications: Antibiotics: noneScheduled Meds:Scheduled Medication Medication Dose Route Frequency  dextrose (GLUCOSE) 40 % gel 15 g  15 g Oral Q15 MIN PRN  Or  fruit juice 120 mL  120 mL Oral Q15 MIN PRN  Or  skim milk 240 mL  240 mL Oral Q15 MIN PRN  dextrose (GLUCOSE) 40 % gel 30 g  30 g Oral Q15 MIN PRN  Or  fruit juice 240 mL  240 mL Oral Q15 MIN PRN  dextrose 10% injection 125 mL  12.5 g Intravenous Q15 MIN PRN  dextrose 10% injection 250 mL  25 g Intravenous Q15 MIN PRN  enoxaparin (LOVENOX) injection 30 mg  30 mg Subcutaneous Daily  ethyl alcohol 62 % nasal swab 1 Application  1 Application Nasal Q12H  famotidine (PEPCID) tablet 20 mg  20 mg  Oral Daily  glucagon 1 mg in water for injection, sterile 1 mL (1 mg/mL)  1 mg Intramuscular Once PRN  insulin lispro (Admelog, HumaLOG) Correction Scale 1-18 Units  1-18 Units Subcutaneous TID AC  insulin lispro (Admelog, HumaLOG) Correction Scale 1-18 Units  1-18 Units Subcutaneous Nightly  polyethylene glycol (MIRALAX) packet 17 g  17 g Oral Daily  sodium chloride 0.9 % flush 3 mL  3 mL IV Push Q8H  sodium chloride 0.9 % flush 3 mL  3 mL IV Push PRN for Line Care Continuous Infusions:PRN Meds:dextrose (GLUCOSE) 40 % gel 15 g **OR** fruit juice **OR** skim milk, dextrose (GLUCOSE) 40 % gel 30 g **OR** fruit juice, dextrose injection, dextrose injection, glucagon, sodium chlorideExam: Physical ExamGeneral: alert, no acute distressNeck: Supple, no JVD, no bruits.Pulm: Clear to auscultation bilaterally, no wheeze rales or rhonchi heard, good air entryCVS: RRR, normal S1/S2, no M/G/RAbdominal: BS normoactive, soft, ND, NT, no hepatosplenomegaly, bowel sounds presentSkin/Extremities: Dry, intact, no rash, no LE edemaNeurological: A)x3, CN II-XII grossly intact, moving all limbs spontaneously, no focal deficits.Studies: Labs: Recent Labs Lab 01/18/251435 01/19/250609 WBC 5.9 6.3 HGB 11.7* 10.8* HCT 37.10* 32.90* MCV 96.9 95.4 PLT 383 338 Recent Labs Lab 01/18/251435 01/18/251716 01/19/250609 NA 141  --  140 K 4.8  --  5.0 CL 107  --  108* CO2 29  --  26 BUN 31*  --  26* CREATININE 1.56*  --  1.21 GLU 102*   < > 85 ANIONGAP 5*  --  6* CALCIUM 9.3  --  8.9 MG 2.1  --  1.9  < > = values in this interval not displayed. Recent Labs Lab 01/18/251435 ALBUMIN 3.0* AST 20 ALT 13 ALKPHOS 113 BILITOT 0.2 Recent Labs Lab 01/19/250609 GLU 85 No results for input(s): SPECGRAV, PHUR, RBCUA, WBCUA, LEUKOCYTESUR, NITRITE, PROTEINUA, GLUCOSEUR, KETONESU, BILIRUBINUR, UROBILINOGEN in the last 168 hours.Microbiology: No results for input(s): LABBLOO, LABURIN, LOWERRESPIRA in the last 168 hours. Imaging: CXRResult Date: 12/15/2023 Bilateral pleural effusions with overlying opacities, likely atelectasis, although superimposed infection cannot be excluded. [facility] Notify System Classification: Routine. Report initiated by: Nila Nephew, MD Reported and signed by: Alberteen Sam, MD  Echo:No results found for this or any previous visit. Assessment/Plan: This is Charles [name], who is a 83 y.o. male with PMHx of diabetes, prostate/gastric/colonic cancer, tobacco use disorder , BPH.  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 # COVID-19 infection - tested positive in the 10th s/p molnupiravir, on room air, asymptomatic at present  # AKI - likely prerenal iso poor oral intake (improving)  #Neuro: - Monitor mental status  #CVS: - Pacer pads on - Avoid BB, CCB or other AV nodal blockers - Map goal > 65 mmHg, if needed consider levophed - keep Mg >2, K >4 - Start heparin infusion - NPO on Monday morning for pacemaker insertion as per cardiology- TTE ordered - Cardiology following  #Pulm: - on room air- no acute concerns  #GI: - Consistent carb diet- Famotidine 20 mg daily - Miralax daily  #ID: - monitor off antibiotics - No COVID tx needed s/p 5 days of Molnupiravir.  #Renal: - BMP daily - monitor Mg, K- repleted Mg #Heme/Onc: - CBC daily- DVT prophylaxis  - on heparin infusion  #Endo: - ISS + POCT glucose AC & HS- TSH and free T4 normal Lines:Central Line: noFoley: noHOB elevated 30 degrees: YesICU Mobility Screening tool:Patient screens in for mobilization by Nursing/PCA staff.  Activity order placed.Code  Status: Full Code Case discussed with Dr. No att. providers found - please refer to the addendum for the final recommendations.  Electronically Signed byCorinne Pleas Koch, MD,7:38 AM1/19/2025

## 2023-12-16 NOTE — ED Notes
 2:59 PM:82 y/o M arrives to the ED c/o weakness for a couple days. Patient states he has had no need for eating and keeping himself hydrated, feels like he has lost a lot of weight. Patient is A+Ox4, able to speak in complete and clear sentences w/ vitals up to date. Patient is changed over, valuables documented, and is placed on cardiac monitor. Patient appears to be in Aflutter, stable, normotensive, is complainging of no pai just weakness. Resident Linna Darner is at bedside evaluating patient and aware of heart rhythm. Past Medical History: Diagnosis Date  Cancer (HC Code) 12/2013  They took all of my stomach out  Enlarged prostate   Melanoma (HC Code)   Stomach cancer (HC Code)  4:43 PM:ED to Floor HandoffAdmission Dx:        Acute pulmonary edema   Telemetry: 	[]  Yes		[x]  NoOxygen Tank on Stretcher >1000 PSI:  [x]  YesCode Status:   [x]  Full		[]  Partial		[]  No CodeIsolation: 	[]  None	[]  Contact	[]  Droplet	[x]  AirborneSafety Precautions: [x]  None	[]  Sitter   []  Restraints	[]  Suicidal	[]  Fall Risk	Other (specify):           Mentation/Orientation:    A&O (person, place, time and situation) x      4    	 		Disoriented to:          Deficits: []  Hearing impaired	[]  Blind  	[]  Nonverbal		 []  ID/DD (intelectual disability/developmental disability)Ambulation: []  Ambulatory	[]  Non-Ambulatory  Diet: [x]  OK to eat	[]  NPO	Other (specify):           IV Access: [x]  Yes   []  No     Vital signs Charted in the last hour: [x] Yes    []  No         Other (specify)            Skin Alteration:[]  Pressure Injury	[]  Wound	[]  None	[x]  Skin Not AssessedDiarrhea/Loose stool : []  1x within 24h  []  2x within 24h  []  3x within 24h  [x]  None      C.Diff Order:  []  Ordered- needs to be collected    []  Collected-sent to lab    []  Resulted - Negative C.Diff  []  Resulted - Positive C.Diff    []  Not Ordered   	[x]  N/APatient Belongings:Does the patient have belongings going to the floor?   [x] Yes    []  NoAre the belongings documented?          [x] Yes    []  NoIs someone taking belongings home?   [] Yes    [x]  No   Who (specify):           _________Katherin Murriel Hopper, RNPlease call the ED RN if you have any questions            You can always call the [facility] ED charge nurse at [phone] if you have any concerns [facility] ED [facility] Emergency Department  5:33 YN:[id number] had transport booked, upon revitalizing, Bp was hypotensive reading 80s/40s, checked x3 times. Notified hospitalist who said to call Rapid response, rapid called at 1713. EKG and BGL taken, BGL Is 75, provided a cup of applesauce to increase BGL. Upon changing patient, patient became unresponsive for a couple seconds, VFIB was present on the monitor and patient returned to aflutter on own after quick sternum rub. 2 EKGS completed by [place] Becca and signed by MD. Patient's BP is now 92/54. Awaiting further orders.7:09 [id number] report given and care deferred to [facility]

## 2023-12-16 NOTE — Plan of Care
 Plan of Care Overview/ Patient StatusPatient admitted from ED; Patient received alert, oriented x 4. PERRL. Denies pain/discomfort. Afebrile. Continuous cardiac monitoring- atrial flutter. HR ranging from 28-70. Long pauses present. Patient asymptomatic. Pacer pads on, defibrillator present at bedside, however remains off. On room air, lungs clear, patient denies shortness of breath. Abdomen soft, non tender. Bowel sounds audible and normoactive. Voiding via bedside urinal. Covid precautions in place. Frequent monitoring maintained. Problem: Adult Inpatient Plan of CareGoal: Plan of Care ReviewOutcome: Interventions implemented as appropriateGoal: Patient-Specific Goal (Individualized)Outcome: Interventions implemented as appropriateGoal: Absence of Hospital-Acquired Illness or InjuryOutcome: Interventions implemented as appropriateGoal: Optimal Comfort and WellbeingOutcome: Interventions implemented as appropriateGoal: Readiness for Transition of CareOutcome: Interventions implemented as appropriate Problem: InfectionGoal: Absence of Infection Signs and SymptomsOutcome: Interventions implemented as appropriate Problem: Fall Injury RiskGoal: Absence of Fall and Fall-Related InjuryOutcome: Interventions implemented as appropriate Problem: Skin Injury Risk IncreasedGoal: Skin Health and IntegrityOutcome: Interventions implemented as appropriate

## 2023-12-16 NOTE — H&P
 [facility]	MICU H&PDate: 1/18/2025Hospital day: 0Attending: Glean Salen MDHistory provided by: the patientHistory limited by: no limitationsHistory: Chief Complaint:This is Charles [name], who is a 83 y.o. male with PMHx of diabetes, prostate/gastric/colonic cancer, tobacco use disorder , BPH. Patient  reports  one-week ago he tested positive for COVID, since then he has been having productive cough weakness and lightheadedness when he stands up from sitting or laying flat position.  He also endorses poor oral intake.  No chest pain, falls, loss of consciousness. On arrival to ED blood pressure 90/61 heart rate of 70 respiratory rate of 24 afebrile saturating 95% on room air.  He arrived awake and alert able to speak comfortable respiratory distress.  While in the ED blood pressure dropped to 80s over 40s blood glucose at that time was noted to be 75 he was provided a cup of applesauce afterwards he became unresponsive for a couple of seconds, per notes be AFib noted on the monitor and patient returned to a flutter after sternal rub. Initial blood work remarkable for no electrolyte abnormalities elevated creatinine 1.5 from baseline 1.0, elevated proBNP more than 3000, hemoglobin stable around 11 and no leukocytosis noted.  He tested positive for COVID.  Chest x-ray showed bilateral pleural effusions with overlying opacities. MICU was called for evaluation. Review of Systems: ROS Review of systems was otherwise unremarkable.Data: Vitals:Last 24 hours: Temp:  [97 ?F (36.1 ?C)-98.1 ?F (36.7 ?C)] 98.1 ?F (36.7 ?C)Pulse:  [56-75] 70Resp:  [17-24] 17BP: (84-116)/(50-61) 116/57SpO2:  [85 %-99 %] 97 %ABG:  No results for input(s): PHART, PCO2ART, PO2ART, HCO3ART, O2SATART, LITERFLOW in the last 168 hours.I/O's:No intake or output data in the 24 hours ending 12/15/23 2051 IVF: noLines: PIVDiet: Diet Cardiac Consistent Carbohydrate  Last BM:   unknownDVT Prophylaxis: enoxaparin 30 mgGI Prophylaxis: Famotidine 20 mgECG/Tele Events: Results for orders placed or performed during the hospital encounter of 12/15/23 EKG Result Value Ref Range  Heart Rate 34 bpm  QRS Interval 70 ms  QT Interval 422 ms  QTC Interval 317 ms  P Axis 97 deg  QRS Axis 30 deg  T Wave Axis 59 deg  P-R Interval 0 msec  SEVERITY Abnormal ECG severity Medications: Antibiotics:Scheduled Meds:Scheduled Meds:Scheduled Medication Medication Dose Route Frequency  dextrose (GLUCOSE) 40 % gel 15 g  15 g Oral Q15 MIN PRN  Or  fruit juice 120 mL  120 mL Oral Q15 MIN PRN  Or  skim milk 240 mL  240 mL Oral Q15 MIN PRN  dextrose (GLUCOSE) 40 % gel 30 g  30 g Oral Q15 MIN PRN  Or  fruit juice 240 mL  240 mL Oral Q15 MIN PRN  dextrose 10% injection 125 mL  12.5 g Intravenous Q15 MIN PRN  dextrose 10% injection 250 mL  25 g Intravenous Q15 MIN PRN  enoxaparin (LOVENOX) injection 30 mg  30 mg Subcutaneous Daily  ethyl alcohol 62 % nasal swab 1 Application  1 Application Nasal Q12H  [START ON 12/16/2023] famotidine (PEPCID) tablet 20 mg  20 mg Oral Daily  glucagon 1 mg in water for injection, sterile 1 mL (1 mg/mL)  1 mg Intramuscular Once PRN  [START ON 12/16/2023] insulin lispro (Admelog, HumaLOG) Correction Scale 1-18 Units  1-18 Units Subcutaneous TID AC  insulin lispro (Admelog, HumaLOG) Correction Scale 1-18 Units  1-18 Units Subcutaneous Nightly  [START ON 12/16/2023] polyethylene glycol (MIRALAX) packet 17 g  17 g Oral Daily  sodium chloride 0.9 % flush 3 mL  3 mL IV Push Q8H  sodium chloride 0.9 % flush 3 mL  3 mL IV Push PRN for Line Care Continuous Infusions:PRN Meds:dextrose (GLUCOSE) 40 % gel 15 g **OR** fruit juice **OR** skim milk, dextrose (GLUCOSE) 40 % gel 30 g **OR** fruit juice, dextrose injection, dextrose injection, glucagon, sodium chlorideExam: Physical ExamGeneral: awake, alert, in no distressNeck: SupplePulm: Clear to auscultation bilaterally, no wheeze rales or rhonchi heard, good air entryCVS: RRR, normal S1/S2, no M/G/RAbdominal: BS normoactive, soft, ND, NT,  bowel sounds presentSkin/Extremities: Dry, intact, no rash, no LE edemaNeurological: awake, alert, moving all limbs spontaneouslyStudies: Labs: Recent Labs Lab 01/18/251435 WBC 5.9 HGB 11.7* HCT 37.10* MCV 96.9 PLT 383 Recent Labs Lab 01/18/251435 01/18/251716 NA 141  --  K 4.8  --  CL 107  --  CO2 29  --  BUN 31*  --  CREATININE 1.56*  --  GLU 102* 75 ANIONGAP 5*  --  CALCIUM 9.3  --  MG 2.1  --  Recent Labs Lab 01/18/251435 ALBUMIN 3.0* AST 20 ALT 13 ALKPHOS 113 BILITOT 0.2 Recent Labs Lab 01/18/251716 GLU 75 No results for input(s): SPECGRAV, PHUR, RBCUA, WBCUA, LEUKOCYTESUR, NITRITE, PROTEINUA, GLUCOSEUR, KETONESU, BILIRUBINUR, UROBILINOGEN in the last 168 hours.Microbiology: No results for input(s): LABBLOO, LABURIN, LOWERRESPIRA in the last 168 hours. Imaging: CXRResult Date: 12/15/2023 Bilateral pleural effusions with overlying opacities, likely atelectasis, although superimposed infection cannot be excluded. [facility] Notify System Classification: Routine. Report initiated by: Nila Nephew, MD Reported and signed by: Alberteen Sam, MD  Echo:No results found for this or any previous visit. Assessment/Plan: This is Charles [name], who is a 83 y.o. male with PMHx of diabetes, prostate/gastric/colonic cancer, tobacco use disorder , BPH. 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 #Renal: - BMP daily #Heme/Onc: - CBC daily- DVT prophylaxis heparin sq #Endo: - ISS + POCT glucose Sedation vacation:noRestraints:  noHOB elevated 30 degrees: YesCode Status: Full Code Case discussed with Dr. Joslyn Hy, Ike Bene - please refer to the addendum for the final recommendations.  Electronically Signed Fredric Dine, MD,5:55 PM1/18/2025

## 2023-12-16 NOTE — Utilization Review (ED)
 Utilization Review from XsolisPatient Data:  Patient Name: Charles [name] Age: 83 y.o. DOB: [birth year]	 MRN: [id number]	 Indicated Status: Inpatient Review Comments: covid  Chest x-ray + for bilateral pleural effusions. Pt also hypotensive. MICU admit.

## 2023-12-16 NOTE — Progress Notes
 MICU Teaching Attending Attestation: I have seen and examined Mr. Lugg with resident/fellow/PA and agree with their assessment and plan as outlined with the following additions and modifications#A-flutter with sick sinus?  #DM#COVID19 s/p molnupiravir#prostate/gastric/colonic cancer hx#Tobacco useOvernight: A-flutter with slow rates, long pauses >6 seconds? Asymptomatic Plan- delirium precautions- avoid sedating meds- continuous tele - pacer pads on- cards consult- avoid BB, CCB or other AV nodal blockers - formal echo- might require TVP vs PPM? - check TSH- keep Mg >2, K >4- defer COVID therapies for now, s/p 5 days of Molnupiravir. On RA therefore no indication for dexamethasone/Remdesivir - diabetic diet- bowel regimen- monitor off abx- monitor UOP, lytes, Cr- switch to Lovenox - PT/OT- POC glucose Q6h- sliding scale Consider transfer to the floors later today based on cards recommendations? Critical Care Attestation: This patient is not critically ill.Glean Salen, MDZubin Jakell Trusty, MDPulmonary, Critical Care and Sleep Medicine [facility]

## 2023-12-16 NOTE — Plan of Care
 Plan of Care Overview/ Patient StatusArthur [name]				Location: UUVO/5366-Y40 y.o., male				Attending: Glean Salen, MD	Admit Date: 12/15/2023			[id number] LOS: 1 day Past Medical History: Diagnosis Date  Cancer (HC Code) 12/2013  They took all of my stomach out  Enlarged prostate   Melanoma (HC Code)   Stomach cancer (HC Code)  Past Surgical History: Procedure Laterality Date  ABDOMINAL SURGERY    GASTRECTOMY    INGUINAL HERNIA REPAIR Bilateral   Medication:I have reviewed the patient's current meds as listed in the patient's EMR., SCHEDULED: Current Facility-Administered Medications Medication Dose Route Frequency Provider Last Rate Last Admin  ethyl alcohol 62 % nasal swab 1 Application  1 Application Nasal Q12H Kerby Moors, Devra Dopp, MD      famotidine (PEPCID) tablet 20 mg  20 mg Oral Daily Kerby Moors, Devra Dopp, MD   20 mg at 12/16/23 0850  heparin bolus from infusion (ACS/AFib bolus) 3,600 Units  60 Units/kg Intravenous Once Carollee Massed, MD      insulin lispro (Admelog, HumaLOG) Correction Scale 1-18 Units  1-18 Units Subcutaneous TID [facility] Kerby Moors, Devra Dopp, MD      insulin lispro (Admelog, HumaLOG) Correction Scale 1-18 Units  1-18 Units Subcutaneous Nightly Kerby Moors, Devra Dopp, MD      magnesium oxide (MAG-OX) tablet 800 mg  800 mg Oral every 2 hours Bernita Raisin, MD   800 mg at 12/16/23 0849  polyethylene glycol (MIRALAX) packet 17 g  17 g Oral Daily Kerby Moors, Devra Dopp, MD      sodium chloride 0.9 % flush 3 mL  3 mL IV Push Q8H Kerby Moors, Devra Dopp, MD   3 mL at 12/16/23 0609 , INFUSIONS:  heparin 25,000 units/250 ml infusion - ACS/Afib   , and PRN: dextrose (GLUCOSE) 40 % gel 15 g **OR** fruit juice **OR** skim milk, dextrose (GLUCOSE) 40 % gel 30 g **OR** fruit juice, dextrose injection, dextrose injection, glucagon, heparin 25,000 units/250 ml infusion - ACS/Afib **AND** heparin (porcine) **AND** heparin (porcine), sodium chloride Nutritional AssessmentTimepoint: AssessmentReason For Assessment: RD screen, per organizational policy, identified at risk by screening criteria, physician consultIdentified At Risk by Screening Criteria:  (low BMI)Patient Reported Diet/Restrictions/Preferences: consistent carbohydrate, low saltCurrent StateCurrent Appetite: fairSpecialty Diet/Nutrition Received: consistent carbohydrate, cardiacNutrition Interventions: referred to dietitian, food preferences provided, supplemental drinks provided, diet liberalizedNutrition Risk Screen: reduced oral intake over the last monthAnthropometric MeasurementsHeight: 6' 1 (185.4 cm) Weight: 59.6 kg Weight Method: Stated Weight Scale Used: Wheelchair  BMI (Calculated): 17.3IBW/kg (Calculated) : 79.9 Usual weight: 59.6 kg CBWWeight Change: 3% weight loss x 6 months, not clinically significant  Nutrition Focused Physical FindingsAdditional Documentation: Physical Appearance (Group)Overall Physical Appearance: loss of subcutaneous fat, loss of muscle mass, generalized wasting, underweightGastrointestinal: other (see comments) (+BM 1/18)Skin: other (see comments) (no PI)Estimated Nutritional NeedsTotal Energy Estimated Needs: 1500-1800 kcal/dayMethod for Estimating Needs: 25-30 kcal/kgTotal Protein Estimated Needs: 72-120 g/dayMethod for Estimating Needs: 1.2-2 g/kgTotal Fluid Estimated Needs: 1500-1800 ml/dayMethod for Estimating Needs: 1 mL/kcalNutrition OrderNutrition Order: meets nutritional requirementsNutrition RiskDate of Last RD visit: 01/19/25Date of next RD visit: 01/24/25Nutrition Risk: patient at nutritional riskLevel of Risk: highNutrition DiagnosisNutrition Diagnosis/Problem: Severe MalnutritionRelated to: chronic illnessAs Evidenced by: severe muscle and fat wastingNutrition Recommendations referred to dietitian, food preferences provided, supplemental drinks provided, diet liberalizedAssessment/PlanPt seen for initial nutritional assessment for poor PO consult. Pt admitted for COVID+, productive cough weakness and lightheadedness, poor oral intake; found to have Aflutter and AKI. Pt reports poor PO intake since onset of COVID 1 week ago. Pt underweight (BMI 17.34) and with severe muscle/fat wasting, see separate severe malnutrition  note. On cardiac consistent carb diet. Labs reviewed; BG 85. Will liberalize to cardiac diet. Will trial Ensure Plus BID.Recommendations/Interventions:Liberalize to cardiac 3gm Na dietEnsure Plus BIDEncourage PO, provide food preferences, assist PO as neededRequest weekly weightsGoals: Patient will consume >75% of needsPatient will consume >75% of nutrition supplementPatient will preserve lean body mass and skin integrityMonitoring/Evaluation:Will monitor 	Weight and weight change	Diet Order and Food Intake	Glucose profile/Labs	Gastrointestinal	SkinElectronically Signed by Alean Rinne, RD, December 16, 2023

## 2023-12-17 ENCOUNTER — Inpatient Hospital Stay: Admit: 2023-12-17 | Payer: PRIVATE HEALTH INSURANCE

## 2023-12-17 LAB — CBC WITH AUTO DIFFERENTIAL
BKR WAM ABSOLUTE IMMATURE GRANULOCYTES.: 0.01 x 1000/ÂµL (ref 0.00–0.30)
BKR WAM ABSOLUTE LYMPHOCYTE COUNT.: 1.73 x 1000/ÂµL (ref 0.60–3.70)
BKR WAM ABSOLUTE NRBC (2 DEC): 0 x 1000/ÂµL (ref 0.00–1.00)
BKR WAM ANC (ABSOLUTE NEUTROPHIL COUNT): 2.87 x 1000/ÂµL (ref 2.00–7.60)
BKR WAM BASOPHIL ABSOLUTE COUNT.: 0.03 x 1000/ÂµL (ref 0.00–1.00)
BKR WAM BASOPHILS: 0.6 % (ref 0.0–1.4)
BKR WAM EOSINOPHIL ABSOLUTE COUNT.: 0 x 1000/ÂµL (ref 0.00–1.00)
BKR WAM EOSINOPHILS: 0 % (ref 0.0–5.0)
BKR WAM HEMATOCRIT (2 DEC): 34.3 % — ABNORMAL LOW (ref 38.50–50.00)
BKR WAM HEMOGLOBIN: 10.3 g/dL — ABNORMAL LOW (ref 13.2–17.1)
BKR WAM IMMATURE GRANULOCYTES: 0.2 % (ref 0.0–1.0)
BKR WAM LYMPHOCYTES: 31.9 % (ref 17.0–50.0)
BKR WAM MCH (PG): 32 pg (ref 27.0–33.0)
BKR WAM MCHC: 30 g/dL — ABNORMAL LOW (ref 31.0–36.0)
BKR WAM MCV: 106.5 fL — ABNORMAL HIGH (ref 80.0–100.0)
BKR WAM MONOCYTE ABSOLUTE COUNT.: 0.78 x 1000/ÂµL (ref 0.00–1.00)
BKR WAM MONOCYTES: 14.4 % — ABNORMAL HIGH (ref 4.0–12.0)
BKR WAM MPV: 10 fL (ref 8.0–12.0)
BKR WAM NEUTROPHILS: 52.9 % (ref 39.0–72.0)
BKR WAM NUCLEATED RED BLOOD CELLS: 0 % (ref 0.0–1.0)
BKR WAM PLATELETS: 253 x1000/ÂµL (ref 150–420)
BKR WAM RDW-CV: 15.1 % — ABNORMAL HIGH (ref 11.0–15.0)
BKR WAM RED BLOOD CELL COUNT.: 3.22 M/ÂµL — ABNORMAL LOW (ref 4.00–6.00)
BKR WAM WHITE BLOOD CELL COUNT: 5.4 x1000/ÂµL (ref 4.0–11.0)

## 2023-12-17 LAB — BASIC METABOLIC PANEL
BKR ANION GAP: 7 (ref 7–17)
BKR BLOOD UREA NITROGEN: 23 mg/dL (ref 8–23)
BKR BUN / CREAT RATIO: 20.2 (ref 8.0–23.0)
BKR CALCIUM: 8.9 mg/dL (ref 8.8–10.2)
BKR CHLORIDE: 105 mmol/L (ref 98–107)
BKR CO2: 27 mmol/L (ref 20–30)
BKR CREATININE DELTA: -0.07
BKR CREATININE: 1.14 mg/dL (ref 0.40–1.30)
BKR EGFR, CREATININE (CKD-EPI 2021): >60 mL/min/{1.73_m2} (ref >=60)
BKR GLUCOSE: 100 mg/dL (ref 70–100)
BKR POTASSIUM: 4.1 mmol/L (ref 3.3–5.3)
BKR SODIUM: 139 mmol/L (ref 136–144)

## 2023-12-17 LAB — PARTIAL THROMBOPLASTIN TIME     (BH GH LMW Q YH)
BKR PARTIAL THROMBOPLASTIN TIME: 52.4 s — ABNORMAL HIGH (ref 23.0–31.0)
BKR PARTIAL THROMBOPLASTIN TIME: 70.6 s — ABNORMAL HIGH (ref 23.0–31.0)
BKR PARTIAL THROMBOPLASTIN TIME: >139 s — CR (ref 23.0–31.0)
BKR PARTIAL THROMBOPLASTIN TIME: >139 s — CR (ref 23.0–31.0)

## 2023-12-17 LAB — MAGNESIUM: BKR MAGNESIUM: 2.1 mg/dL (ref 1.7–2.4)

## 2023-12-17 MED ORDER — PERFLUTREN LIPID MICROSPHERES 1.3 ML/10 ML NS INJ (IN
Freq: Once | INTRAVENOUS | Status: CP | PRN
Start: 2023-12-17 — End: ?
  Administered 2023-12-17: 13:00:00 10.000 mL via INTRAVENOUS

## 2023-12-17 NOTE — Progress Notes
 [facility] [address] [place], [place] [address]UEAVW: [phone]: [phone] [place]:[address], [place] [address]XBMWU: [phone] [place]:[address], [place] [address]Phone: [phone]: [phone] Charles [name] [id number] male 83 y.o. DOB:19-Oct-1942Adm:12/15/2023  1:00 PM day 2 Allergy:AspirinAttending Provider: Mitzi Hansen, Ronaldo Miyamoto* [facility]Primary Cardiologist: [facility] CardiologySubjective: Interim History: Remains in AFlutter with pauses up to 5.8 sec overnight.No dizziness, CP, palps or SOB.  His friend is at the bedside at our visit today. Past Medical History Past Surgical History Past Medical History: Diagnosis Date  Cancer (HC Code) 12/2013  They took all of my stomach out  Enlarged prostate   Melanoma (HC Code)   Stomach cancer (HC Code)   Past Surgical History: Procedure Laterality Date  ABDOMINAL SURGERY    GASTRECTOMY    INGUINAL HERNIA REPAIR Bilateral   Social History Family History Social History Tobacco Use  Smoking status: Former  Smokeless tobacco: Not on file Substance Use Topics  Alcohol use: Yes   Comment: rarely Married No family history on file. Current Medications Scheduled Meds:Current Facility-Administered Medications Medication Dose Route Frequency Provider Last Rate Last Admin  ethyl alcohol 62 % nasal swab 1 Application  1 Application Nasal Q12H Kerby Moors, Devra Dopp, MD      famotidine (PEPCID) tablet 20 mg  20 mg Oral Daily Kerby Moors, Devra Dopp, MD   20 mg at 12/16/23 0850  insulin lispro (Admelog, HumaLOG) Correction Scale 1-18 Units  1-18 Units Subcutaneous AC & HS Caissie, Corinne, MD      polyethylene glycol (MIRALAX) packet 17 g  17 g Oral Daily Kerby Moors, Devra Dopp, MD sodium chloride 0.9 % flush 3 mL  3 mL IV Push Q8H Kerby Moors, Devra Dopp, MD   3 mL at 12/16/23 1410  tamsulosin (FLOMAX) 24 hr capsule 0.4 mg  0.4 mg Oral Nightly Caissie, Corinne, MD   0.4 mg at 12/16/23 2148 Continuous Infusions: heparin 25,000 units/250 ml infusion - ACS/Afib 9 Units/kg/hr (12/17/23 0800) PRN Meds:.dextrose (GLUCOSE) 40 % gel 15 g **OR** fruit juice **OR** skim milk, dextrose (GLUCOSE) 40 % gel 30 g **OR** fruit juice, dextrose injection, dextrose injection, glucagon, heparin 25,000 units/250 ml infusion - ACS/Afib **AND** heparin (porcine) **AND** heparin (porcine), sodium chloride Allergies Allergies Allergen Reactions  Aspirin Other (See Comments)   Patient states he does not have this alergy  Review of Systems  As per HPIObjective: Vitals: Patient Vitals for the past 24 hrs: BP Temp Temp src Pulse Resp SpO2 12/17/23 0800 (!) 142/80 -- -- 75 18 97 % 12/17/23 0700 (!) 159/69 97.7 ?F (36.5 ?C) Axillary 76 20 96 % 12/17/23 0600 (!) 143/64 -- -- 71 19 96 % 12/17/23 0500 (!) 133/58 -- -- 75 14 98 % 12/17/23 0400 134/70 97.6 ?F (36.4 ?C) Axillary (!) 102 19 97 % 12/17/23 0300 93/81 -- -- 64 (!) 10 97 % 12/17/23 0200 120/76 -- -- (!) 117 14 96 % 12/17/23 0100 108/71 -- -- 83 (!) 26 98 % 12/17/23 0000 132/62 -- -- 79 16 96 % 12/16/23 2300 139/79 98 ?F (36.7 ?C) Oral (!) 105 18 97 % 12/16/23 2200 105/61 -- -- 79 18 98 % 12/16/23 2100 125/75 -- -- (!) 50 20 95 % 12/16/23 2000 120/61 -- -- (!) 52 14 97 % 12/16/23 1900 124/83 97.6 ?F (36.4 ?C) Oral 76 (!) 21 95 % 12/16/23 1800 125/87 -- -- (!) 57 19 97 % 12/16/23 1700 (!) 142/90 -- -- 70 20 97 % 12/16/23 1600 (!) 149/92 97.9 ?  F (36.6 ?C) Oral 63 18 98 % 12/16/23 1500 128/84 -- -- 77 (!) 22 95 % 12/16/23 1412 95/68 -- -- -- -- -- 12/16/23 1404 (!) 89/62 -- -- -- -- -- 12/16/23 1400 -- -- -- (!) 92 (!) 24 (!) 93 % 12/16/23 1300 126/75 -- -- 76 -- 96 % 12/16/23 1200 118/75 -- -- 77 15 97 % 12/16/23 1100 119/80 98 ?F (36.7 ?C) Oral 76 (!) 22 99 % 12/16/23 1000 126/86 -- -- (!) 53 19 94 % 12/16/23 0906 (!) 105/51 -- -- 75 15 -- 12/16/23 0900 (!) 78/62 -- -- 75 19 98 % I/O's:Intake/Output Summary (Last 24 hours) at 12/17/2023 0832Last data filed at 12/17/2023 0800Gross per 24 hour Intake 161.7 ml Output 1150 ml Net -988.3 ml Physical Exam: General:  NAD, pleasant and alert.  ThinEyes: anicteric, no xanthelasmaNeck:  no thyromegaly, no JVD, no carotid bruits RESP:  Clear bilaterally with no wheezes, rales or rhonchiCARD:  Regular bradycardic S1 S2 with no murmurs, rubs or gallopsGI:  Soft NT/ND, +BS, no organomegaly, no bruitsMusculoskeletal: No joint swelling, no joint or muscle tendernessExtremities:  No edema, distal pulses + bilaterallyNeuro:  Alert and oriented, no gross motor deficits Skin: warm, dry with no rashesDATA:CBC past week: Recent Labs Lab 01/18/251435 01/19/250609 01/19/251152 01/20/250239 WBC 5.9 6.3 6.7 5.4 HGB 11.7* 10.8* 10.8* 10.3* PLT 383 338 328 253 MCV 96.9 95.4 96.5 106.5* BMP past week: Recent Labs Lab 01/18/251435 01/18/251716 01/18/252122 01/19/250609 01/19/251113 01/19/251742 01/19/252151 01/20/250239 01/20/250753 NA 141  --   --  140  --   --   --  139  --  K 4.8  --   --  5.0  --   --   --  4.1  --  CL 107  --   --  108*  --   --   --  105  --  CO2 29  --   --  26  --   --   --  27  --  ANIONGAP 5*  --   --  6*  --   --   --  7  --  CALCIUM 9.3  --   --  8.9  --   --   --  8.9  --  MG 2.1  --   --  1.9  --   --   --  2.1  --  GLU 102* 75 146* 85 77 91 85 100 84 BUN 31*  --   --  26*  --   --   --  23  --  CREATININE 1.56*  --   --  1.21  --   --   --  1.14  --  		LFTs past week: Recent Labs Lab 01/18/251435 AST 20 ALT 13 ALKPHOS 113 ALBUMIN 3.0* PROT 7.4 INR 1.18* BILITOT 0.2 Coagulation studies past week: Recent Labs Lab 01/18/251435 01/19/251152 01/19/251817 01/20/250239 LABPROT 12.3*  --   --   --  INR 1.18*  --   --   --  PTT  --  41.5* >139.0* >139.0* BMP past week: Recent Labs Lab 01/19/250609 TSH 1.300 Cardiac Markers: No results for input(s): CKTOTAL, TROPTHS, HSTNT1HRDEL0, HSTNT3HRDEL0 in the last 168 hours.Recent Labs Lab 01/18/251435 [facility] 3,988.0*  ECG 12/16/23 reviewed: Aflutter with 4:1 block ASMI, lateral ST changes ?ischemia, 2015 ECG showed NSR TELE: (personally reviewed) atrial flutter with pauses up to 5.8 secNo results found for this or any previous visit.No results found for this or any previous visit.CXRResult Date: 12/15/2023 Bilateral  pleural effusions with overlying opacities, likely atelectasis, although superimposed infection cannot be excluded. [facility] Notify System Classification: Routine. Report initiated by: Nila Nephew, MD Reported and signed by: Alberteen Sam, MD  Assessment: PAFlutter with a slow HR and pauses up to 6 secondsCOVID infectionElevated proBNP and lung infiltrates concerning for CHFRecommendations: Keep in ICU for tele monitoring. NPO after midnight for possible pacemaker tomorrowEcho first thing tomorrow to R/o cardiomyopathyHold IV heparin after midnight tonightDespite CxR and proBNP he is not clinically in CHF so I would hold off on lasix for now.Signed:Jael Waldorf D. Nyra Jabs, MD [facility], [phone]/20/20258:32 AMElectronically Signed by Alger Memos, MD, 12/17/2023

## 2023-12-17 NOTE — Plan of Care
 Plan of Care Overview/ Patient StatusNo reportable events during this shift.  See flowsheet and MAR.  See provider's notes for additional details.

## 2023-12-17 NOTE — Plan of Care
 Plan of Care Overview/ Patient StatusPatient alert, oriented x 4. PERRL. Denies pain/discomfort. Afebrile. Continuous cardiac monitoring- atrial flutter.  Intermittent long pauses on monitor Patient asymptomatic. Pacer pads removed per patient, defibrillator present at bedside. Heparin drip infusing per protocol. On room air, lungs clear, patient denies shortness of breath. Abdomen soft, non tender. Bowel sounds audible and normoactive. Voiding via bedside urinal. Covid precautions in place. Frequent monitoring maintained.

## 2023-12-17 NOTE — Plan of Care
 [facility]Sp[facility] Care NoteAssessment:  Religion:ProtestantInformation Obtained From: PatientObservation or Mood of Visit: Isolated Intervention:  Referral Source: Chaplain InitiatedResponding Chaplain: Chaplain ResidentLanguage or Special Accommodation Rendered?: NoVisit and Intervention Type: Spiritual Visit Spiritual care interventions provided: Cultural, Religious or Spiritual Resources Interventions: : PrayerRelational or Interpersonal Interventions: : Spiritual Support/Presence Outcome: With the help of the chaplain, patient/loved one(s):    Plan:  12/17/2023 2:01 PMPlan of Care Overview/ Patient Status

## 2023-12-17 NOTE — Progress Notes
 [facility]	MICU Progress NoteDate: 1/20/2025Hospital day: 2Attending: de Harolyn Rutherford Rober*Interim History: This is Charles [name], who is a 83 y.o. male with PMHx of diabetes, prostate/gastric/colonic cancer, tobacco use disorder , BPH.  Patient  reports  one-week ago he tested positive for COVID, since then he has been having productive cough weakness and lightheadedness when he stands up from sitting or laying flat position.  He also endorses poor oral intake.  No chest pain, falls, loss of consciousness.   On arrival to ED blood pressure 90/61 heart rate of 70 respiratory rate of 24 afebrile saturating 95% on room air.  He arrived awake and alert able to speak comfortable respiratory distress.  While in the ED blood pressure dropped to 80s over 40s blood glucose at that time was noted to be 75 he was provided a cup of applesauce afterwards he became unresponsive for a couple of seconds, per notes be AFib noted on the monitor and patient returned to a flutter after sternal rub.  Initial blood work remarkable for no electrolyte abnormalities elevated creatinine 1.5 from baseline 1.0, elevated proBNP more than 3000, hemoglobin stable around 11 and no leukocytosis noted.  He tested positive for COVID. Chest x-ray showed bilateral pleural effusions with overlying opacities. Patient was transferred to MICU for close cardiac monitoring on telemetry. Patient was given an additional litre of LR. 1/19: atrial flutter with slow ventricular response in 30-50's with significant pauses. No hemodynamic instability, patient denies any chest pain, SOB, palpitations, lightheadedness  Started heparin infusion. Overnight events: - Atrial flutter with pauses, brady HR 50-60s - asymptomatic - heparin infusion supra therapeutic Data: Vitals:I have reviewed the patient's current vital signs as documented in the patient's EMR.   and Last 24 hours: Temp:  [97.6 ?F (36.4 ?C)-98 ?F (36.7 ?C)] 97.6 ?F (36.4 ?C)Pulse:  [49-117] 71Resp:  [10-26] 19BP: (78-149)/(51-92) 143/64SpO2:  [93 %-99 %] 96 %Vent Settings: N/AABG:  No results for input(s): PHART, PCO2ART, PO2ART, HCO3ART, O2SATART, LITERFLOW in the last 168 hours.I/O's:I have reviewed the patient's current I&O's as documented in the EMR.No intake/output data recorded.Gross Totals (Last 24 hours) at 12/17/2023 0728Last data filed at 12/17/2023 0600Intake 150.89 ml Output 1150 ml Net -999.11 ml Hourly urine output:	0.80 mL/hr/kgDrips: n/aIVF: n/aLines:PIVs (1/18)RASS: 0-->alert and calmDiet: Diet CardiacNutrition Supplements  Last BM: 12/15/23 DVT Prophylaxis: heparin infusion GI Prophylaxis:  famotidine 20 mg daily ECG/Tele Events: Results for orders placed or performed during the hospital encounter of 12/15/23 Cardiac EKG Result Scan  Narrative  Ordered by an unspecified provider. EKG Result Value Ref Range  Heart Rate 34 bpm  QRS Interval 70 ms  QT Interval 422 ms  QTC Interval 317 ms  P Axis 97 deg  QRS Axis 30 deg  T Wave Axis 59 deg  P-R Interval 0 msec  SEVERITY Abnormal ECG severity  Medications: Antibiotics: noneScheduled Meds:Scheduled Medication Medication Dose Route Frequency  dextrose (GLUCOSE) 40 % gel 15 g  15 g Oral Q15 MIN PRN  Or  fruit juice 120 mL  120 mL Oral Q15 MIN PRN  Or  skim milk 240 mL  240 mL Oral Q15 MIN PRN  dextrose (GLUCOSE) 40 % gel 30 g  30 g Oral Q15 MIN PRN  Or  fruit juice 240 mL  240 mL Oral Q15 MIN PRN  dextrose 10% injection 125 mL  12.5 g Intravenous Q15 MIN PRN  dextrose 10% injection 250 mL  25 g Intravenous Q15 MIN PRN  ethyl alcohol 62 % nasal swab 1 Application  1 Application Nasal Q12H  famotidine (PEPCID) tablet 20 mg  20 mg Oral Daily  glucagon 1 mg in water for injection, sterile 1 mL (1 mg/mL)  1 mg Intramuscular Once PRN  heparin ACS/AFib YNH 25,000 units in dextrose 5 % 250 mL (100 units/mL) infusion  7-24 Units/kg/hr Intravenous Continuous  And  heparin bolus from infusion 4,800 Units  80 Units/kg Intravenous Q6H PRN  And  heparin bolus from infusion 2,400 Units  40 Units/kg Intravenous Q6H PRN  insulin lispro (Admelog, HumaLOG) Correction Scale 1-18 Units  1-18 Units Subcutaneous AC & HS  polyethylene glycol (MIRALAX) packet 17 g  17 g Oral Daily  sodium chloride 0.9 % flush 3 mL  3 mL IV Push Q8H  sodium chloride 0.9 % flush 3 mL  3 mL IV Push PRN for Line Care  tamsulosin (FLOMAX) 24 hr capsule 0.4 mg  0.4 mg Oral Nightly Continuous Infusions: heparin 25,000 units/250 ml infusion - ACS/Afib 9 Units/kg/hr (12/17/23 0600) PRN Meds:dextrose (GLUCOSE) 40 % gel 15 g **OR** fruit juice **OR** skim milk, dextrose (GLUCOSE) 40 % gel 30 g **OR** fruit juice, dextrose injection, dextrose injection, glucagon, heparin 25,000 units/250 ml infusion - ACS/Afib **AND** heparin (porcine) **AND** heparin (porcine), sodium chlorideExam: Physical ExamGeneral: alert, cachectic, no acute distressNeck: Supple, no JVD, no bruits.Pulm: Clear to auscultation bilaterally, no wheeze rales or rhonchi heard, good air entryCVS: irregular irregular rhythm, normal S1/S2, no M/G/RAbdominal: BS normoactive, soft, ND, NT, no hepatosplenomegaly, bowel sounds presentSkin/Extremities: Dry, intact, no rash, no LE edemaNeurological: AOx3, CN II-XII grossly intact, moving all limbs spontaneously, no focal deficits.Studies: Labs: Recent Labs Lab 01/19/250609 01/19/251152 01/20/250239 WBC 6.3 6.7 5.4 HGB 10.8* 10.8* 10.3* HCT 32.90* 33.00* 34.30* MCV 95.4 96.5 106.5* PLT 338 328 253 Recent Labs Lab 01/18/251435 01/18/251716 01/19/250609 01/19/251113 01/20/250239 NA 141  --  140  --  139 K 4.8  --  5.0  --  4.1 CL 107  --  108*  --  105 CO2 29  -- 26  --  27 BUN 31*  --  26*  --  23 CREATININE 1.56*  --  1.21  --  1.14 GLU 102*   < > 85   < > 100 ANIONGAP 5*  --  6*  --  7 CALCIUM 9.3  --  8.9  --  8.9 MG 2.1  --  1.9  --   --   < > = values in this interval not displayed. Recent Labs Lab 01/18/251435 ALBUMIN 3.0* AST 20 ALT 13 ALKPHOS 113 BILITOT 0.2 Recent Labs Lab 01/20/250239 GLU 100 No results for input(s): SPECGRAV, PHUR, RBCUA, WBCUA, LEUKOCYTESUR, NITRITE, PROTEINUA, GLUCOSEUR, KETONESU, BILIRUBINUR, UROBILINOGEN in the last 168 hours.Microbiology: No results for input(s): LABBLOO, LABURIN, LOWERRESPIRA in the last 168 hours. Imaging: CXRResult Date: 12/15/2023 Bilateral pleural effusions with overlying opacities, likely atelectasis, although superimposed infection cannot be excluded. [facility] Notify System Classification: Routine. Report initiated by: Nila Nephew, MD Reported and signed by: Alberteen Sam, MD  Echo:No results found for this or any previous visit. Assessment/Plan: This is Charles [name], who is a 83 y.o. male with PMHx of diabetes, prostate/gastric/colonic cancer, tobacco use disorder , BPH.  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 # COVID-19 infection - tested positive in the 10th s/p molnupiravir, on room air, asymptomatic at present  # AKI - likely prerenal iso poor oral intake (improving)  #Neuro: - Monitor mental status  #CVS: - Pacer pads on -  Avoid BB, CCB or other AV nodal blockers - Map goal > 65 mmHg, if needed consider levophed - keep Mg >2, K >4 - Heparin infusion - hold at midnight for pacemaker tomorrow - NPO on Monday morning for pacemaker insertion as per cardiology- TTE ordered - Cardiology following  #Pulm: - on room air- no acute concerns  #GI: - Consistent carb diet- NPO at midnight for possible pacemaker tomorrow - GI prophylaxis with famotidine 20 mg daily - Miralax daily  #ID: - monitor off antibiotics - No COVID tx needed s/p 5 days of Molnupiravir #Renal: - BMP daily - monitor Mg, K- repleted Mg #Heme/Onc: - CBC daily- DVT prophylaxis  - on heparin infusion  #Endo: - ISS + POCT glucose AC & HS- TSH and free T4 normal Lines:Central Line: noFoley: noHOB elevated 30 degrees: YesICU Mobility Screening tool:Patient screens in for mobilization by Nursing/PCA staff.  Activity order placed.Code Status: Full Code Case discussed with Dr. Mitzi Hansen, Ronaldo Miyamoto* - please refer to the addendum for the final recommendations.  Electronically Signed byCorinne Caissie, MD,7:28 AM1/20/2025 Attending attestationI have seen/evaluated the patient and agree with the history, physical exam and plan as documented by the ICU Staff (Resident/ PA-C) with the following modifications and/or additions:SUBJECTIVE: No major events overnightLabs and images reviewedAssessment and plan:83 year old gentleman with history of diabetes mellitus, prostate, gastric and colonic cancer, tobacco use disorder, BPH among other comorbidities.  Patient presented to the hospital a 1 week after being diagnosed with COVID with complaints of productive cough, weakness and lightheadedness when he stands up.  In the emergency room he was noted to be borderline hypotensive with a blood pressure of 90/60 with adequate saturations and a febrile.  While he was in the emergency room he had a drop in his hemodynamics, at the time he was noted to be in AFib.  Patient was admitted to the ICU for further evaluation, he was fluid resuscitated.  While in the ICU on 01/19 he was noted to have atrial flutter with a slow ventricular response in the 30s-50s with a significant pauses.  Patient was started on a heparin drip.Problem listAtrial fibrillation with slow ventricular response/ sick sinus syndromePlanNeuroNo active issues2.  Cardiovascular:Pending pacemaker as per cardiologyEcho 3.  Pulmonary: No active issues at this time4. Infectious disease: Follow up cultures5. Renal:Monitor I/O's urine outputReplete electrolytes as needed6. VW:[id number] regimenCardiac dietNPO after midnight7. Heme: Heparin drip and stop at midnight as per cardiology8. Endo:Monitor blood sugarsISSLines:PIV'sProphylaxis:Heparin dripManuel Verner Chol, MDPulmonary and Critical Care Medicine1/20/2025Parts of this note were dictated using M*Modal transcription software with attempt to correct any dictation errors.  Please feel free to contact for any clarifications.

## 2023-12-18 LAB — BASIC METABOLIC PANEL
BKR ANION GAP: 8 (ref 7–17)
BKR BLOOD UREA NITROGEN: 20 mg/dL (ref 8–23)
BKR BUN / CREAT RATIO: 20.6 (ref 8.0–23.0)
BKR CALCIUM: 7.4 mg/dL — ABNORMAL LOW (ref 8.8–10.2)
BKR CHLORIDE: 113 mmol/L — ABNORMAL HIGH (ref 98–107)
BKR CO2: 22 mmol/L (ref 20–30)
BKR CREATININE DELTA: -0.17
BKR CREATININE: 0.97 mg/dL (ref 0.40–1.30)
BKR EGFR, CREATININE (CKD-EPI 2021): >60 mL/min/{1.73_m2} (ref >=60)
BKR GLUCOSE: 83 mg/dL (ref 70–100)
BKR POTASSIUM: 3.9 mmol/L (ref 3.3–5.3)
BKR SODIUM: 143 mmol/L (ref 136–144)

## 2023-12-18 LAB — CBC WITH AUTO DIFFERENTIAL
BKR WAM ABSOLUTE IMMATURE GRANULOCYTES.: 0.03 x 1000/ÂµL (ref 0.00–0.30)
BKR WAM ABSOLUTE LYMPHOCYTE COUNT.: 1.5 x 1000/ÂµL (ref 0.60–3.70)
BKR WAM ABSOLUTE NRBC (2 DEC): 0 x 1000/ÂµL (ref 0.00–1.00)
BKR WAM ANC (ABSOLUTE NEUTROPHIL COUNT): 3.05 x 1000/ÂµL (ref 2.00–7.60)
BKR WAM BASOPHIL ABSOLUTE COUNT.: 0.02 x 1000/ÂµL (ref 0.00–1.00)
BKR WAM BASOPHILS: 0.4 % (ref 0.0–1.4)
BKR WAM EOSINOPHIL ABSOLUTE COUNT.: 0.01 x 1000/ÂµL (ref 0.00–1.00)
BKR WAM EOSINOPHILS: 0.2 % (ref 0.0–5.0)
BKR WAM HEMATOCRIT (2 DEC): 26.6 % — ABNORMAL LOW (ref 38.50–50.00)
BKR WAM HEMOGLOBIN: 8.7 g/dL — ABNORMAL LOW (ref 13.2–17.1)
BKR WAM IMMATURE GRANULOCYTES: 0.6 % (ref 0.0–1.0)
BKR WAM LYMPHOCYTES: 28.3 % (ref 17.0–50.0)
BKR WAM MCH (PG): 31.1 pg (ref 27.0–33.0)
BKR WAM MCHC: 32.7 g/dL (ref 31.0–36.0)
BKR WAM MCV: 95 fL (ref 80.0–100.0)
BKR WAM MONOCYTE ABSOLUTE COUNT.: 0.69 x 1000/ÂµL (ref 0.00–1.00)
BKR WAM MONOCYTES: 13 % — ABNORMAL HIGH (ref 4.0–12.0)
BKR WAM MPV: 10.8 fL (ref 8.0–12.0)
BKR WAM NEUTROPHILS: 57.5 % (ref 39.0–72.0)
BKR WAM NUCLEATED RED BLOOD CELLS: 0 % (ref 0.0–1.0)
BKR WAM PLATELETS: 272 x1000/ÂµL (ref 150–420)
BKR WAM RDW-CV: 15.2 % — ABNORMAL HIGH (ref 11.0–15.0)
BKR WAM RED BLOOD CELL COUNT.: 2.8 M/ÂµL — ABNORMAL LOW (ref 4.00–6.00)
BKR WAM WHITE BLOOD CELL COUNT: 5.3 x1000/ÂµL (ref 4.0–11.0)

## 2023-12-18 LAB — MAGNESIUM
BKR MAGNESIUM: 1.7 mg/dL (ref 1.7–2.4)
BKR MAGNESIUM: 2 mg/dL (ref 1.7–2.4)

## 2023-12-18 LAB — PARTIAL THROMBOPLASTIN TIME     (BH GH LMW Q YH)
BKR PARTIAL THROMBOPLASTIN TIME: 64.3 s — ABNORMAL HIGH (ref 23.0–31.0)
BKR PARTIAL THROMBOPLASTIN TIME: 97.2 s — ABNORMAL HIGH (ref 23.0–31.0)

## 2023-12-18 LAB — PHOSPHORUS     (BH GH L LMW YH): BKR PHOSPHORUS: 2 mg/dL — ABNORMAL LOW (ref 2.2–4.5)

## 2023-12-18 LAB — POTASSIUM: BKR POTASSIUM: 4.3 mmol/L (ref 3.3–5.3)

## 2023-12-18 MED ORDER — POTASSIUM, SODIUM PHOSPHATES 280 MG-160 MG-250 MG ORAL POWDER PACKET
280-160-250 mg | Freq: Once | ORAL | Status: DC
Start: 2023-12-18 — End: 2023-12-18

## 2023-12-18 NOTE — Progress Notes
 [facility] Progress NoteSubjective/HPI:No acute complaintsHe denies any chest pain, dyspnea, lightheadedness, dizziness, or syncopeI reviewed and, when appropriate updated, the patient's past medical, surgical, family, and social history and his medications and allergies as detailed in the EMR. Physical ExamVitals: Patient Vitals for the past 24 hrs: BP Temp Temp src Pulse Resp SpO2 12/18/23 1100 111/76 -- -- 76 19 97 % 12/18/23 1000 104/61 -- -- (!) 50 17 97 % 12/18/23 0900 102/71 -- -- 65 (!) 24 98 % 12/18/23 0800 112/67 -- -- 75 17 97 % 12/18/23 0700 117/66 98 ?F (36.7 ?C) Oral 76 19 98 % 12/18/23 0600 101/75 98.7 ?F (37.1 ?C) Axillary 75 19 98 % 12/18/23 0500 100/64 -- -- 65 19 97 % 12/18/23 0400 (!) 99/50 -- -- (!) 51 (!) 21 94 % 12/18/23 0300 107/62 -- -- 74 20 95 % 12/18/23 0200 128/76 -- -- 77 18 96 % 12/18/23 0100 120/60 -- -- (!) 53 17 97 % 12/18/23 0000 105/87 98 ?F (36.7 ?C) Axillary (!) 50 20 97 % 12/17/23 2300 100/77 -- -- 73 (!) 24 97 % 12/17/23 2200 95/75 -- -- 71 (!) 26 94 % 12/17/23 2100 (!) 92/58 -- -- (!) 57 18 96 % 12/17/23 2000 101/68 -- -- 76 19 97 % 12/17/23 1900 112/67 -- -- 76 (!) 23 96 % 12/17/23 1800 119/77 -- -- 78 18 98 % 12/17/23 1700 125/78 97.7 ?F (36.5 ?C) Axillary 73 (!) 21 97 % 12/17/23 1600 100/64 -- -- (!) 55 19 96 % 12/17/23 1500 111/70 -- -- 75 (!) 22 94 % 12/17/23 1400 104/77 -- -- (!) 94 20 97 % 12/17/23 1300 (!) 91/59 -- -- 76 18 98 % 12/17/23 1200 112/61 97.7 ?F (36.5 ?C) Axillary (!) 97 (!) 24 98 % Patient Vitals for the past 120 hrs: Weight 12/15/23 1257 59.6 kg General: in no acute distressNeck: no significant JVDCardiac: irreg irreg S1 S2, no murmurLungs: clear bilaterally  Vascular: No lower extremity edema, warm extremities    Current Medications:ethyl alcohol 62 %, 1 Application, Q12Hfamotidine, 20 mg, Dailyinsulin lispro, 1-18 Units, AC & HSpolyethylene glycol, 17 g, Daily[Held by provider] potassium and sodium phosphates (ELEMENTAL phosphorus 250 mg/packet), 500 mg, Oncesodium chloride, 3 mL, Q8Htamsulosin, 0.4 mg, NightlyCurrent Infusions: heparin 25,000 units/250 ml infusion - ACS/Afib Stopped (12/18/23 0034) CBC: Recent Labs Lab 01/21/250610 WBC 5.3 HGB 8.7* PLT 272 MCV 95.0 BMP: Recent Labs Lab 01/21/250610 01/21/250714 NA 143  --  K 3.9  --  CL 113*  --  CO2 22  --  ANIONGAP 8  --  CALCIUM 7.4*  --  MG 1.7  --  PHOS 2.0*  --  GLU 83 94 BUN 20  --  CREATININE 0.97  --  Cardiac Markers: No results for input(s): CKTOTAL, TROPTHS, HSTNT1HRDEL0, HSTNT3HRDEL0 in the last 168 hours.BNP: Recent Labs Lab 01/18/251435 [facility] 3,988.0* I personally reviewed the following tests and studies: Telemetry and EKGs done on this admission, CBC, Metabolic panels, Cardiac Markers, Xrays, and if available, the most recent echocardiogram, cardiac catheterization and/or stress tests, and if relevant the most recent [place] scans. Telemetry (personally reviewed): atrial flutter with slow ventricular response and variable block, pauses up to 4.4 seconds seen AssessmentArthur [name] is a 83 y.o. male who has a history of diabetes mellitus, tobacco smoking, and gastric cancer s/p gastrectomy. The patient presented 12/15/2023  1:00 PM 3 days ago with Covid infection, hypotension, and atrial flutter  Paroxysmal atrial flutter with slow ventricular response and pauses of up to 6  seconds seen this admissionCovid infection Hypotension, improvedRecommendationsEP evaluation, Dr. Ranell Patrick to see later todayNo plans for permanent pacemaker today; tentatively for tomorrow.  Can resume diet and keep NPO after midnightResume IV heparinAvoid all SA/ AV nodal blocking agents.  No indication for temporary pacemaker at this time Hold off on diuretics for now Tele monitoringWill plan for transfer to CCU service for further management of his bradycardiaDiscussed with the ICU team. Guillermina City, PA-C Electronically Signed by Guillermina City, [facility], [address], [place] [address]W [phone]/21/2025,11:45 AMFair[place]:[address] [place], [place] [address]YQIHK:  [phone]: [phone] [place]:[address], [place] [id number]: [phone]: [phone] [place]:[address], [place] [address]CWCBJ:  [phone]: [phone]

## 2023-12-18 NOTE — Plan of Care
 Plan of Care Overview/ Patient StatusCase Management will take lead to arrange post -acute care services and will continue to work with the care team as the patient progresses towards discharge.Admitted for: Weakness/Cough-Covid/Afib-Aflutter with Sick Sinus Syndrome/AKI  Weakness that began after diagnosis of Covid one week ago. Since then he has developed a productive cough with pale colored mucus as well as generalized weakness. He also endorses lightheadedness whenever he stands up from a sitting or laying flat position. In the ED, hypotensive, RR 24, electrolyte abnormalities, Cr 1.5, elevated proBNP 3000. Tested positive for Covid. Chest x-ray showed bilateral pleural effusions with overlying opacities.  Hx from chart: Covid, prostate/gastric/colonic cancer, tobacco use, BPH, diabetes.   Chart reviewed. See flowsheet for documentation and interventions. Case Management Screening and Evaluation  Flowsheet Row Most Recent Value Case Management Screening: Chart review completed. If YES to any question below then proceed to CM Eval/Plan  Is there a change in their cognitive function No Do you anticipate that the pt will have any discharge needs requiring CM intervention? Yes Has there been an unscheduled readmission within the last 30 days and/or four (4) encounters (encounters include: ED, OBS, Inpatient) within the last six (6) months? No Were there services prior to admission ( Examples: Acute [facility],  Assisted Living, HD, Homecare, Extended Care Facility, Methadone, SNF, Outpatient Infusion Center) No Negative/Positive Screen Positive Screening: Complete CM Evaluation and Plan Case manager will take lead to arrange post-acute care services and continue to work with the care team as the patient progresses towards discharge Yes Case Manager Attestation  I have reviewed the medical record and completed the above screen. CM staff will follow patient's progress and discuss the plan of care with the Treatment Team. Yes Case Management Evaluation and Plan  Arrived from prior to admission home/apartment/condo Admitted from: Home Do you have a caregiver, or do you anticipate the need for a caregiver given the change in your physicial function? No Lives with Alone Services Prior to Admission None Patient Requires Care Coordination Intervention Due To Discharge planning needs/concerns Prior to Hospitalization: Assistance Needed/DME being used None Documented Insurance Accurate Yes  [Verified via financial screens.] Any financial concerns related to anticipated discharge needs No Patient's home address verified Yes Patient's PCP of record verified Yes Source of Clinical History  Patient's clinical history has been reviewed and source of Information is: Medical Record, Daughter Case Manager Attestation  I have reviewed the medical record and completed the above evaluation with the following recommendations. Yes Discharge Planning Coordination Recommendations  Discharge Planning Coordination Recommendations Needs not determined at this time Case Manager reviewed plan of care/ continuum of care need's with  Patient Representative, Interdisciplinary Team   Case Management Plan  Flowsheet Row Most Recent Value Finalized Plan  Expected Discharge Date 12/25/23  Patient Covid positive. Screening completed with daughter Cala Bradford who is visiting. Introduced myself and explained the role of the Sports coach. Verbalizes understanding. Demographics was verified. Patient lives alone. Is independent with ADLs and mobility. Had no prior services or DME. Per daughter, gets his care through the [place]. Amy was called and VM left. Daughter also wants to know if patient has POA paperwork in the system. Epic media has nothing that was scanned in. Call was made to [facility], MICU SW so that Advance Care Planning Tool Kit can be completed with the patient. 1:39p - Per Amy from the [place], patient is not service connected. Anticipated D/C plan: Needs not determined at this time. CMT will continue to  follow for medical readiness and service needs. Wallie Renshaw, RN [facility] Manager - [employer]

## 2023-12-18 NOTE — Plan of Care
 Plan of Care Overview/ Patient StatusSOCIAL WORK NOTEPatient Name: Charles [name] Record Number: [id number] Date of Birth: 1942-10-18Medical Social Work Follow Up  [employer] Most Recent Value Admission Information  Document Type Progress Note (For Inpatient/ED Only) Prior psychosocial assessment has been documented within this hospitalization No (For Inpatient/ED Only) Prior psychosocial assessment has been documented within 30 days of this hospitalization No Reason for Current Social Work Involvement Advance Directives/Healthcare Representative Source of Information Patient Record Reviewed Yes Level of Care Inpatient What medium(s) of communication were used with patient/family/caregiver? Face-to-Face / In-Person Psychosocial issues requiring intervention Decision-Making/Advance Directives Psychosocial interventions 20 minutes spent face to face with Charles [name] to discuss health care proxy. I arrived at Charles [name] bed side and he was alert and engaged. I discuss what a health care proxy entails. I provided a pamphlet to Charles [name] with information about a health care proxy. I asked Charles [name] if he wants to appoint a health care proxy. Charles [name] stated, I make my own decisions, I know what I want. I expressed understanding and provided education on the benefits of having a health care proxy. Charles [name] stated that he only wants to make his son his beneficiary, so he can be in charge of his finance. I explained to Charles [name] the different steps needed to make his son his beneficiary. Furthermore, I also provided education on a POA and the necessary steps needed to appointment a POA. I encouraged Charles [name] to have a discussion with his son about appointing him to be his beneficiary. Social worker remains available for ongoing support as needed. Collaborations Interdisciplinary team Specific referrals to enhance community supports (include existing and new resources) No referrals requested during this encounter Handoff Required? Yes Social Work will take lead to arrange post-acute care services and continue work with the care team as patient progresses towards discharge Yes Next Steps/Plan (including hand-off): Social worker will remain avaiable, as needed. Re-appraoch patient in regards to appointing a decision maker/HCP. Signature: Lynnae Sandhoff Contact Information: [phone]

## 2023-12-18 NOTE — Plan of Care
 Plan of Care Overview/ Patient StatusPatient alert, oriented x 4. PERRL. Denies pain/discomfort. Afebrile. Continuous cardiac monitoring- atrial flutter. Defibrillator present at bedside. Heparin drip stopped at midnight per cardiology orders. On room air, lungs clear, patient denies shortness of breath. Abdomen soft, non tender. Bowel sounds audible and normoactive. NPO after midnight for PPM placement.  Voiding via bedside urinal. Covid precautions in place. Frequent monitoring maintained. Problem: Adult Inpatient Plan of CareGoal: Plan of Care ReviewOutcome: Interventions implemented as appropriateGoal: Patient-Specific Goal (Individualized)Outcome: Interventions implemented as appropriateGoal: Absence of Hospital-Acquired Illness or InjuryOutcome: Interventions implemented as appropriateGoal: Optimal Comfort and WellbeingOutcome: Interventions implemented as appropriateGoal: Readiness for Transition of CareOutcome: Interventions implemented as appropriate Problem: InfectionGoal: Absence of Infection Signs and SymptomsOutcome: Interventions implemented as appropriate Problem: Fall Injury RiskGoal: Absence of Fall and Fall-Related InjuryOutcome: Interventions implemented as appropriate Problem: Skin Injury Risk IncreasedGoal: Skin Health and IntegrityOutcome: Interventions implemented as appropriate

## 2023-12-18 NOTE — Plan of Care
 Problem: Adult Inpatient Plan of CareGoal: Plan of Care ReviewOutcome: Interventions implemented as appropriateGoal: Patient-Specific Goal (Individualized)Outcome: Interventions implemented as appropriateGoal: Absence of Hospital-Acquired Illness or InjuryOutcome: Interventions implemented as appropriateGoal: Optimal Comfort and WellbeingOutcome: Interventions implemented as appropriateGoal: Readiness for Transition of CareOutcome: Interventions implemented as appropriate Plan of Care Overview/ Patient StatusAssumed care of patient 0700.Patient alert and oriented. Makes needs known. No report of pain or discomfort. Motor function intact. Out of bed to toilet. Stand by assist. Afebrile. Heparin drip restarted. Labs sent. On room air, clear breath sounds. Continuous monitoring maintained. Refer to flowsheets and [facility] for details.

## 2023-12-18 NOTE — Transfer Summaries
 [facility]	CCU Transfer NoteDate: 1/21/2025Interim History: Charles [name] is an 83 year old male with a history of diabetes, prostate, gastric, and colonic cancer, tobacco use disorder, and BPH. He tested positive for COVID-19 one week ago and has since experienced a productive cough, generalized weakness, and lightheadedness upon standing. He also reports poor oral intake. He denies chest pain, falls, or loss of consciousness.In the ED, the patient presented with a blood pressure of 90/61, heart rate of 70, respiratory rate of 24, afebrile, and saturating 95% on room air. He was awake and alert without significant respiratory distress. While in the ED, his blood pressure dropped to the 80s/40s, and his blood glucose was noted to be 75. Following a cup of applesauce, the patient became unresponsive for a few seconds. Monitor readings revealed atrial fibrillation with conversion to atrial flutter after a sternal rub.On 1/19, the patient was noted to have atrial flutter with a slow ventricular response, with heart rates in the 30-50s and significant pauses, but without hemodynamic instability. He denied chest pain, shortness of breath, palpitations, or lightheadedness, and a heparin infusion was started.On 1/20, the heparin infusion was continued, and the patient?s heart rate improved to the 50-60s while remaining in atrial flutter. An echocardiogram revealed no structural abnormalities, with an ejection fraction of 59%.Overnight from 01/20-01/21 the patient experienced atrial flutter with pauses and bradycardia, with heart rates in the 50-70s. He remained asymptomatic. The heparin infusion was held at midnight, and the patient was kept NPO after midnight. Pacemaker procedure was pushed to tomorrow (01/22) so heparin and diet order will be continued until midnight with plan for procedure tomorrow. Patient was transferred to the CCU under Dr. Doylene Canning.Data: Vitals:Last 24 hours: Temp:  [97.7 ?F (36.5 ?C)-98.7 ?F (37.1 ?C)] 97.8 ?F (36.6 ?C)Pulse:  [50-78] 75Resp:  [13-26] 13BP: (92-134)/(50-87) 97/64SpO2:  [94 %-98 %] 95 %Vent Settings:  On room airI/O's:No intake/output data recorded.Hourly urine output:Drips:Vasoactive: noneSedation: noneIVF: NoneLines: PIV x 2Diet: Consistent carb diet with NPO order at midnightDVT Prophylaxis: Heparin drip --> Will end at midnightECG/Tele Events: Atrial flutter with a rate of 70's per tele review Medications: SCHEDULED: Current Facility-Administered Medications Medication Dose Route Frequency Provider Last Rate Last Admin  ethyl alcohol 62 % nasal swab 1 Application  1 Application Nasal Q12H Kerby Moors, Devra Dopp, MD      famotidine (PEPCID) tablet 20 mg  20 mg Oral Daily Kerby Moors, Devra Dopp, MD   20 mg at 12/17/23 1324  insulin lispro (Admelog, HumaLOG) Correction Scale 1-18 Units  1-18 Units Subcutaneous AC & HS Bernita Raisin, MD      polyethylene glycol (MIRALAX) packet 17 g  17 g Oral Daily Kerby Moors, Devra Dopp, MD      [facility] by provider] potassium and sodium phosphates (ELEMENTAL phosphorus 250 mg/packet) (Phos-NaK) oral powder 500 mg  500 mg Oral Once Caissie, Corinne, MD      sodium chloride 0.9 % flush 3 mL  3 mL IV Push Q8H Kerby Moors, Devra Dopp, MD   3 mL at 12/16/23 1410  tamsulosin (FLOMAX) 24 hr capsule 0.4 mg  0.4 mg Oral Nightly Caissie, Corinne, MD   0.4 mg at 12/17/23 2200 INFUSIONS:  heparin 25,000 units/250 ml infusion - ACS/Afib 9 Units/kg/hr (12/18/23 1222) PRN: dextrose (GLUCOSE) 40 % gel 15 g **OR** fruit juice **OR** skim milk, dextrose (GLUCOSE) 40 % gel 30 g **OR** fruit juice, dextrose injection, dextrose injection, glucagon, heparin 25,000 units/250 ml infusion - ACS/Afib **AND** heparin (porcine) **AND** heparin (porcine), sodium chloride Exam: Physical  ExamConstitutional: Appearance: Normal appearance. Cardiovascular:    Rate and Rhythm: Normal rate. Rhythm irregular.    Pulses: Normal pulses.    Heart sounds: Normal heart sounds. No murmur heard.   No friction rub. No gallop. Pulmonary:    Effort: Pulmonary effort is normal.    Breath sounds: Normal breath sounds. Abdominal:    General: Abdomen is flat.    Palpations: Abdomen is soft. Skin:   General: Skin is warm. Neurological:    Mental Status: He is alert and oriented to person, place, and time. Studies: Labs: Recent Labs Lab 01/19/251152 01/20/250239 01/21/250610 WBC 6.7 5.4 5.3 HGB 10.8* 10.3* 8.7* HCT 33.00* 34.30* 26.60* MCV 96.5 106.5* 95.0 PLT 328 253 272 Recent Labs Lab 01/19/250609 01/19/251113 01/20/250239 01/20/250753 01/21/250610 01/21/250714 01/21/251150 NA 140  --  139  --  143  --   --  K 5.0  --  4.1  --  3.9  --   --  CL 108*  --  105  --  113*  --   --  CO2 26  --  27  --  22  --   --  BUN 26*  --  23  --  20  --   --  CREATININE 1.21  --  1.14  --  0.97  --   --  GLU 85   < > 100   < > 83   < > 121* ANIONGAP 6*  --  7  --  8  --   --  CALCIUM 8.9  --  8.9  --  7.4*  --   --  MG 1.9  --  2.1  --  1.7  --   --  PHOS  --   --   --   --  2.0*  --   --   < > = values in this interval not displayed. Recent Labs Lab 01/18/251435 ALBUMIN 3.0* AST 20 ALT 13 ALKPHOS 113 BILITOT 0.2 Recent Labs Lab 01/21/251150 GLU 121* No results for input(s): SPECGRAV, PHUR, RBCUA, WBCUA, LEUKOCYTESUR, NITRITE, PROTEINUA, GLUCOSEUR, KETONESU, BILIRUBINUR, UROBILINOGEN in the last 168 hours.Microbiology:Diagnostics:Assessment/Plan: Charles [name] is an 83 year old male with a history of diabetes, prostate, gastric, and colonic cancer, tobacco use disorder, and BPH. He tested positive for COVID-19 one week ago and has since experienced a productive cough, generalized weakness, and lightheadedness upon standing. He also reports poor oral intake. 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 # COVID-19 infection - tested positive in the 10th s/p molnupiravir, on room air, asymptomatic at present  # AKI - likely prerenal iso poor oral intake (improving)# Chronic anemia   # Neuro: - Monitor mental status - No active issues # CVS: - Continue monitoring on tele- Avoid BB, CCB or other AV nodal blockers - Map goal > 65 mmHg, if needed consider levophed - keep Mg >2, K >4 - Heparin infusion - holding at midnight for pacemaker tomorrow- Cardiology following - EP evaluation today- Hold off on diuretics for now- Plan for pacemaker tomorrow # Pulm: - On room air- No acute concerns  # GI: - NPO at midnight for pacemaker - GI prophylaxis with famotidine 20 mg daily - Miralax daily  # Renal: - BMP daily - Monitor Mg, K, phos- Replete phosphate # Heme/Onc: - CBC daily- DVT prophylaxis - heparin infusion# Endo: - ISS + POCT glucose AC & HS- TSH and free T4 normal #  ID: - Monitor fever/WBC curve- Monitor off antibiotics - No COVID tx needed s/p 5 days of MolnupiravirPatient has been seen and discussed with attending, please see their final addendum for official recommendations.Jeronimo Greaves, MDInternal MedicinePGY-2[facility]1/21/2025 at 1:59 PM  I saw and evaluated this patient. I obtained the HPI, performed the physical exam and formulated the above assessment and plan for this patient. I discussed the case with the cardiology resident/fellow and we agree with the findings and plan as documented. Additionally:Patient is doing well. Xfer to CCU service today. Plan for flutter ablation / PPM tomorrow based on EP study findings. Critical care time of 30 minutes was directly devoted to managing the patient's current unstable cardiovascular conditions as detailed above, coordinating care, and making critical care decisions. Maryella Shivers, MD, Orlene Erm, MD, FACCAttending PhysicianPriMed Cardiology / [facility] / [facility] Signed by Norris Cross, MD, December 18, 2023

## 2023-12-18 NOTE — Progress Notes
 [facility]	MICU Progress NoteDate: 1/21/2025Hospital day: 3Attending: de Harolyn Rutherford Rober*Interim History: This is Charles [name], who is a 83 y.o. male with PMHx of diabetes, prostate/gastric/colonic cancer, tobacco use disorder , BPH.  Patient  reports  one-week ago he tested positive for COVID, since then he has been having productive cough weakness and lightheadedness when he stands up from sitting or laying flat position.  He also endorses poor oral intake.  No chest pain, falls, loss of consciousness.   On arrival to ED blood pressure 90/61 heart rate of 70 respiratory rate of 24 afebrile saturating 95% on room air.  He arrived awake and alert able to speak comfortable respiratory distress.  While in the ED blood pressure dropped to 80s over 40s blood glucose at that time was noted to be 75 he was provided a cup of applesauce afterwards he became unresponsive for a couple of seconds, per notes be AFib noted on the monitor and patient returned to a flutter after sternal rub.  Initial blood work remarkable for no electrolyte abnormalities elevated creatinine 1.5 from baseline 1.0, elevated proBNP more than 3000, hemoglobin stable around 11 and no leukocytosis noted.  He tested positive for COVID. Chest x-ray showed bilateral pleural effusions with overlying opacities. Patient was transferred to MICU for close cardiac monitoring on telemetry. Patient was given an additional litre of LR. 1/19: atrial flutter with slow ventricular response in 30-50's with significant pauses. No hemodynamic instability, patient denies any chest pain, SOB, palpitations, lightheadedness  Started heparin infusion. 1/20: heparin infusion, heart rate 50-60's in atrial flutter. Echo with no structural abnormalities, EF 59%. Overnight events: - Atrial flutter with pauses, brady HR 50-70s - asymptomatic - heparin infusion held at midnight, NPO at midnight Data: Vitals:I have reviewed the patient's current vital signs as documented in the patient's EMR.   and Last 24 hours: Temp:  [97.7 ?F (36.5 ?C)-98.7 ?F (37.1 ?C)] 98.7 ?F (37.1 ?C)Pulse:  [50-97] 76Resp:  [16-26] 19BP: (91-142)/(50-87) 117/66SpO2:  [94 %-98 %] 98 %Vent Settings: N/AABG:  No results for input(s): PHART, PCO2ART, PO2ART, HCO3ART, O2SATART, LITERFLOW in the last 168 hours.I/O's:I have reviewed the patient's current I&O's as documented in the EMR.No intake/output data recorded.Gross Totals (Last 24 hours) at 12/18/2023 0716Last data filed at 12/18/2023 0156Intake 99.13 ml Output 575 ml Net -475.87 ml Hourly urine output:	0.39 mL/hr/kgDrips: n/aIVF: n/aLines:PIVs (1/18 + 1/19)RASS: 0-->alert and calmDiet: Nutrition SupplementsDiet NPO Time Specified  Last BM: 12/15/23 DVT Prophylaxis: heparin infusion held for procedure GI Prophylaxis:  famotidine 20 mg daily ECG/Tele Events: Results for orders placed or performed during the hospital encounter of 12/15/23 Cardiac EKG Result Scan  Narrative  Ordered by an unspecified provider. EKG Result Value Ref Range  Heart Rate 34 bpm  QRS Interval 70 ms  QT Interval 422 ms  QTC Interval 317 ms  P Axis 97 deg  QRS Axis 30 deg  T Wave Axis 59 deg  P-R Interval 0 msec  SEVERITY Abnormal ECG severity  Medications: Antibiotics: noneScheduled Meds:Scheduled Medication Medication Dose Route Frequency  dextrose (GLUCOSE) 40 % gel 15 g  15 g Oral Q15 MIN PRN  Or  fruit juice 120 mL  120 mL Oral Q15 MIN PRN  Or  skim milk 240 mL  240 mL Oral Q15 MIN PRN  dextrose (GLUCOSE) 40 % gel 30 g  30 g Oral Q15 MIN PRN  Or  fruit juice 240 mL  240 mL Oral Q15 MIN PRN  dextrose 10% injection 125 mL  12.5 g  Intravenous Q15 MIN PRN  dextrose 10% injection 250 mL  25 g Intravenous Q15 MIN PRN  ethyl alcohol 62 % nasal swab 1 Application  1 Application Nasal Q12H  famotidine (PEPCID) tablet 20 mg  20 mg Oral Daily  glucagon 1 mg in water for injection, sterile 1 mL (1 mg/mL)  1 mg Intramuscular Once PRN  [Held by provider] heparin ACS/AFib YNH 25,000 units in dextrose 5 % 250 mL (100 units/mL) infusion  7-24 Units/kg/hr Intravenous Continuous  And  [Held by provider] heparin bolus from infusion 4,800 Units  80 Units/kg Intravenous Q6H PRN  And  [Held by provider] heparin bolus from infusion 2,400 Units  40 Units/kg Intravenous Q6H PRN  insulin lispro (Admelog, HumaLOG) Correction Scale 1-18 Units  1-18 Units Subcutaneous AC & HS  polyethylene glycol (MIRALAX) packet 17 g  17 g Oral Daily  sodium chloride 0.9 % flush 3 mL  3 mL IV Push Q8H  sodium chloride 0.9 % flush 3 mL  3 mL IV Push PRN for Line Care  tamsulosin (FLOMAX) 24 hr capsule 0.4 mg  0.4 mg Oral Nightly Continuous Infusions: [Held by provider] heparin 25,000 units/250 ml infusion - ACS/Afib Stopped (12/18/23 0034) PRN Meds:dextrose (GLUCOSE) 40 % gel 15 g **OR** fruit juice **OR** skim milk, dextrose (GLUCOSE) 40 % gel 30 g **OR** fruit juice, dextrose injection, dextrose injection, glucagon, [Held by provider] heparin 25,000 units/250 ml infusion - ACS/Afib **AND** [Held by provider] heparin (porcine) **AND** [Held by provider] heparin (porcine), sodium chlorideExam: Physical ExamGeneral: alert, cachectic, no acute distressNeck: Supple, no JVD, no bruits.Pulm: Clear to auscultation bilaterally, no wheeze rales or rhonchi heard, good air entryCVS: irregular irregular rhythm, normal S1/S2, no M/G/RAbdominal: BS normoactive, soft, ND, NT, no hepatosplenomegaly, bowel sounds presentSkin/Extremities: Dry, intact, no rash, no LE edemaNeurological: AOx3, CN II-XII grossly intact, moving all limbs spontaneously, no focal deficits.Studies: Labs: Recent Labs Lab 01/19/251152 01/20/250239 01/21/250610 WBC 6.7 5.4 5.3 HGB 10.8* 10.3* 8.7* HCT 33.00* 34.30* 26.60* MCV 96.5 106.5* 95.0 PLT 328 253 272 Recent Labs Lab 01/19/250609 01/19/251113 01/20/250239 01/20/250753 01/21/250610 01/21/250714 NA 140  --  139  --  143  --  K 5.0  --  4.1  --  3.9  --  CL 108*  --  105  --  113*  --  CO2 26  --  27  --  22  --  BUN 26*  --  23  --  20  --  CREATININE 1.21  --  1.14  --  0.97  --  GLU 85   < > 100   < > 83 94 ANIONGAP 6*  --  7  --  8  --  CALCIUM 8.9  --  8.9  --  7.4*  --  MG 1.9  --  2.1  --  1.7  --  PHOS  --   --   --   --  2.0*  --   < > = values in this interval not displayed. Recent Labs Lab 01/18/251435 ALBUMIN 3.0* AST 20 ALT 13 ALKPHOS 113 BILITOT 0.2 Recent Labs Lab 01/21/250714 GLU 94 No results for input(s): SPECGRAV, PHUR, RBCUA, WBCUA, LEUKOCYTESUR, NITRITE, PROTEINUA, GLUCOSEUR, KETONESU, BILIRUBINUR, UROBILINOGEN in the last 168 hours.Microbiology: No results for input(s): LABBLOO, LABURIN, LOWERRESPIRA in the last 168 hours. Imaging: CXRResult Date: 12/15/2023 Bilateral pleural effusions with overlying opacities, likely atelectasis, although superimposed infection cannot be excluded. [facility] Notify System Classification: Routine. Report initiated by: Nila Nephew, MD Reported and signed by:  Alberteen Sam, MD  Echo:Results for orders placed or performed during the hospital encounter of 12/15/23 Echocardiogram Complete (TTE) Result Value Ref Range  Reported Biplane EF% 59 %  Narrative   * Technically limited study.* Normal left ventricular cavity size.  Normal left ventricle wall thickness.  No regional wall motion abnormalities.  Normal left ventricular systolic function.  LVEF calculated by biplane Simpson's was 59%.  No thrombus visualized in the left ventricle.  Diastolic function was difficult to determine due to atrial flutter.* Normal right ventricular cavity size.  Mildly decreased right ventricular systolic function.  Estimated right ventricular systolic pressure is 26 mmHg.* Visually the left atrium appears normal.  No interatrial shunt by color Doppler.  Visually the right atrium appears normal.* No significant valvular abnormalities.* IVC diameter > 2.1 cm that collapses < 50% with a sniff suggests high RAP (10-20 mmHg, mean 15 mmHg).* Moderate pericardial effusion adjacent to the right ventricle.  Overall echocardiographic findings suggest no hemodynamic compromise.* No prior study available for comparison.  Assessment/Plan: This is Charles [name], who is a 83 y.o. male with PMHx of diabetes, prostate/gastric/colonic cancer, tobacco use disorder , BPH.  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 # COVID-19 infection - tested positive in the 10th s/p molnupiravir, on room air, asymptomatic at present  # AKI - likely prerenal iso poor oral intake (improving)# Chronic anemia   #Neuro: - Monitor mental status  #CVS: - Avoid BB, CCB or other AV nodal blockers - Map goal > 65 mmHg, if needed consider levophed - keep Mg >2, K >4 - Heparin infusion - held at midnight for pacemaker today- Cardiology following  #Pulm: - on room air- no acute concerns  #GI: - NPO since midnight for pacemaker - GI prophylaxis with famotidine 20 mg daily - Miralax daily  #ID: - monitor off antibiotics - No COVID tx needed s/p 5 days of Molnupiravir #Renal: - BMP daily - monitor Mg, K, phos- replete phosphate #Heme/Onc: - CBC daily- DVT prophylaxis - heparin infusion on hold (post procedure?) #Endo: - ISS + POCT glucose AC & HS- TSH and free T4 normal Lines:Central Line: noFoley: noHOB elevated 30 degrees: YesICU Mobility Screening tool:Patient screens in for mobilization by Nursing/PCA staff.  Activity order placed.Code Status: Full Code Case discussed with Dr. Mitzi Hansen, Ronaldo Miyamoto* - please refer to the addendum for the final recommendations.  Electronically Signed byCorinne Caissie, MD,7:16 AM1/21/2025 Attending attestationI have seen/evaluated the patient and agree with the history, physical exam and plan as documented by the ICU Staff (Resident/ PA-C) with the following modifications and/or additions:SUBJECTIVE: No major events overnightLabs and images reviewedAssessment and plan:83 year old gentleman with history of diabetes mellitus, prostate, gastric and colonic cancer, tobacco use disorder, BPH among other comorbidities.  Patient presented to the hospital a 1 week after being diagnosed with COVID with complaints of productive cough, weakness and lightheadedness when he stands up.  In the emergency room he was noted to be borderline hypotensive with a blood pressure of 90/60 with adequate saturations and a febrile.  While he was in the emergency room he had a drop in his hemodynamics, at the time he was noted to be in AFib.  Patient was admitted to the ICU for further evaluation, he was fluid resuscitated.  While in the ICU on 01/19 he was noted to have atrial flutter with a slow ventricular response in the 30s-50s with a significant pauses.  Patient was started on a heparin drip.Problem  listAtrial fibrillation with slow ventricular response/ sick sinus syndromePlanNeuroNo active issues2.  Cardiovascular:Pending pacemaker as per cardiology3.  Pulmonary: No active issues at this time4. Infectious disease: Follow up cultures5. Renal:Monitor I/O's urine outputReplete electrolytes as needed6. WG:NFAOZHYQMVHQION regimenCardiac dietNPO for procedure7. Heme: Heparin drip on hold as per cardiology for procedure8. Endo:Monitor blood sugarsISSLines:PIV'sProphylaxis:Heparin drip on hold for procedureManuel Verner Chol, MDPulmonary and Critical Care Medicine1/21/2025Critical Care Attestation: The patient has required 45 minutes of my undivided attention during the past 24 hours for one of the previously noted life threatening diagnoses. This excludes any time spent in procedures.Parts of this note were dictated using M*Modal transcription software with attempt to correct any dictation errors.  Please feel free to contact for any clarifications.

## 2023-12-19 ENCOUNTER — Inpatient Hospital Stay: Admit: 2023-12-19 | Payer: PRIVATE HEALTH INSURANCE | Attending: Certified Registered"

## 2023-12-19 DIAGNOSIS — J810 Acute pulmonary edema: Secondary | ICD-10-CM

## 2023-12-19 LAB — CBC WITH AUTO DIFFERENTIAL
BKR WAM ABSOLUTE IMMATURE GRANULOCYTES.: 0.02 x 1000/ÂµL (ref 0.00–0.30)
BKR WAM ABSOLUTE LYMPHOCYTE COUNT.: 1.75 x 1000/ÂµL (ref 0.60–3.70)
BKR WAM ABSOLUTE NRBC (2 DEC): 0 x 1000/ÂµL (ref 0.00–1.00)
BKR WAM ANC (ABSOLUTE NEUTROPHIL COUNT): 3 x 1000/ÂµL (ref 2.00–7.60)
BKR WAM BASOPHIL ABSOLUTE COUNT.: 0.02 x 1000/ÂµL (ref 0.00–1.00)
BKR WAM BASOPHILS: 0.4 % (ref 0.0–1.4)
BKR WAM EOSINOPHIL ABSOLUTE COUNT.: 0.02 x 1000/ÂµL (ref 0.00–1.00)
BKR WAM EOSINOPHILS: 0.4 % (ref 0.0–5.0)
BKR WAM HEMATOCRIT (2 DEC): 29.3 % — ABNORMAL LOW (ref 38.50–50.00)
BKR WAM HEMOGLOBIN: 9.4 g/dL — ABNORMAL LOW (ref 13.2–17.1)
BKR WAM IMMATURE GRANULOCYTES: 0.4 % (ref 0.0–1.0)
BKR WAM LYMPHOCYTES: 31.6 % (ref 17.0–50.0)
BKR WAM MCH (PG): 30.6 pg (ref 27.0–33.0)
BKR WAM MCHC: 32.1 g/dL (ref 31.0–36.0)
BKR WAM MCV: 95.4 fL (ref 80.0–100.0)
BKR WAM MONOCYTE ABSOLUTE COUNT.: 0.73 x 1000/ÂµL (ref 0.00–1.00)
BKR WAM MONOCYTES: 13.2 % — ABNORMAL HIGH (ref 4.0–12.0)
BKR WAM MPV: 9.9 fL (ref 8.0–12.0)
BKR WAM NEUTROPHILS: 54 % (ref 39.0–72.0)
BKR WAM NUCLEATED RED BLOOD CELLS: 0 % (ref 0.0–1.0)
BKR WAM PLATELETS: 307 x1000/ÂµL (ref 150–420)
BKR WAM RDW-CV: 15.5 % — ABNORMAL HIGH (ref 11.0–15.0)
BKR WAM RED BLOOD CELL COUNT.: 3.07 M/ÂµL — ABNORMAL LOW (ref 4.00–6.00)
BKR WAM WHITE BLOOD CELL COUNT: 5.5 x1000/ÂµL (ref 4.0–11.0)

## 2023-12-19 LAB — BASIC METABOLIC PANEL
BKR ANION GAP: 8 (ref 7–17)
BKR BLOOD UREA NITROGEN: 21 mg/dL (ref 8–23)
BKR BUN / CREAT RATIO: 20.6 (ref 8.0–23.0)
BKR CALCIUM: 8.7 mg/dL — ABNORMAL LOW (ref 8.8–10.2)
BKR CHLORIDE: 110 mmol/L — ABNORMAL HIGH (ref 98–107)
BKR CO2: 26 mmol/L (ref 20–30)
BKR CREATININE DELTA: 0.05
BKR CREATININE: 1.02 mg/dL (ref 0.40–1.30)
BKR EGFR, CREATININE (CKD-EPI 2021): >60 mL/min/{1.73_m2} (ref >=60)
BKR GLUCOSE: 99 mg/dL (ref 70–100)
BKR POTASSIUM: 4.1 mmol/L (ref 3.3–5.3)
BKR SODIUM: 144 mmol/L (ref 136–144)

## 2023-12-19 LAB — PARTIAL THROMBOPLASTIN TIME     (BH GH LMW Q YH): BKR PARTIAL THROMBOPLASTIN TIME: 40.9 s — ABNORMAL HIGH (ref 23.0–31.0)

## 2023-12-19 LAB — PHOSPHORUS     (BH GH L LMW YH): BKR PHOSPHORUS: 2.5 mg/dL (ref 2.2–4.5)

## 2023-12-19 LAB — MAGNESIUM: BKR MAGNESIUM: 2 mg/dL (ref 1.7–2.4)

## 2023-12-19 MED ORDER — BUPIVACAINE (PF) 0.5 % (5 MG/ML) INJECTION SOLUTION
0.5 | Status: CP
Start: 2023-12-19 — End: ?

## 2023-12-19 MED ORDER — EPHEDRINE 25 MG/5 ML IN 0.9% SODIUM CHLORIDE (WRAPPED ERX)
25 | INTRAVENOUS | Status: DC | PRN
Start: 2023-12-19 — End: 2023-12-19
  Administered 2023-12-19: 16:00:00 25 mg/5 mL (5 mg/mL) via INTRAVENOUS

## 2023-12-19 MED ORDER — LIDOCAINE HCL 20 MG/ML (2 %) INJECTION SOLUTION
20 | Status: CP
Start: 2023-12-19 — End: ?

## 2023-12-19 MED ORDER — PROPOFOL 10 MG/ML INTRAVENOUS EMULSION
10 | INTRAVENOUS | Status: DC | PRN
Start: 2023-12-19 — End: 2023-12-19
  Administered 2023-12-19: 15:00:00 10 mL/h via INTRAVENOUS

## 2023-12-19 MED ORDER — FENTANYL (PF) 50 MCG/ML INJECTION SOLUTION
50 | INTRAVENOUS | Status: DC | PRN
Start: 2023-12-19 — End: 2023-12-19
  Administered 2023-12-19: 15:00:00 50 mcg/mL via INTRAVENOUS

## 2023-12-19 MED ORDER — CEFAZOLIN 1 GRAM SOLUTION FOR INJECTION
1 | Status: CP
Start: 2023-12-19 — End: ?

## 2023-12-19 MED ORDER — PHENYLEPHRINE 1 MG/10 ML (100 MCG/ML) IN 0.9 % SOD.CHLORIDE IV SYRINGE
1 | INTRAVENOUS | Status: DC | PRN
Start: 2023-12-19 — End: 2023-12-19
  Administered 2023-12-19: 16:00:00 1 mg/0 mL (00 mcg/mL) via INTRAVENOUS

## 2023-12-19 MED ORDER — CEFAZOLIN 1 GRAM SOLUTION FOR INJECTION
1 | INTRAVENOUS | Status: DC | PRN
Start: 2023-12-19 — End: 2023-12-19
  Administered 2023-12-19: 15:00:00 1 gram via INTRAVENOUS

## 2023-12-19 MED ORDER — MINOCYCLINE 100 MG CAPSULE
100 | Freq: Two times a day (BID) | ORAL | Status: DC
Start: 2023-12-19 — End: 2023-12-21
  Administered 2023-12-19 – 2023-12-20 (×2): 100 mg via ORAL

## 2023-12-19 MED ORDER — FENTANYL (PF) 50 MCG/ML INJECTION SOLUTION
50 | Status: CP
Start: 2023-12-19 — End: ?

## 2023-12-19 NOTE — Other
 Post Anesthesia Transfer of Care NotePatient: Charles MitchellProcedure(s) Performed: Procedure(s) (LRB):PACEMAKER IMPLANT (Left)Last Vitals: I have reviewed the post-operative vital signs during the handoff as noted in the Epic chart.POSTOP HANDOFF :      Patient Location:  ICU     Level of Consciousness:  Awake     VS stable since last recorded intra-op set? Yes       Oxygen source: room airPatient co-morbidities, intra-operative course, intake & output and antibiotics as per Anesthesia record were discussed with the RN.

## 2023-12-19 NOTE — Anesthesia Pre-Procedure Evaluation
 This is a 83 y.o. male scheduled for PACEMAKER IMPLANT (Left).Review of Systems/ Medical History Patient summary, nursing notes, EKG/Cardiac Studies , Labs, pre-procedure vitals, height, weight and NPO status reviewed.No previous anesthesia concernsAnesthesia Evaluation:   No history of anesthetic complications  Estimated body mass index.12/15/23 : 17.34 kg/m? Last patient weight recorded. 12/15/23 : 59.6 kg Last patient height recorded. 12/15/23 : 6' 1 (1.854 m) CC/HPI: 83 year old male with a history of diabetes, prostate, gastric, and colonic cancer, tobacco use disorder, and BPH. Covid + > 1 week priorPast Surgical History:  Past Surgical History:No date: ABDOMINAL SURGERYNo date: GASTRECTOMYNo date: INGUINAL HERNIA REPAIR; BilateralCardiovascular: -Dysrhythmia(s): yesRespiratory:      -Covid: Pt has a history of Covid, -Airway disorders:      -Asthma: noSkeletal/Skin:  -Skin and Connective Tissue:  Patient has skin neoplasm.Gastrointestinal/Genitourinary: Gastrointestinal Disorders:  Patient has gastrointestinal neoplasm. Patient has no GERD.Nutritional Disorders: Pt is cachectic per BMI definition, (BMI <20).Lower Genitourinary Disorders:  He has BPH.Endocrine/Metabolic: -Diabetes mellitus:  Patient has diabetes mellitus.Basic Metabolic ProfileRecent Labs   19/14/782956 01/22/250734 01/22/251123 NA 144  --   --  K 4.1  --   --  CL 110*  --   --  CO2 26  --   --  GLU 99   < > 99 BUN 21  --   --  CREATININE 1.02  --   --  CALCIUM 8.7*  --   --   < > = values in this interval not displayed. Complete Blood CountRecent Labs   01/22/250354 WBC 5.5 HGB 9.4* HCT 29.30* PLT 307 Coagulation ProfileRecent Labs   01/18/251435 01/19/251152 01/22/250354 INR 1.18*  --   --  PTT  --    < > 40.9*  < > = values in this interval not displayed. ImagingCXRResult Date: 12/15/2023 Bilateral pleural effusions with overlying opacities, likely atelectasis, although superimposed infection cannot be excluded. [facility] Notify [facility] Classification: Routine. Report initiated by: Nila Nephew, MD Reported and signed by: Alberteen Sam, MD XR Shoulder Left Min 2 ViewsResult Date: 06/23/2023  No definite acute fracture or dislocation. Slight irregularity at the proximal humeral metaphysis may represent artifact. Correlation with point tenderness could be pursued as warranted [facility] Center: Routine. Report initiated by: Katheran James, MD Reported and signed by: Evalee Mutton, MD   [facility] for orders placed or performed during the hospital encounter of 12/15/23 Cardiac EKG Result Scan  Narrative  Ordered by an unspecified provider. EKG Result Value Ref Range  Heart Rate 34 bpm  QRS Interval 70 ms  QT Interval 422 ms  QTC Interval 317 ms  P Axis 97 deg  QRS Axis 30 deg  T Wave Axis 59 deg  P-R Interval 0 msec  SEVERITY Abnormal ECG severity  EchocardiogramResults for orders placed or performed during the hospital encounter of 12/15/23 Echocardiogram Complete (TTE) Result Value Ref Range  Reported Biplane EF% 59 %  Narrative   * Technically limited study.* Normal left ventricular cavity size.  Normal left ventricle wall thickness.  No regional wall motion abnormalities.  Normal left ventricular systolic function.  LVEF calculated by biplane Simpson's was 59%.  No thrombus visualized in the left ventricle.  Diastolic function was difficult to determine due to atrial flutter.* Normal right ventricular cavity size.  Mildly decreased right ventricular systolic function.  Estimated right ventricular systolic pressure is 26 mmHg.* Visually the left atrium appears normal.  No interatrial shunt by color Doppler.  Visually the right atrium appears normal.* No significant valvular abnormalities.* IVC diameter >  2.1 cm that collapses < 50% with a sniff suggests high RAP (10-20 mmHg, mean 15 mmHg).* Moderate pericardial effusion adjacent to the right ventricle.  Overall echocardiographic findings suggest no hemodynamic compromise.* No prior study available for comparison.  Physical ExamCardiovascular:      Rhythm: irregularPulmonary:  normal exam  Airway:  Mallampati: IITM distance: >3 FBNeck ROM: fullDental:  unremarkable  Anesthesia PlanASA 3 - emergent The primary anesthesia plan is  general. Perioperative Code Status confirmed: It is my understanding that the patient is currently designated as 'Full Code' and will remain so throughout the perioperative period.Anesthesia informed consent obtained. Consent obtained from: patientUse of blood products: consented  The post operative pain plan is IV analgesics.Plan discussed with Attending and CRNA.Anesthesiologist's Pre Op NoteI personally evaluated and examined the patient prior to the intra-operative phase of care on the day of the procedure..  Pt seen and evaluated in the preoperative area. The anesthetic plan as outlined above was discussed in detail with the patient including plan for sedation. Discussed possibility of recall with sedation and possibility of requiring backup GA via ETT. Discussed that risks/benefits include but not limited to sore throat, PNA, risk of injury to lips/teeth/mouth/throat, risk of injury to any organ in body including heart/kidneys/liver/lungs/brain/etc, risk of stroke.  All questions answered. Pt consents to anesthesia care.

## 2023-12-19 NOTE — Progress Notes
 [facility]	CCU Progress NoteDate: 1/22/2025Interim History: Charles [name] is an 83 year old male with a history of diabetes, prostate, gastric, and colonic cancer, tobacco use disorder, and BPH. He tested positive for COVID-19 one week ago and has since experienced a productive cough, generalized weakness, and lightheadedness upon standing. He also reports poor oral intake. He denies chest pain, falls, or loss of consciousness.In the ED, the patient presented with a blood pressure of 90/61, heart rate of 70, respiratory rate of 24, afebrile, and saturating 95% on room air. He was awake and alert without significant respiratory distress. While in the ED, his blood pressure dropped to the 80s/40s, and his blood glucose was noted to be 75. Following a cup of applesauce, the patient became unresponsive for a few seconds. Monitor readings revealed atrial fibrillation with conversion to atrial flutter after a sternal rub.Initial blood work remarkable for no electrolyte abnormalities elevated creatinine 1.5 from baseline 1.0, elevated proBNP more than 3000, hemoglobin stable around 11 and no leukocytosis noted. He tested positive for COVID. Chest x-ray showed bilateral pleural effusions with overlying opacities. Patient was transferred to MICU for close cardiac monitoring on telemetry. On 1/19, the patient was noted to have atrial flutter with a slow ventricular response, with heart rates in the 30-50s and significant pauses, but without hemodynamic instability. He denied chest pain, shortness of breath, palpitations, or lightheadedness, and a heparin infusion was started.On 1/20, the heparin infusion was continued, and the patient?s heart rate improved to the 50-60s while remaining in atrial flutter. An echocardiogram revealed no structural abnormalities, with an ejection fraction of 59%.Overnight from 01/20-01/21 the patient experienced atrial flutter with pauses and bradycardia, with heart rates in the 50-70s. He remained asymptomatic.Patient was transferred to the CCU under Dr. Doylene Canning.Pacemaker procedure was pushed to 01/22 so heparin and diet order was continued until midnight.Overnight Events:Continuous cardiac monitoring - Atrial flutterInfrequent episodes of 3 to 6 second pauses overnight. Patient asymptomatic during episodes. Data: Vitals:Last 24 hours: Temp:  [97.1 ?F (36.2 ?C)-98.1 ?F (36.7 ?C)] 98.1 ?F (36.7 ?C)Pulse:  [42-77] 43Resp:  [10-24] 16BP: (93-134)/(54-84) 120/63SpO2:  [95 %-98 %] 98 %Vent Settings:  On room airI/O's:No intake/output data recorded.Hourly urine output:Drips:Vasoactive: noneSedation: noneIVF: NoneLines: PIV x 2Diet: NPO after midnightDVT Prophylaxis: Heparin drip on  holdECG/Tele Events: Atrial flutter with a rate of 50's per tele review Medications: SCHEDULED: Current Facility-Administered Medications Medication Dose Route Frequency Provider Last Rate Last Admin  ethyl alcohol 62 % nasal swab 1 Application  1 Application Nasal Q12H Kerby Moors, Devra Dopp, MD      famotidine (PEPCID) tablet 20 mg  20 mg Oral Daily Kerby Moors, Devra Dopp, MD   20 mg at 12/17/23 5409  insulin lispro (Admelog, HumaLOG) Correction Scale 1-18 Units  1-18 Units Subcutaneous AC & HS Bernita Raisin, MD   1 Units at 12/18/23 1808  polyethylene glycol (MIRALAX) packet 17 g  17 g Oral Daily Kerby Moors, Devra Dopp, MD      sodium chloride 0.9 % flush 3 mL  3 mL IV Push Q8H Kerby Moors, Devra Dopp, MD   3 mL at 12/18/23 2132  tamsulosin (FLOMAX) 24 hr capsule 0.4 mg  0.4 mg Oral Nightly Bernita Raisin, MD   0.4 mg at 12/18/23 2132 INFUSIONS: PRN: dextrose (GLUCOSE) 40 % gel 15 g **OR** fruit juice **OR** skim milk, dextrose (GLUCOSE) 40 % gel 30 g **OR** fruit juice, dextrose injection, dextrose injection, glucagon, [EXPIRED] heparin 25,000 units/250 ml infusion - ACS/Afib **AND** heparin (porcine) **AND** heparin (porcine), sodium  chloride Exam: Physical ExamConstitutional:     Appearance: Normal appearance. Cardiovascular:    Rate and Rhythm: Normal rate. Rhythm irregular.    Pulses: Normal pulses.    Heart sounds: Normal heart sounds. No murmur heard.   No friction rub. No gallop. Pulmonary:    Effort: Pulmonary effort is normal.    Breath sounds: Normal breath sounds. Abdominal:    General: Abdomen is flat.    Palpations: Abdomen is soft. Skin:   General: Skin is warm. Neurological:    Mental Status: He is alert and oriented to person, place, and time. Studies: Labs: Recent Labs Lab 01/20/250239 01/21/250610 01/22/250354 WBC 5.4 5.3 5.5 HGB 10.3* 8.7* 9.4* HCT 34.30* 26.60* 29.30* MCV 106.5* 95.0 95.4 PLT 253 272 307 Recent Labs Lab 01/20/250239 01/20/250753 01/21/250610 01/21/250714 01/22/250354 NA 139  --  143  --  144 K 4.1  --  3.9   < > 4.1 CL 105  --  113*  --  110* CO2 27  --  22  --  26 BUN 23  --  20  --  21 CREATININE 1.14  --  0.97  --  1.02 GLU 100   < > 83   < > 99 ANIONGAP 7  --  8  --  8 CALCIUM 8.9  --  7.4*  --  8.7* MG 2.1  --  1.7   < > 2.0 PHOS  --    < > 2.0*  --  2.5  < > = values in this interval not displayed. Recent Labs Lab 01/18/251435 ALBUMIN 3.0* AST 20 ALT 13 ALKPHOS 113 BILITOT 0.2 Recent Labs Lab 01/22/250354 GLU 99 No results for input(s): SPECGRAV, PHUR, RBCUA, WBCUA, LEUKOCYTESUR, NITRITE, PROTEINUA, GLUCOSEUR, KETONESU, BILIRUBINUR, UROBILINOGEN in the last 168 hours.Microbiology:Diagnostics:Assessment/Plan: Charles [name] is an 83 year old male with a history of diabetes, prostate, gastric, and colonic cancer, tobacco use disorder, and BPH. He tested positive for COVID-19 one week ago and has since experienced a productive cough, generalized weakness, and lightheadedness upon standing. He also reports poor oral intake. 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 - CHADSVASC 3# COVID-19 infection - tested positive in the 10th s/p molnupiravir, on room air, asymptomatic at present  # AKI - likely prerenal iso poor oral intake (improving)# Chronic Normocytic Anemia   # Neuro: - Monitor mental status - No active issues # CVS: - Continue monitoring on tele- Avoid BB, CCB or other AV nodal blockers - Map goal > 65 mmHg, if needed consider levophed - keep Mg >2, K >4 - Hold off on diuretics for now- Plan for procedure today # Pulm: - On room air- No acute concerns  # GI: - NPO since midnight for procedure- GI prophylaxis with famotidine 20 mg daily - Miralax daily  # Renal: - BMP daily - Monitor Mg, K, phos- Replete phosphate # Heme/Onc: - CBC daily- DVT prophylaxis - heparin infusion on hold# Endo: - ISS + POCT glucose AC & HS- TSH and free T4 normal # ID: - Monitor fever/WBC curve- Monitor off antibiotics - No COVID tx needed s/p 5 days of MolnupiravirPatient has been seen and discussed with attending, please see their final addendum for official recommendations.Jeronimo Greaves, MDInternal MedicinePGY-2[facility]1/22/2025 at 1:59 PM I saw and evaluated this patient. I obtained the HPI, performed the physical exam and formulated the above assessment and plan for this patient. I  discussed the case with the cardiology resident/fellow and we agree with the findings and plan as documented. Additionally:Patient with several long pauses overnight up to 7 seconds while in atrial flutter. S/p pacemaker insertion today after patient declined flutter ablation. There is likely some degree of AV nodal disease so pacemaker is reasonable even if we at some point decide to ablate flutter. Can likely be discharged tomorrow after CXR.Critical care time of 30 minutes was directly devoted to managing the patient's current unstable cardiovascular conditions as detailed above, coordinating care, and making critical care decisions. Maryella Shivers, MD, Orlene Erm, MD, FACCAttending PhysicianPriMed Cardiology / [facility] / [facility] Signed by Norris Cross, MD, December 19, 2023

## 2023-12-19 NOTE — Plan of Care
 Plan of Care Overview/ Patient StatusReceived pt alert and oriented x4. No complaints of pain. Atrial rhythym, atrial flutter. Heart rate 20s - 70s. Patient not symptomatic during bradycardic episodes.Pt taken to cath lab, PPM placed: VDIR 50-130. Site clean, dry. Pacing spikes and capture seen on telemetry monitor. Rhythm remains in A-flutter with HR in the 60s-100s.

## 2023-12-19 NOTE — Plan of Care
 Plan of Care Overview/ Patient StatusPatient alert, oriented x 4. PERRL. Denies pain/discomfort. Afebrile. Continuous cardiac monitoring- atrial flutter. Infrequent episodes of 3 to 6 second pauses overnight. Patient asymptomatic during episodes.  Heparin drip infusing per protocol,  stopped at midnight per orders for PPM placement this AM. On room air, lungs clear, patient denies shortness of breath. Abdomen soft, non tender. Bowel sounds audible and normoactive. NPO after midnight for PPM placement.  Voiding via bedside urinal. Covid precautions in place. Frequent monitoring maintained.

## 2023-12-19 NOTE — Progress Notes
 HVC HANDOFF REPORTTo view medication administration, contrast administration,vital signs, and procedure details:Procedure documented in: Epic: see flowsheets, eMAR and Procedure log: (To access Procedure Log: go to chart review - imaging - double click on appropriate procedure - click on Procedure Log on right side of window)Verbal Report given to: MICU RN Erie Noe Procedure performed: permanent pacemaker insertionAdditional comments: Was procedure done with anesthesia Support? Yes (Please refer to anesthesia report in Epic: to review anesthesia documentation, go to chart review, encounters and open the correlating anesthesia event)Patient transferring on Oxygen: NoProcedural Access Site: Non-Vascular: chest See Epic documentation for LDAsElectronically signed:Jarelis Ehlert Dewitt Hoes, RN 12/19/2023, 4:02 PM

## 2023-12-19 NOTE — Other
 PACEMAKER IMPLANTATION PROCEDURE NOTEProcedure Date: 01/22/25Operators: Jaynie Collins, MDReferring:NEMG [facility] Cardiology, MDHistory: Charles [name] is an 83 year old male with a history of diabetes, prostate, gastric, and colonic cancer, tobacco use disorder, and BPH. He tested positive for COVID-19 one week ago and has since experienced a productive cough, generalized weakness, and lightheadedness upon standing. He also reports poor oral intake. He denies chest pain, falls, or loss of consciousness.Patient was brought to the hospital by friend because he was driving erratically. In the ED, the patient presented with a blood pressure of 90/61, heart rate of 70, respiratory rate of 24, afebrile, and saturating 95% on room air. He was awake and alert without significant respiratory distress. While in the ED, his blood pressure dropped to the 80s/40s, and his blood glucose was noted to be 75. Following a cup of applesauce, the patient became unresponsive for a few seconds. Monitor readings revealed atrial fibrillation with conversion to atrial flutter after a sternal rub. On 1/19, the patient was noted to have atrial flutter with a slow ventricular response, with heart rates in the 30-50s and significant pauses, but without hemodynamic instability. He denied chest pain, shortness of breath, palpitations, or lightheadedness, and a heparin infusion was started. On 1/20, the heparin infusion was continued, and the patient?s heart rate improved to the 50-60s while remaining in atrial flutter. An echocardiogram revealed no structural abnormalities, with an ejection fraction of 59%Patient had  slow ventricular response rates with a severe bradycardia pauses up to 6 seconds.He deferred management for AFL he was agreeable to pacemaker insertion for management of bradycardia.Pre-Op Diagnosis:  Symptomatic bradycardia Post-Op Diagnosis:  SameProcedure: 1. Implantation of dual-chamber pacemakerProcedure Summary/Findings: The risks, benefits, alternatives of the device implant with sedation were explained before the procedure.  Risks explained include cardiac tamponade, pneumothorax, bleeding, infection, vascular injury, MI, CVA, emergent sternotomy, intubation, death.  Written informed consent was obtained.  Patient was deemed appropriate to proceed with monitored anesthesia care.  The patient was sedated by the anesthesia nursing staff.  IV antibiotics were given at the beginning of the procedure as standard protocol.After applying standard sterile precautions, local anesthetic with 2% lidocaine with epinephrine was administered to the prepectoral region.  Percutaneous L sided axillary venous access was obtained via the modified Seldinger technique under fluoroscopic guidance and a wire was advanced to the inferior vena cava.  The skin was incised 5 cm & dissected to fascia.  A pocket was created over the pectoralis muscle using blunt dissection.  Hemostasis was obtained with electrocautery.  An additional axillary venipuncture was performed under fluoroscopic guidance and wire again advanced to the inferior vena cava.A hemostatic peel-away sheath was inserted over one of the wires and a 48F ventricular lead was advanced.  Using a slightly curved stylet, this lead was actively fixed to the right ventricular apexafter confirming adequate numbers on interrogation.  LAO showed septal position of the lead.  Next a hemostatic peel-away sheath was advanced over the remaining wire and a 48F atrial lead was advanced.  Using a J stylet, this lead was actively fixed to the right atrial appendage after confirming adequate numbers on interrogation.  Pacing at 10V did not cause any chest wall or diaphragmatic stimulation.  Optimal lead slack was confirmed.  The leads were secured to the muscle with Ethibond.The pocket was irrigated thoroughly with antibiotic solution and hemostasis was confirmed.  A new generator was brought onto the field and connected to the leads.  The generator and leads were introduced into the pocket.  The generator was tied to the muscle with 0 silk sutures.  Arista was used for additional hemostasis.  In 3-layer standard fashion, the pocket was sutured with Vicryl to provide adequate closure.  The wound was dressed with gauze & surgical tape.  Pressure dressing was applied.At the conclusion of the procedure, fluoroscopy revealed no pneumothorax, lead dislodgment, retained sponges, and a normal heart border on LAO view.  The patient tolerated the procedure well and was returned to a telemetry bed in stable condition.  Device:  Medtronic Model [id number], Serial # [id number] GRA: Model [id number], Serial # [id number]: Model [id number], Serial # [id number] wave: 0.9 mVRA pacing impedance: 646 ohmsRA threshold: AFLR wave: 12.3 mVRV pacing impedance: 665 ohmsRV threshold: 0.5V @ 0.5 msSettings: VDIR 50-130Estimated Blood Loss:  MinimalBlood Administered:  NoneGrafts or Implants:  Device as aboveAny Specimen Removed:  NoneComplications:  NoneAnesthesia:  Monitored anesthesia careMedications:  Ancef, LidocaineConclusion: 1. Successful implantation of a dual-chamber pacemaker2. No apparent immediate complicationsPost-Procedure Plan:1. Readmit to medical wards/CCU3. Resume cardiac diet.4. EKG after procedure.5. CXR tomorrow morning.6. Device interrogation tomorrow morning.7. Patient instructed to keep wound completely dry for 1 week & not elevate device-side arm past the horizontal for 2 weeks.  Sling provided.8. Hold anticoagulants x 48 hours.9. Pain control: Tylenol & Tylenol #3 as needed.10. Antibiotics: Minocycline 100mg  BID x7 days.11. Follow-up in 2 weeks in EP Clinic.U.C. Paulyne Mooty, MD, [facility] -Cardiac ElectrophysiologistTel [phone]

## 2023-12-19 NOTE — Other
 [facility] Cardiology Electrophysiology Consultation Charles [name] [id number] male 83 y.o. DOB:Sep 13, 1942Date of Consult: 01/22/25Chief Complaint:  Chief Complaint Patient presents with  Weakness   Starting approx 1 week ago when he was dx with Covid  Cough   +congested cough, bringing up pale colored mucous. Denies fever. Decreased PO intake. Denies N/V/D. Charles [name] presents today for evaluation and management of Weakness (Starting approx 1 week ago when he was dx with Covid) and Cough (+congested cough, bringing up pale colored mucous. Denies fever. Decreased PO intake. Denies N/V/D.)History of Present Illness:83 year old man with a history of hypertension, diabetes, prostate, gastric, and colonic cancer.Patient states that he was driving home with a friend after church.  His friend, commandeered h because he was driving erraticallyIn the ED, the patient presented with a blood pressure of 90/61, heart rate of 70, respiratory rate of 24, afebrile, and saturating 95% on room air. He was awake and alert without significant respiratory distress. While in the ED, his blood pressure dropped to the 80s/40s, and his blood glucose was noted to be 75. EKG revealed atrial flutter with a slow ventricular response.Patient has had bradycardia with heart rates as low as 30-50, pauses > 6 secondsPast Medical History: Past Medical History: Diagnosis Date  Cancer (HC Code) 12/2013  They took all of my stomach out  Enlarged prostate   Melanoma (HC Code)   Stomach cancer (HC Code)  Past Surgical History: Past Surgical History: Procedure Laterality Date  ABDOMINAL SURGERY    GASTRECTOMY    INGUINAL HERNIA REPAIR Bilateral   Allergies: AspirinMedications:@ENCMEDSTART @Social  History: Charles [name]  reports that he has quit smoking. He does not have any smokeless tobacco history on file. He reports current alcohol use. He reports that he does not use drugs. MarriedFamily History: His family history is not on file.Review of Systems:A focused review of systems as it pertains to this visit was done with significant positive and negative symptoms reported above. Physical Exam:Vitals: Blood pressure 125/60, pulse (!) 57, temperature 97.7 ?F (36.5 ?C), temperature source Oral, resp. rate (!) 21, height 6' 1 (1.854 m), weight 59.6 kg, SpO2 96%.Body mass index is 17.34 kg/m?Marland KitchenWt Readings from Last 3 Encounters: 12/15/23 59.6 kg 06/23/23 61.5 kg 04/13/16 88.3 kg BP Readings from Last 3 Encounters: 12/19/23 125/60 06/23/23 115/67 05/16/23 (!) 100/51 General: NAD, cacheticNeck: Supple, normal carotid pulse, no significant JVD Cardiac: Normal S1 S2, no murmur Lungs: Clear lungs bilaterally Vascular: No lower extremity edema, palpable pulses, warm extremitiesData:EKG: All tracings personally reviewed. Echo:Results for orders placed or performed during the hospital encounter of 12/15/23 Echocardiogram Complete (TTE) Result Value Ref Range  Reported Biplane EF% 59 %  Narrative   * Technically limited study.* Normal left ventricular cavity size.  Normal left ventricle wall thickness.  No regional wall motion abnormalities.  Normal left ventricular systolic function.  LVEF calculated by biplane Simpson's was 59%.  No thrombus visualized in the left ventricle.  Diastolic function was difficult to determine due to atrial flutter.* Normal right ventricular cavity size.  Mildly decreased right ventricular systolic function.  Estimated right ventricular systolic pressure is 26 mmHg.* Visually the left atrium appears normal.  No interatrial shunt by color Doppler.  Visually the right atrium appears normal.* No significant valvular abnormalities.* IVC diameter > 2.1 cm that collapses < 50% with a sniff suggests high RAP (10-20 mmHg, mean 15 mmHg).* Moderate pericardial effusion adjacent to the right ventricle.  Overall echocardiographic findings suggest no hemodynamic compromise.* No prior study available for comparison. Stress test:No results  found for this or any previous visit.Lipid PanelTC No results found for: CHOL LDL No results found for: LDL HDL No results found for: HDL Trigs No results found for: TRIG Metabolic PanelCr Creatinine (mg/dL) Date Value 25/95/6387 1.02 12/18/2023 0.97 12/17/2023 1.14 12/16/2023 1.21 12/15/2023 1.56 (H)  BUN BUN (mg/dL) Date Value 56/43/3295 21 12/18/2023 20 12/17/2023 23 12/16/2023 26 (H) 12/15/2023 31 (H)  Na Sodium (mmol/L) Date Value 12/19/2023 144 12/18/2023 143 12/17/2023 139 12/16/2023 140 12/15/2023 141  K Potassium (mmol/L) Date Value 12/19/2023 4.1 12/18/2023 4.3 12/18/2023 3.9 12/17/2023 4.1 12/16/2023 5.0  A1C No results found for: HGBA1C HematologyHgb Hemoglobin (g/dL) Date Value 18/84/1660 9.4 (L) 12/18/2023 8.7 (L) 12/17/2023 10.3 (L) 12/16/2023 10.8 (L) 12/16/2023 10.8 (L)  Plt Platelets (x1000/?L) Date Value 12/19/2023 307 12/18/2023 272 12/17/2023 253 12/16/2023 328 12/16/2023 338  AnticoagulationINR INR (no units) Date Value 12/15/2023 1.18 (H)  The ASCVD Risk score (Arnett DK, et al., 2019) failed to calculate for the following reasons:  The 2019 ASCVD risk score is only valid for ages 47 to 39I personally reviewed the patient's most recent blood work, EKG (prior to today's visit), Echo(s) and/or stress test(s) if available, cardiac imaging or radiology imaging, and outside records from recent encounters if available, as they pertain to this office visit. TTE 01/2025Normal left ventricular cavity size. Normal left ventricle wall thickness. No regional wall motion abnormalities. Normal left ventricular systolic function. LVEF calculated by biplane Simpson's was 59%. No thrombus visualized in the left ventricle. 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 Khalib Fendley, MD FACCNEMG-[facility] [address], [place] [address] [phone]/22/2025,2:45 PMPost Visit Medication List: Medication List  ASK your doctor about these medications  aspirin 81 mg EC delayed release tablet atorvastatin 80 mg tabletCommonly known as: LIPITOR METFORMIN ORAL TAMSULOSIN ORAL  [place]:[address], [place] [address]Phone:  [phone]: [phone] [place]:[address], [place] [address]BTDVV: [phone]: [phone] [place]:[address], [place] [address]Phone: [phone]: [phone] [place]:[address] [place], [place] [address]CHENI:  [phone]: [phone]

## 2023-12-20 ENCOUNTER — Inpatient Hospital Stay: Admit: 2023-12-20 | Payer: PRIVATE HEALTH INSURANCE

## 2023-12-20 ENCOUNTER — Telehealth: Admit: 2023-12-20 | Payer: PRIVATE HEALTH INSURANCE | Attending: Cardiovascular Disease

## 2023-12-20 DIAGNOSIS — I959 Hypotension, unspecified: Secondary | ICD-10-CM

## 2023-12-20 DIAGNOSIS — N179 Acute kidney failure, unspecified: Secondary | ICD-10-CM

## 2023-12-20 DIAGNOSIS — I5032 Chronic diastolic (congestive) heart failure: Secondary | ICD-10-CM

## 2023-12-20 DIAGNOSIS — J90 Pleural effusion, not elsewhere classified: Secondary | ICD-10-CM

## 2023-12-20 DIAGNOSIS — J810 Acute pulmonary edema: Secondary | ICD-10-CM

## 2023-12-20 DIAGNOSIS — U071 COVID-19: Secondary | ICD-10-CM

## 2023-12-20 DIAGNOSIS — Z7982 Long term (current) use of aspirin: Secondary | ICD-10-CM

## 2023-12-20 DIAGNOSIS — Z79899 Other long term (current) drug therapy: Secondary | ICD-10-CM

## 2023-12-20 DIAGNOSIS — D649 Anemia, unspecified: Secondary | ICD-10-CM

## 2023-12-20 DIAGNOSIS — Z87891 Personal history of nicotine dependence: Secondary | ICD-10-CM

## 2023-12-20 DIAGNOSIS — Z85028 Personal history of other malignant neoplasm of stomach: Secondary | ICD-10-CM

## 2023-12-20 DIAGNOSIS — I110 Hypertensive heart disease with heart failure: Secondary | ICD-10-CM

## 2023-12-20 DIAGNOSIS — Z903 Acquired absence of stomach [part of]: Secondary | ICD-10-CM

## 2023-12-20 DIAGNOSIS — Z7984 Long term (current) use of oral hypoglycemic drugs: Secondary | ICD-10-CM

## 2023-12-20 DIAGNOSIS — N400 Enlarged prostate without lower urinary tract symptoms: Secondary | ICD-10-CM

## 2023-12-20 DIAGNOSIS — Z85820 Personal history of malignant melanoma of skin: Secondary | ICD-10-CM

## 2023-12-20 DIAGNOSIS — I4891 Unspecified atrial fibrillation: Secondary | ICD-10-CM

## 2023-12-20 DIAGNOSIS — I5030 Unspecified diastolic (congestive) heart failure: Secondary | ICD-10-CM

## 2023-12-20 DIAGNOSIS — I495 Sick sinus syndrome: Secondary | ICD-10-CM

## 2023-12-20 DIAGNOSIS — R64 Cachexia: Secondary | ICD-10-CM

## 2023-12-20 DIAGNOSIS — Z886 Allergy status to analgesic agent status: Secondary | ICD-10-CM

## 2023-12-20 DIAGNOSIS — E119 Type 2 diabetes mellitus without complications: Secondary | ICD-10-CM

## 2023-12-20 DIAGNOSIS — Z681 Body mass index (BMI) 19 or less, adult: Secondary | ICD-10-CM

## 2023-12-20 DIAGNOSIS — I483 Typical atrial flutter: Secondary | ICD-10-CM

## 2023-12-20 DIAGNOSIS — E43 Unspecified severe protein-calorie malnutrition: Secondary | ICD-10-CM

## 2023-12-20 LAB — CBC WITH AUTO DIFFERENTIAL
BKR WAM ABSOLUTE IMMATURE GRANULOCYTES.: 0.02 x 1000/ÂµL (ref 0.00–0.30)
BKR WAM ABSOLUTE LYMPHOCYTE COUNT.: 1.05 x 1000/ÂµL (ref 0.60–3.70)
BKR WAM ABSOLUTE NRBC (2 DEC): 0 x 1000/ÂµL (ref 0.00–1.00)
BKR WAM ANC (ABSOLUTE NEUTROPHIL COUNT): 5.93 x 1000/ÂµL (ref 2.00–7.60)
BKR WAM BASOPHIL ABSOLUTE COUNT.: 0.02 x 1000/ÂµL (ref 0.00–1.00)
BKR WAM BASOPHILS: 0.3 % (ref 0.0–1.4)
BKR WAM EOSINOPHIL ABSOLUTE COUNT.: 0 x 1000/ÂµL (ref 0.00–1.00)
BKR WAM EOSINOPHILS: 0 % (ref 0.0–5.0)
BKR WAM HEMATOCRIT (2 DEC): 32.1 % — ABNORMAL LOW (ref 38.50–50.00)
BKR WAM HEMOGLOBIN: 10.6 g/dL — ABNORMAL LOW (ref 13.2–17.1)
BKR WAM IMMATURE GRANULOCYTES: 0.3 % (ref 0.0–1.0)
BKR WAM LYMPHOCYTES: 13.2 % — ABNORMAL LOW (ref 17.0–50.0)
BKR WAM MCH (PG): 31.5 pg (ref 27.0–33.0)
BKR WAM MCHC: 33 g/dL (ref 31.0–36.0)
BKR WAM MCV: 95.5 fL (ref 80.0–100.0)
BKR WAM MONOCYTE ABSOLUTE COUNT.: 0.94 x 1000/ÂµL (ref 0.00–1.00)
BKR WAM MONOCYTES: 11.8 % (ref 4.0–12.0)
BKR WAM MPV: 10 fL (ref 8.0–12.0)
BKR WAM NEUTROPHILS: 74.4 % — ABNORMAL HIGH (ref 39.0–72.0)
BKR WAM NUCLEATED RED BLOOD CELLS: 0 % (ref 0.0–1.0)
BKR WAM PLATELETS: 286 x1000/ÂµL (ref 150–420)
BKR WAM RDW-CV: 15.2 % — ABNORMAL HIGH (ref 11.0–15.0)
BKR WAM RED BLOOD CELL COUNT.: 3.36 M/ÂµL — ABNORMAL LOW (ref 4.00–6.00)
BKR WAM WHITE BLOOD CELL COUNT: 8 x1000/ÂµL (ref 4.0–11.0)

## 2023-12-20 LAB — PHOSPHORUS     (BH GH L LMW YH): BKR PHOSPHORUS: 3.1 mg/dL (ref 2.2–4.5)

## 2023-12-20 LAB — BASIC METABOLIC PANEL
BKR ANION GAP: 13 (ref 7–17)
BKR BLOOD UREA NITROGEN: 19 mg/dL (ref 8–23)
BKR BUN / CREAT RATIO: 19.8 (ref 8.0–23.0)
BKR CALCIUM: 9.5 mg/dL (ref 8.8–10.2)
BKR CHLORIDE: 106 mmol/L (ref 98–107)
BKR CO2: 24 mmol/L (ref 20–30)
BKR CREATININE DELTA: -0.06
BKR CREATININE: 0.96 mg/dL (ref 0.40–1.30)
BKR EGFR, CREATININE (CKD-EPI 2021): >60 mL/min/{1.73_m2} (ref >=60)
BKR GLUCOSE: 90 mg/dL (ref 70–100)
BKR POTASSIUM: 4.2 mmol/L (ref 3.3–5.3)
BKR SODIUM: 143 mmol/L (ref 136–144)

## 2023-12-20 LAB — MAGNESIUM: BKR MAGNESIUM: 1.8 mg/dL (ref 1.7–2.4)

## 2023-12-20 MED ORDER — MINOCYCLINE 100 MG CAPSULE
100 | ORAL_CAPSULE | Freq: Two times a day (BID) | ORAL | 1 refills | Status: AC
Start: 2023-12-20 — End: ?

## 2023-12-20 MED ORDER — ACETAMINOPHEN 325 MG TABLET
325 | Freq: Four times a day (QID) | ORAL | Status: DC | PRN
Start: 2023-12-20 — End: 2023-12-21
  Administered 2023-12-20: 05:00:00 325 mg via ORAL

## 2023-12-20 MED ORDER — APIXABAN 2.5 MG TABLET
2.5 | ORAL_TABLET | Freq: Two times a day (BID) | ORAL | 4 refills | Status: AC
Start: 2023-12-20 — End: ?

## 2023-12-20 NOTE — Transfer Summaries
 [facility] 	  CCU Transfer Note  Date: 1/23/2025Hospital Day: 5  Synopsis: Charles [name] is an 83 year old male with a history of diabetes, prostate, gastric, and colonic cancer, tobacco use disorder, and BPH. He tested positive for COVID-19 one week ago and has since experienced a productive cough, generalized weakness, and lightheadedness upon standing. He also reports poor oral intake. He denies chest pain, falls, or loss of consciousness. In the ED, the patient presented with a blood pressure of 90/61, heart rate of 70, respiratory rate of 24, afebrile, and saturating 95% on room air. He was awake and alert without significant respiratory distress. While in the ED, his blood pressure dropped to the 80s/40s, and his blood glucose was noted to be 75. Following a cup of applesauce, the patient became unresponsive for a few seconds. Monitor readings revealed atrial fibrillation with conversion to atrial flutter after a sternal rub. On 1/19, the patient was noted to have atrial flutter with a slow ventricular response, with heart rates in the 30-50s and significant pauses, but without hemodynamic instability. He denied chest pain, shortness of breath, palpitations, or lightheadedness, and a heparin infusion was started. On 1/20, the heparin infusion was continued, and the patient?s heart rate improved to the 50-60s while remaining in atrial flutter. An echocardiogram revealed no structural abnormalities, with an ejection fraction of 59%.Overnight from 01/20-01/21 the patient experienced atrial flutter with pauses and bradycardia, with heart rates in the 50-70s. He remained asymptomatic. Pacemaker procedure was pushed to tomorrow (01/22).12/19/23: Pacemaker placed without issue. Ablation refused.12/20/23: No pauses seen overnight. Patient stable and asymptomatic at this time. CXR looks clear. Patient is now fit for transfer to the floor.Data: Vitals: Last 24 hours: Temp:  [97.6 ?F (36.4 ?C)-98.7 ?F (37.1 ?C)] 98.7 ?F (37.1 ?C)Pulse:  [71-84] 73Resp:  [14-22] 17BP: (130-161)/(69-93) 161/81SpO2:  [88 %-100 %] 95 %Exam:General: HEENT: NCAT, pupils ERRLA. No scleral icterus. Oral mucosa moistNeck: Normal ROM, supple, no JVD, no LADCV: Regular rate and rhythm, no murmurs/rubs/gallops appreciated, normal S1/S2Pulm: Clear to auscultation bilaterally, no wheeze rales or rhonchi heard, good air entryGI: Abdomen soft, non-distended, non-tender, bowel sounds presentExtremities: Warm. Palpable radial, DT, and PT pulses. No LE edemaSkin: Dry, intact, no breakdown bruises or rash. Gauze were pacemaker was placed.Neuro: Alert and oriented x 3, non-focal neuro examAssessment/Plan: THINGS TO FOLLOW:Monitor post pacemaker placement on telemetryFollow up with cardiology outpatientUS Duplex carotid bilateral - prior stroke plus carotid stent place. ?follow upPLEASE SEE PROGRESS NOTES FOR FURTHER DETAILSTRANSFER TO: MEDICAL FLOOR/TELEMETRY/STEP DOWNCase and transfer plan discussed with Hospitalist Team/Teamcare ResidentFull CodeElectronically Signed by: Eustace Moore, MD Internal Medicine Resident PGY-2[facility]1/23/2025 I saw and evaluated this patient. I obtained the HPI, performed the physical exam and formulated the above assessment and plan for this patient. I discussed the case with the cardiology resident/fellow and we agree with the findings and plan as documented. Additionally:Patient is feeling well after placement of RV pacer. Device functioning well, no e/o further pauses. Remains in flutter. Will consider flutter ablation as outpatient. CXR clear this morning. OK for d/c. Will start eliquis (low dose) for atrial flutter tomorrow per Dr. Netty Starring instructions. Maryella Shivers, MD, Orlene Erm, MD, FACCAttending PhysicianPriMed Cardiology / [facility] / [facility] Signed by Norris Cross, MD, December 20, 2023

## 2023-12-20 NOTE — Significant Event
 Patient did not get transferred to the floor and instead will be discharged home.Electronically Signed by Jeronimo Greaves, MD, December 20, 2023 4:18 PM

## 2023-12-20 NOTE — Plan of Care
 Plan of Care Overview/ Patient Homar Weinkauf				Location: JYNW/2956-O13 y.o., male				Attending: Cherre Robins, *	Admit Date: 12/15/2023			[id number] LOS: 5 days Past Medical History: Diagnosis Date  Cancer (HC Code) 12/2013  They took all of my stomach out  Enlarged prostate   Melanoma (HC Code)   Stomach cancer (HC Code)  Past Surgical History: Procedure Laterality Date  ABDOMINAL SURGERY    GASTRECTOMY    INGUINAL HERNIA REPAIR Bilateral   Medication:SCHEDULED: Current Facility-Administered Medications Medication Dose Route Frequency Provider Last Rate Last Admin  ethyl alcohol 62 % nasal swab 1 Application  1 Application Nasal Q12H Kerby Moors, Devra Dopp, MD   1 Application at 12/20/23 6295  famotidine (PEPCID) tablet 20 mg  20 mg Oral Daily Kerby Moors, Devra Dopp, MD   20 mg at 12/20/23 2841  insulin lispro (Admelog, HumaLOG) Correction Scale 1-18 Units  1-18 Units Subcutaneous AC & HS Bernita Raisin, MD   1 Units at 12/18/23 1808  minocycline (MINOCIN,DYNACIN) capsule 100 mg  100 mg Oral Q12H Renda Rolls, MD   100 mg at 12/20/23 3244  polyethylene glycol (MIRALAX) packet 17 g  17 g Oral Daily Kerby Moors, Devra Dopp, MD      sodium chloride 0.9 % flush 3 mL  3 mL IV Push Q8H Kerby Moors, Devra Dopp, MD   3 mL at 12/20/23 0521  tamsulosin (FLOMAX) 24 hr capsule 0.4 mg  0.4 mg Oral Nightly Bernita Raisin, MD   0.4 mg at 12/19/23 2037 Nutritional AssessmentTimepoint: ReassessmentReason For Assessment: per organizational policyIdentified At Risk by Screening Criteria: no indicators presentPatient Reported Diet/Restrictions/Preferences: consistent carbohydrate, heart healthyCurrent StateCurrent Appetite: fairSpecialty Diet/Nutrition Received: cardiacDiet/Feeding Assistance: noneDiet/Feeding Tolerance: fairNutrition Interventions: referred to dietitian, supplemental drinks providedNutrition Risk Screen: reduced oral intake over the last monthAnthropometric MeasurementsHeight: 6' 1 (185.4 cm) Weight: 59.6 kg Weight Method: Stated Weight Scale Used: Wheelchair  BMI (Calculated): 17.3Admit Weight: 59.6 kgWeight Change: 3% weight loss x 6 months, not clinically significant  Nutrition Focused Physical FindingsAdditional Documentation: Physical Appearance (Group)Overall Physical Appearance: loss of subcutaneous fat, loss of muscle mass, generalized wasting, underweightSkin: other (see comments) (no PI)Estimated Nutritional NeedsTotal Energy Estimated Needs: 1500-1800 kcal/dayMethod for Estimating Needs: 25-30 kcal/kgTotal Protein Estimated Needs: 72-120 g/dayMethod for Estimating Needs: 1.2-2 g/kgTotal Fluid Estimated Needs: 1500-1800 ml/dayMethod for Estimating Needs: 1 mL/kcalNutrition OrderNutrition Order: meets nutritional requirementsNutrition RiskDate of Last RD visit: 01/23/25Date of next RD visit: 01/28/25Nutrition Risk: patient at nutritional riskLevel of Risk: highNutrition DiagnosisNutrition Diagnosis/Problem: Severe MalnutritionRelated to: chronic illnessAs Evidenced by: severe muscle and fat wastingNutrition Recommendations referred to dietitian, supplemental drinks providedAssessment/PlanPatient assessed for nutrition follow up. NPO yesterday, now on Cardiac 3 gm Na diet. Fair/poor PO intake per discussion with RN, did not want breakfast this morning. Labs reviewed, WDL. No new weight since admission. RECOMMENDATIONS:1. Liberalize to Regular diet to help maximize PO intake2. Continue ensure plus supplement BID 3. Encourage/assist PO intake, provide food preferences4. Obtain updated weight to trend GOALS: 1. Patient will consume >75% of estimated daily nutrient needs.2. Patient will preserve/maintain lean body mass.MONITORING / EVALUATION:Will monitor: Nutrition status 	Weight, Diet Order, Appetite/PO	Lab/Glucose profile	Gastrointestinal	Skin	Medical team plan in relation to nutrition interventionElectronically Signed by: Elie Goody, MS, RD, CNSC

## 2023-12-20 NOTE — Plan of Care
 Plan of Care Overview/ Patient StatusAssumed care of pt at 1900.Pt Alert and Oriented x 4. Moves all extremities. Standby assist OOB. Afebrile. Brief Period of tachy 160s-200 upon start of shift. EKG done and CCU resident made aware. Pt stated he feels fine. Back to Aflutter 70s-90s after placing new tele leads. Palpable pulses. No edema. Cachectic appearing.Maintained on RA. No respiratory distress.Cardiac diet started. Abd flat nontender.Urinal at bedside. BM x 1.0500: Pt c/o pain and tenderness around Pacemaker site. Team notified. Tylenol given per orders. Baxter Hire, RN

## 2023-12-20 NOTE — Significant Event
 Transfer from resident [employer].

## 2023-12-21 ENCOUNTER — Encounter: Admit: 2023-12-21 | Payer: PRIVATE HEALTH INSURANCE

## 2023-12-21 NOTE — Other
 Operative Diagnosis:Pre-op:   Sy Patient Coded Diagnosis   None  Patient Diagnosis   Pre-op diagnosis: Sy  Post-op diagnosis:     * No Diagnosis Codes entered *Operative Procedure(s) :Procedure(s) (LRB):PACEMAKER IMPLANT (Left)Post-op Procedure & Diagnosis ConfirmationPost-op Diagnosis: Post-op Diagnosis updated (see notes)     - SSSPost-op Procedure: Post-op Procedure updated (see notes)     - Pacemaker implantAnesthesia ClarifiersCardiothoracic:  AICD/pacemaker skin pocket or generator creation/revision/placement and AICD system placement/revision WITH system testing

## 2023-12-21 NOTE — Anesthesia Post-Procedure Evaluation
 Anesthesia Post-op NotePatient: Charles MitchellProcedure(s):  Procedure(s) (LRB):PACEMAKER IMPLANT (Left) Last Vitals:  I have reviewed the post-operative vital signs as noted in the Epic chart.POSTOP EVALUATION:      Patient Recovery Location:  ICU     Vital Signs Status:  Stable     Respiratory Status:  Acceptable     Cardiovascular/Hydration Status:  StableThere were no known notable events for this encounter.

## 2023-12-22 ENCOUNTER — Encounter: Admit: 2023-12-22 | Payer: PRIVATE HEALTH INSURANCE

## 2023-12-22 NOTE — Discharge Summary
 [facility] SummaryPatient Data:  Patient Name: Charles [name] Admit date: 12/15/2023 Age: 83 y.o. Discharge date: 12/20/23 DOB: 10/22/1941	 Discharge Attending Physician: No att. providers found  MRN: [id number]	 Discharged Condition: good PCP: [facility] Disposition: Home  Principal Diagnosis: Acute pulmonary edema (HC Code) (HC CODE) (HC Code)Other Active Diagnoses: Present on Admission: Acute pulmonary edema (HC Code) (HC CODE) (HC Code) Arrhythmia PacemakerIssues to be Addressed Post Discharge: Issues to be Addressed Post Discharge:Close follow up with cardiology (they will arrange it)Follow up with PCPPending Labs and Tests: Follow-up Information:[facility] [facility] [place] [phone] up No future appointments.Hospital Course: Hospital Course: Charles [name] is an 83 year old male with a history of diabetes, prostate, gastric, and colonic cancer, tobacco use disorder, and BPH. He tested positive for COVID-19 one week ago and has since experienced a productive cough, generalized weakness, and lightheadedness upon standing. He also reports poor oral intake. He denies chest pain, falls, or loss of consciousness. In the ED, the patient presented with a blood pressure of 90/61, heart rate of 70, respiratory rate of 24, afebrile, and saturating 95% on room air. He was awake and alert without significant respiratory distress. While in the ED, his blood pressure dropped to the 80s/40s, and his blood glucose was noted to be 75. Following a cup of applesauce, the patient became unresponsive for a few seconds. Monitor readings revealed atrial fibrillation with conversion to atrial flutter after a sternal rub. On 1/19, the patient was noted to have atrial flutter with a slow ventricular response, with heart rates in the 30-50s and significant pauses, but without hemodynamic instability. He denied chest pain, shortness of breath, palpitations, or lightheadedness, and a heparin infusion was started. On 1/20, the heparin infusion was continued, and the patient?s heart rate improved to the 50-60s while remaining in atrial flutter. An echocardiogram revealed no structural abnormalities, with an ejection fraction of 59%. Overnight from 01/20-01/21 the patient experienced atrial flutter with pauses and bradycardia, with heart rates in the 50-70s. He remained asymptomatic. Pacemaker procedure was pushed to tomorrow (01/22). 12/19/23: Pacemaker placed without issue. Ablation refused. 12/20/23: No pauses seen overnight. Patient stable and asymptomatic at this time. CXR looks clear. Inpatient Consultants and summary of recommendations:Cardiology - pacemaker placedPertinent Procedures or Surgeries: Procedure(s) (LRB):PACEMAKER IMPLANT (Left)Pertinent lab findings and test results: Objective: Recent Labs Lab 01/21/250610 01/22/250354 01/23/250452 WBC 5.3 5.5 8.0 HGB 8.7* 9.4* 10.6* HCT 26.60* 29.30* 32.10* PLT 272 307 286  Recent Labs Lab 01/21/250610 01/22/250354 01/23/250452 NEUTROPHILS 57.5 54.0 74.4*  Recent Labs Lab 01/21/250610 01/21/250714 01/22/250354 01/22/250734 01/23/250452 01/23/250729 01/23/251123 NA 143  --  144  --  143  --   --  K 3.9   < > 4.1  --  4.2  --   --  CL 113*  --  110*  --  106  --   --  CO2 22  --  26  --  24  --   --  BUN 20  --  21  --  19  --   --  CREATININE 0.97  --  1.02  --  0.96  --   --  GLU 83   < > 99   < > 90   < > 114* ANIONGAP 8  --  8  --  13  --   --   < > = values in this interval not displayed.  Recent Labs Lab 01/21/250610 01/21/251815 01/22/250354 01/23/250452 CALCIUM 7.4*  --  8.7* 9.5 MG  1.7   < > 2.0 1.8 PHOS 2.0*  --  2.5 3.1  < > = values in this interval not displayed.  Recent Labs Lab 01/18/251435 ALT 13 AST 20 ALKPHOS 113 BILITOT 0.2  Recent Labs Lab 01/18/251435 01/19/251152 01/21/250610 01/21/251815 01/22/250354 PTT  --    < > 64.3* 97.2* 40.9* LABPROT 12.3*  --   --   --   --  INR 1.18*  --   --   --   --   < > = values in this interval not displayed.  Culture Information:No results for input(s): LABBLOO, LABURIN, LOWERRESPIRA in the last 168 hours.Imaging: Imaging results last 24h: XR Chest PA or APResult Date: 12/20/2023 No pneumothorax. Small left pleural effusion and bibasilar atelectatic changes. [facility] Radiology Notify System Classification: Routine. Report initiated by: Armando Gang, MD Reported and signed by: Leilani Merl, MD  Diet:  Heart healthy dietMobility: Highest Level of mobility - ACTUAL: Mobility Level 6, Walk 10+ steps, AM PAC 18-21PT Disposition Recommendation:   Physical Exam Discharge vitals: Temp:  [97.6 ?F (36.4 ?C)-98.7 ?F (37.1 ?C)] 98.7 ?F (37.1 ?C)Pulse:  [71-84] 75Resp:  [12-22] 20BP: (111-161)/(66-93) 111/66SpO2:  [88 %-100 %] 95 %Device (Oxygen Therapy): room airO2 Flow (L/min):  [2] 2 Cognitive Status at Discharge: BaselineDischarge Physical Exam:Physical ExamConstitutional:     Appearance: Normal appearance. Cardiovascular:    Rate and Rhythm: Normal rate and regular rhythm.    Pulses: Normal pulses.    Heart sounds: Normal heart sounds. Pulmonary:    Effort: Pulmonary effort is normal.    Breath sounds: Normal breath sounds. Abdominal:    General: Abdomen is flat.    Palpations: Abdomen is soft. Musculoskeletal:    Comments: Gauze present over upper left of chest Skin:   General: Skin is warm. Neurological:    Mental Status: He is alert and oriented to person, place, and time. Allergies Allergies Allergen Reactions  Aspirin Other (See Comments)   Patient states he does not have this alergy  PMH PSH Past Medical History: Diagnosis Date  Cancer (HC Code) 12/2013  They took all of my stomach out  Enlarged prostate   Melanoma (HC Code)   Stomach cancer (HC Code)   Past Surgical History: Procedure Laterality Date  ABDOMINAL SURGERY    GASTRECTOMY    INGUINAL HERNIA REPAIR Bilateral   Social History Family History Social History Tobacco Use  Smoking status: Former  Smokeless tobacco: Not on file Substance Use Topics  Alcohol use: Yes   Comment: rarely  No family history on file. Discharge Medications: Discharge: Current Discharge Medication List  START taking these medications  Details apixaban (ELIQUIS) 2.5 mg tablet Take 1 tablet (2.5 mg total) by mouth 2 (two) times daily.Qty: 30 tablet, Refills: 3Start date: 12/21/2023  minocycline (MINOCIN,DYNACIN) 100 mg capsule Take 1 capsule (100 mg total) by mouth 2 (two) times daily for 7 days.Qty: 14 capsule, Refills: 0Start date: 12/20/2023, End date: 12/27/2023   CONTINUE these medications which have NOT CHANGED  Details aspirin 81 mg EC delayed release tablet Take 1 tablet (81 mg total) by mouth daily.  atorvastatin (LIPITOR) 80 mg tablet Take 0.5 tablets (40 mg total) by mouth nightly.  METFORMIN HCL (METFORMIN ORAL) Take by mouth.  TAMSULOSIN HCL (TAMSULOSIN ORAL) Take by mouth.   Jeronimo Greaves, MDInternal MedicinePGY-2[facility]1/23/2025 at 4:12 PM

## 2023-12-22 NOTE — Progress Notes
 [facility] Post Discharge Outreach: Transition of Care NoteRisk of Unplanned Readmission: 13.41%PERTINENT INFORMATION:     [facility] Post Discharge Outreach spoke with: [facility]Discharging [facility]: [facility]    Discharge location: HomeHOSPITALIZATION:83 year old male with a history of diabetes, prostate, gastric, and colonic cancer, tobacco use disorder, and BPH. He tested positive for COVID-19 one week ago and has since experienced a productive cough, generalized weakness, and lightheadedness upon standing. CURRENT STATE:Since discharge patient reports feeling: Better Patient cared for by: Family  Reports no longer feeling lightheaded, went to the barber today with son-in-law. Tolerating po. Reports he is writing a book about his life.  MEDICATION CHANGES:Validated NEW medications to take: YesValidated Changed medications to take: N/AValidated Stopped medications to NOT take: N/AIssues obtaining prescriptions: N/A FOLLOW-UP APPOINTMENTS and TRANSPORTATION:Patient aware of scheduled appointments: YesAwareness and assistance with appointments needing to be scheduled: NoTransportation concerns for follow-up appointment: NoDME and HOME HEALTH SERVICES:Durable medical equipment received: N/AContact has been made with home care agency: N/APlan established for follow up labs/tests: N/A

## 2023-12-25 ENCOUNTER — Encounter: Admit: 2023-12-25 | Payer: PRIVATE HEALTH INSURANCE | Attending: Cardiovascular Disease

## 2023-12-25 ENCOUNTER — Encounter: Admit: 2023-12-25 | Payer: PRIVATE HEALTH INSURANCE

## 2023-12-25 DIAGNOSIS — Z950 Presence of cardiac pacemaker: Secondary | ICD-10-CM

## 2023-12-27 ENCOUNTER — Encounter: Admit: 2023-12-27 | Payer: PRIVATE HEALTH INSURANCE

## 2024-01-02 ENCOUNTER — Telehealth: Admit: 2024-01-02 | Payer: PRIVATE HEALTH INSURANCE | Attending: Cardiovascular Disease

## 2024-01-03 ENCOUNTER — Telehealth: Admit: 2024-01-03 | Payer: PRIVATE HEALTH INSURANCE | Attending: Cardiovascular Disease

## 2024-01-04 ENCOUNTER — Encounter: Admit: 2024-01-04 | Payer: Medicare (Managed Care) | Attending: Cardiovascular Disease

## 2024-01-04 DIAGNOSIS — Z45018 Encounter for adjustment and management of other part of cardiac pacemaker: Secondary | ICD-10-CM

## 2024-02-16 ENCOUNTER — Inpatient Hospital Stay: Admit: 2024-02-16 | Discharge: 2024-02-16 | Payer: PRIVATE HEALTH INSURANCE | Attending: Emergency Medicine

## 2024-02-16 ENCOUNTER — Emergency Department: Admit: 2024-02-16 | Payer: PRIVATE HEALTH INSURANCE

## 2024-02-16 DIAGNOSIS — J90 Pleural effusion, not elsewhere classified: Secondary | ICD-10-CM

## 2024-02-16 DIAGNOSIS — S60417A Abrasion of left little finger, initial encounter: Secondary | ICD-10-CM

## 2024-02-16 DIAGNOSIS — M79642 Pain in left hand: Secondary | ICD-10-CM

## 2024-02-16 DIAGNOSIS — Z886 Allergy status to analgesic agent status: Secondary | ICD-10-CM

## 2024-02-16 DIAGNOSIS — Z7901 Long term (current) use of anticoagulants: Secondary | ICD-10-CM

## 2024-02-16 DIAGNOSIS — Z950 Presence of cardiac pacemaker: Secondary | ICD-10-CM

## 2024-02-16 DIAGNOSIS — S61412A Laceration without foreign body of left hand, initial encounter: Secondary | ICD-10-CM

## 2024-02-16 DIAGNOSIS — S59902A Unspecified injury of left elbow, initial encounter: Secondary | ICD-10-CM

## 2024-02-16 DIAGNOSIS — S51012A Laceration without foreign body of left elbow, initial encounter: Secondary | ICD-10-CM

## 2024-02-16 MED ORDER — LIDOCAINE (PF) 10 MG/ML (1 %) INJECTION SOLUTION
101 | Freq: Once | INTRAMUSCULAR | Status: CP
Start: 2024-02-16 — End: ?
  Administered 2024-02-16: 16:00:00 101 mL via INTRAMUSCULAR

## 2024-02-16 MED ORDER — ACETAMINOPHEN 325 MG TABLET
325 | Freq: Once | ORAL | Status: CP
Start: 2024-02-16 — End: ?
  Administered 2024-02-16: 14:00:00 325 mg via ORAL

## 2024-02-16 NOTE — ED Provider Notes
 Chief Complaint Patient presents with  Charles [name]  at home  hit l elbow  l hand  pain on eliquis  HPI/PE:--------------------------------------- Resident Note----------------------------------------------SynopsisArthur [name] is a 83 y.o. 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  He presents to the emergency department after a mechanical trip and fall onto tile.  He says that he was going to get a drink when he lost his footing and landed on his left elbow, striking tile.  He was able to get up afterwards.  No head strike.  Aside from the left elbow he complains of no pain.  He explicitly denies any dizziness, headache, blurry vision, double vision, nausea, vomiting, difficulty breathing.Pertinent Physical Exam:Temperature: 97.3 ?F (36.3 ?C), HR: 68, RR: 18, BP: 126/75, O2: 98 %Constitutional:  Well-appearing, no acute distress.HEENT:  Trachea midline, [name] is supple.  Cervical midline and paraspinal muscles are without tenderness or step-offs.CV:  Regular rate and rhythmPulm:  Clear to auscultation bilaterally, no increased work of breathing, no splintingMSK:  Left elbow with 2 x 1cm long lacerations about 1-2 mm deep.  Range of motion is full with mild pain with arom. Small skin tear, hemostatic, to left dorsum hand. Pelvis stable, legs equal in length without int/ext rotation; clavicles stable.Neuro:  A&O x4, follows commands PERRLA.  EOMIPsych:  Pleasant, cooperative, answering questions appropriately.Physical exam normal unless otherwise stated above.MDM/Plan:Based on the information available, my concerns include: mechanical trip + fall on Eliquis (for pacemaker), no head strike.  Charles [name], x-ray left hand left elbow, washout of wound, repair.ED Course: Charles [name] is a 83 y.o. male with a fall and laceration to the left elbow.  Upon presentation the emergency department, he was well-appearing and in no acute distress.  Vital signs were within normal limits, afebrile.  Physical examination notable for 2 small skin tears to the left elbow, 1 small skin tear to left hand.  No exposed bone, 1 of his elbow lacerations necessitated repair with 1 5 0 nylon suture.  Charles [name] negative but did show bilateral pleural effusions.  [facility].  Oxygen saturation 99% on room air, ambulatory without difficulty.[name]:.Attending Provider: Phineas Semen, MD-------------------------------------------------------------------------------------------------------- Charles [name] Medicine, PGY-1Available on Mobile Heartbeat  Physical ExamED Triage Vitals [02/16/24 1417]BP: 126/75Pulse: 68Pulse from  O2 sat: n/aResp: 18Temp: 97.3 ?F (36.3 ?C)Temp src: OralSpO2: 98 % BP 127/81  - Pulse (!) 50  - Temp 97.2 ?F (36.2 ?C) (Oral)  - Resp 16  - Wt 63.5 kg (140 lb)  - SpO2 99%  - BMI 18.47 kg/m? Physical ExamConstitutional:     General: He is not in acute distress.   Appearance: He is not toxic-appearing. HENT:    Head: Normocephalic and atraumatic. Cardiovascular:    Rate and Rhythm: Normal rate.    Heart sounds: No murmur heard.Pulmonary:    Effort: Pulmonary effort is normal. No respiratory distress.    Breath sounds: No wheezing or rales. Chest:    Chest wall: No tenderness. Abdominal:    Tenderness: There is no abdominal tenderness. Musculoskeletal:       General: No deformity.    Cervical back: [name] supple. Skin:   General: Skin is warm and dry. Neurological:    Mental Status: He is alert and oriented to person, place, and time.  ProceduresAttestation/Critical Care-------------------------------------------------------------------------------------------------------------------------------------------------------------------------------------------------------------------------------------------------------------------------------Attending Supervised: ResidentI saw and  examined the patient. I agree with the findings and plan of care as documented in the resident's note, with the following noted exceptions and additions.83 y.o. with hx including stomach cancer, melanoma, diabetes, atrial flutter on apixaban presents after a trip and fall.No prodrome, no LOC, no head strike.Struck left elbow against the flat ground.Denies any other injuries.Review of Systems Constitutional:  Negative for fever. Respiratory:  Negative for shortness of breath.  Cardiovascular:  Negative for chest pain. Gastrointestinal:  Negative for abdominal pain. Musculoskeletal:  Negative for back pain and [name] pain. Neurological:  Negative for headaches. On exam, well-appearing, NAD.  VS WNL.  Head atraumatic.Superficial abrasion over the base of the left pinky.  No bony tenderness there.Superficial abrasion over the elbow as well as 5-42mm laceration to the posterior of the elbow without exposed bone.No other bony tenderness or signs of trauma from head to toe.DDx considered includes: Mechanical fall more likely than syncopeConsider elbow fractureLow suspicion for intracranial injury, spinal injury, thoracic or abdominal injuryLow suspicion for traumatic arthrotomyPlan: XR left hand, XR elbow, and [place] head and [name] were ordered from triageIn my clinical judgment, labs, additional imaging, cross-sectional imaging of the left elbow, ortho consult, not indicated at this time.** decisions regarding IV and p.o. fluids made in the context of the ongoing critical IV fluid shortage following Angela Nevin **Course: XR negativeCT: 1. No evidence of acute intracranial abnormality.  2. No evidence for acute cervical spine fracture or traumatic subluxation.3. Moderate to large bilateral pleural effusions. Discussed results with the patient and he has PMD follow up in 4 days and will discuss the pleural effusions with themLaceration repaired by ED residentStable for discharge homeReturn precautions d/w patient who voiced understanding/agreement w/planZev Jed Limerick, MDEmergency Medicine-------------------------------------------------------------------------------------------------------------------------------------------------------------------------------------------------------------------------------------------------------------------------------Clinical Impressions as of 02/16/24 1759 Fall, initial encounter Laceration of left elbow, initial encounter  ED DispositionDischarge  Blase Mess, DOResident03/22/25 1756 Phineas Semen, MD03/22/25 1759

## 2024-02-16 NOTE — ED Procedure Note
 Charles [name] is a 83 y.o. male patient.  SNOMED [place](R) 1. Fall, initial encounter  FALL 2. Laceration of left elbow, initial encounter  LACERATION OF LEFT ELBOW Past Medical History: Diagnosis Date  Cancer (HC Code) 12/2013  They took all of my stomach out  Enlarged prostate   Melanoma (HC Code)   Stomach cancer (HC Code)  Blood pressure 127/81, pulse (!) 50, temperature 97.2 ?F (36.2 ?C), temperature source Oral, resp. rate 16, weight 63.5 kg (140 lb), SpO2 99%.Lac RepairPerformed by: Blase Mess, DOAuthorized by: Phineas Semen, MD  Time out documented by nurse: noTime out unable to be performed due to emergent nature of procedure:  NoTiming: the time out is initiated prior to the beginning of the procedure:  YesName of patient and medical record or DOB stated and matches ID band or previously confirmed medical record number.:  YesProceduralist states or confirms the procedure to be performed:  YesThe procedural consent is used to verify the procedure to be performed and it matches the patient identifiers:  N/ASite of procedure(s) (with laterality or level) is topically marked per policy and visible after draping:  N/A(IF SITE NOT MARKED) Radiographic imaging is present or a laterality side/site band is present on patient and is accessible:  N/AAnesthesia:   Anesthesia method:  Local infiltration  Local anesthetic:  Lidocaine 1% w/o epiLaceration details:   Location:  Shoulder/arm  Shoulder/arm location:  L elbow  Length (cm):  1.5  Depth (mm):  2Repair type:   Repair type:  SimpleTreatment:   Amount of cleaning:  Standard  Irrigation solution:  Sterile saline  Irrigation volume:  200cc  Irrigation method:  Pressure wash and syringeSkin repair:   Repair method:  Sutures  Suture size:  5-0  Suture material:  Nylon  Number of sutures:  1Approximation:   Approximation:  Close  [place] border: well-aligned Post-procedure details:   Dressing:  Non-adherent dressing.  Patient tolerance of procedure:  Tolerated well, no immediate complications.[address], DO3/22/2025 Blase Mess, DOResident03/22/25 [address] DOResident03/22/25 [phone]

## 2024-02-16 NOTE — Discharge Instructions
 Discharge Instructions for Charles [name] on 02/16/2024.You were seen in the emergency department and evaluated for a fall while on Eliquis.While you are in the emergency department, we did a [place] scan of your head which did not show any acute bleed or break in the bone.  Just because this was negative today, does not mean bleed can not develop after you leave the hospital.  Please monitor for nausea, vomiting, dizziness, vision changes, severe worsening headache, seizures, or passing out.  In the event of these, return to the emergency department.Please follow up with your primary care doctor at your scheduled appointment in four days. Also noted on your [place] was large bilateral pleural effusions, or fluid around both of your lungs. Please advise your doctor of this to be followed up.You also received one stitch to your left elbow. See your primary medical doctor or any clinic in 7-10 days for wound followup and stitch removal.

## 2024-02-16 NOTE — ED Notes
 3:14 PM pt coming from waiting room with c/o of fall. Pt states he last took his eliquis this morning. No headstrike, -loc. Lac noted on L elbow. Pt states he lost his footing and fell to the floor. Rates pain at 8/10. PT has pacemaker. Denies headache, neck pain, n/v/d, fever, dizziness. Waiting to go to [place]. Resident Brenk at bedside for eval. 4:00 PM MD Balsen at bedside.4:11 PM pt to [place].4:22 PM pt back from [place]. Pending results. Resident Brenk at bedside for lac repair. Pending discharge plan5:00 PM Provider discharged the patient at this time. Discharge instructions on use, review of prescriptions, side effects, signs and symptoms to monitor and follow-up instructions was performed by the provider. Patient ambulated with cane without questions or concerns at this time. Maura Crandall, RN

## 2024-02-18 ENCOUNTER — Inpatient Hospital Stay: Admit: 2024-02-18 | Discharge: 2024-02-18 | Payer: PRIVATE HEALTH INSURANCE | Attending: Emergency Medicine

## 2024-02-18 ENCOUNTER — Emergency Department: Admit: 2024-02-18 | Payer: PRIVATE HEALTH INSURANCE

## 2024-02-18 DIAGNOSIS — Z7901 Long term (current) use of anticoagulants: Secondary | ICD-10-CM

## 2024-02-18 DIAGNOSIS — L089 Local infection of the skin and subcutaneous tissue, unspecified: Secondary | ICD-10-CM

## 2024-02-18 DIAGNOSIS — Z85038 Personal history of other malignant neoplasm of large intestine: Secondary | ICD-10-CM

## 2024-02-18 DIAGNOSIS — Z950 Presence of cardiac pacemaker: Secondary | ICD-10-CM

## 2024-02-18 DIAGNOSIS — R002 Palpitations: Secondary | ICD-10-CM

## 2024-02-18 DIAGNOSIS — Z903 Acquired absence of stomach [part of]: Secondary | ICD-10-CM

## 2024-02-18 DIAGNOSIS — T148XXA Other injury of unspecified body region, initial encounter: Secondary | ICD-10-CM

## 2024-02-18 DIAGNOSIS — Z85820 Personal history of malignant melanoma of skin: Secondary | ICD-10-CM

## 2024-02-18 DIAGNOSIS — R2232 Localized swelling, mass and lump, left upper limb: Secondary | ICD-10-CM

## 2024-02-18 LAB — CBC WITH AUTO DIFFERENTIAL
BKR WAM ABSOLUTE IMMATURE GRANULOCYTES.: 0.01 x 1000/ÂµL (ref 0.00–0.30)
BKR WAM ABSOLUTE LYMPHOCYTE COUNT.: 1.55 x 1000/ÂµL (ref 0.60–3.70)
BKR WAM ABSOLUTE NRBC (2 DEC): 0 x 1000/ÂµL (ref 0.00–1.00)
BKR WAM ANC (ABSOLUTE NEUTROPHIL COUNT): 2.55 x 1000/ÂµL (ref 2.00–7.60)
BKR WAM BASOPHIL ABSOLUTE COUNT.: 0.02 x 1000/ÂµL (ref 0.00–1.00)
BKR WAM BASOPHILS: 0.4 % (ref 0.0–1.4)
BKR WAM EOSINOPHIL ABSOLUTE COUNT.: 0.03 x 1000/ÂµL (ref 0.00–1.00)
BKR WAM EOSINOPHILS: 0.6 % (ref 0.0–5.0)
BKR WAM HEMATOCRIT (2 DEC): 38.9 % (ref 38.50–50.00)
BKR WAM HEMOGLOBIN: 12.7 g/dL — ABNORMAL LOW (ref 13.2–17.1)
BKR WAM IMMATURE GRANULOCYTES: 0.2 % (ref 0.0–1.0)
BKR WAM LYMPHOCYTES: 32.9 % (ref 17.0–50.0)
BKR WAM MCH (PG): 32.1 pg (ref 27.0–33.0)
BKR WAM MCHC: 32.6 g/dL (ref 31.0–36.0)
BKR WAM MCV: 98.2 fL (ref 80.0–100.0)
BKR WAM MONOCYTE ABSOLUTE COUNT.: 0.55 x 1000/ÂµL (ref 0.00–1.00)
BKR WAM MONOCYTES: 11.7 % (ref 4.0–12.0)
BKR WAM MPV: 11 fL (ref 8.0–12.0)
BKR WAM NEUTROPHILS: 54.2 % (ref 39.0–72.0)
BKR WAM NUCLEATED RED BLOOD CELLS: 0 % (ref 0.0–1.0)
BKR WAM PLATELETS: 139 x1000/ÂµL — ABNORMAL LOW (ref 150–420)
BKR WAM RDW-CV: 17.6 % — ABNORMAL HIGH (ref 11.0–15.0)
BKR WAM RED BLOOD CELL COUNT.: 3.96 M/ÂµL — ABNORMAL LOW (ref 4.00–6.00)
BKR WAM WHITE BLOOD CELL COUNT: 4.7 x1000/ÂµL (ref 4.0–11.0)

## 2024-02-18 LAB — COMPREHENSIVE METABOLIC PANEL
BKR A/G RATIO: 0.8 — ABNORMAL LOW (ref 1.0–2.2)
BKR ALANINE AMINOTRANSFERASE (ALT): 15 U/L (ref 9–59)
BKR ALBUMIN: 3 g/dL — ABNORMAL LOW (ref 3.6–5.1)
BKR ALKALINE PHOSPHATASE: 129 U/L — ABNORMAL HIGH (ref 9–122)
BKR ANION GAP: 6 — ABNORMAL LOW (ref 7–17)
BKR ASPARTATE AMINOTRANSFERASE (AST): 24 U/L (ref 10–35)
BKR AST/ALT RATIO: 1.6
BKR BILIRUBIN TOTAL: 0.4 mg/dL (ref <=1.2)
BKR BLOOD UREA NITROGEN: 13 mg/dL (ref 8–23)
BKR BUN / CREAT RATIO: 11.1 (ref 8.0–23.0)
BKR CALCIUM: 9.2 mg/dL (ref 8.8–10.2)
BKR CHLORIDE: 108 mmol/L — ABNORMAL HIGH (ref 98–107)
BKR CO2: 27 mmol/L (ref 20–30)
BKR CREATININE DELTA: 0.21
BKR CREATININE: 1.17 mg/dL (ref 0.40–1.30)
BKR EGFR, CREATININE (CKD-EPI 2021): >60 mL/min/{1.73_m2} (ref >=60)
BKR GLOBULIN: 4 g/dL — ABNORMAL HIGH (ref 2.0–3.9)
BKR GLUCOSE: 81 mg/dL (ref 70–100)
BKR POTASSIUM: 4.5 mmol/L (ref 3.3–5.3)
BKR PROTEIN TOTAL: 7 g/dL (ref 5.9–8.3)
BKR SODIUM: 141 mmol/L (ref 136–144)

## 2024-02-18 LAB — SEDIMENTATION RATE (ESR): BKR SEDIMENTATION RATE, ERYTHROCYTE: 43 mm/h — ABNORMAL HIGH (ref 0–20)

## 2024-02-18 LAB — C-REACTIVE PROTEIN     (CRP): BKR C-REACTIVE PROTEIN, HIGH SENSITIVITY: 17.6 mg/L — ABNORMAL HIGH

## 2024-02-18 LAB — LACTIC ACID, PLASMA (REFLEX 2H REPEAT): BKR LACTATE: 1.2 mmol/L (ref 0.5–2.2)

## 2024-02-18 MED ORDER — LIDOCAINE 4 % TOPICAL CREAM
4 | Freq: Once | TOPICAL | Status: CP
Start: 2024-02-18 — End: ?
  Administered 2024-02-18: 13:00:00 4 % via TOPICAL

## 2024-02-18 MED ORDER — CEPHALEXIN 500 MG CAPSULE
500 | ORAL_CAPSULE | Freq: Four times a day (QID) | ORAL | 1 refills | Status: AC
Start: 2024-02-18 — End: ?

## 2024-02-18 MED ORDER — CEFTRIAXONE IV PUSH 2000 MG VIAL & NS (ADULTS)
Freq: Once | INTRAVENOUS | Status: CP
Start: 2024-02-18 — End: ?
  Administered 2024-02-18: 13:00:00 20.000 mL via INTRAVENOUS

## 2024-02-18 MED ORDER — MUPIROCIN 2 % TOPICAL OINTMENT
2 | Freq: Three times a day (TID) | TOPICAL | 1 refills | Status: AC
Start: 2024-02-18 — End: ?

## 2024-02-18 MED ORDER — ACETAMINOPHEN 500 MG TABLET
500 | Freq: Once | ORAL | Status: CP
Start: 2024-02-18 — End: ?
  Administered 2024-02-18: 14:00:00 500 mg via ORAL

## 2024-02-18 MED ORDER — ACETAMINOPHEN 325 MG TABLET
325 | ORAL_TABLET | Freq: Four times a day (QID) | ORAL | 1 refills | Status: AC | PRN
Start: 2024-02-18 — End: ?

## 2024-02-18 NOTE — ED Triage Note
 I evaluated this patient as the Physician in triage and completed a medical screening examination.  Patient will need further care.  Initial care orders will be placed as appropriate.Desjuan Stearns, DO10:32 AM

## 2024-02-18 NOTE — Discharge Instructions
==============================================================================   THANK YOU FOR CHOOSING THE [place] EMERGENCY DEPARTMENT. IT WAS OUR HONOR TO CARE FOR YOU! Aijah Lattner D. Denia Mcvicar, M.D., F.A.C.E.P. A.B.E.M. Board Certified Emergency Medicine Physician Assistant Professor, [employer] ============================================================================== WE WOULD LIKE TO SHARE GREAT NEWS! YOU DO NOT APPEAR TO HAVE A MEDICAL EMERGENCY AT THIS MOMENT and DO NOT HAVE TO BE ADMITTED TO THE HOSPITAL BUT THAT DOES NOT MEAN THERE IS NOTHING WRONG. IN THE E.D., WE HAVE TRIED TO HELP YOU FEEL BETTER BY ALLEVIATING YOUR SYMPTOMS BUT YOUR HEALTH AND MEDICAL CARE ARE LIFELONG AND CONTINUOUS, SO PLEASE FOLLOW THE INSTRUCTIONS BELOW. ============================================================================== PLEASE KEEP IN MIND THAT VISITS TO THE EMERGENCY DEPARTMENT ARE VERY LIMITED EVALUATIONS OF A SNAPSHOT OF YOUR LIFE (ONE MOMENT IN TIME) AND BODY AND WE ARE HAPPY TO SEE YOU AT ANY TIME. RETURN TO OUR EMERGENCY DEPARTMENT IMMEDIATELY IF YOU HAVE A RETURN OF YOUR SYMPTOMS OR ANY NEW SYMPTOMS THAT ARE CONCERNING TO YOU; THESE MAY INCLUDE: FALLING/FEELING OFF BALANCE, FEVER, HEADACHE, ABDOMINAL PAIN, NAUSEA/VOMITING, INABILITY TO KEEP DOWN FOOD or WATER or MEDICATIONS, CHEST PAIN, DIFFICULTY BREATHING, DIFFICULTY URINATING or MOVING YOUR BOWELS, WEAKNESS, NUMBNESS or TINGLING, CHANGES IN YOUR HEARING or VISION, ANY PAIN YOU CAN NOT MANAGE AT HOME, LOSS OF CONSCIOUSNESS, FEELINGS or THOUGHTS OF HURTING YOURSELF or OTHERS. ============================================================================== CALL YOUR PRIMARY CARE PHYSICIAN IMMEDIATELY FOR A FOLLOW UP APPOINTMENT, AS WELL AS ANY SPECIALISTS INDICATED ON THIS PAPERWORK. EVERYONE NEEDS A PRIMARY CARE PHYSICIAN; SOME OPTIONS LOCALLY ARE [facility] at [phone]; [facility] at [phone]; [facility] at [phone]; [facility] at [phone]; [facility] at [phone]; [facility] ([facility] at [phone]; [facility] at [phone].============================================================================== PLEASE NOTE THAT YOUR SIGNATURE ON THIS PAPERWORK INDICATES: 1. YOU HAVE RECEIVED YOUR COPY OF YOUR DISCHARGE INSTRUCTIONS. 2. YOU HAVE REVIEWED THEM YOURSELF AND/OR YOU HAVE HAD THEM READ TO YOU BY THE HOSPITAL STAFF AND YOU NOW UNDERSTAND WHAT IS WRITTEN. 3. YOU HAVE BEEN GIVEN THE OPPORTUNITY TO ASK QUESTIONS AND ALL OF YOUR QUESTIONS HAVE BEEN ANSWERED. ==============================================================================

## 2024-02-18 NOTE — ED Provider Notes
 Chief Complaint Patient presents with  Wound Re-evaluation   Pt had suture placed 3/22 in left elbow s/p trip/fall onto tile. Pt is on eliquis  secondary to bradycardia, colonic cancer status post resection.  Pr's left arm noted slightly swollen and left hand noted slightly swollen and reddened.       Medical records reviewed.  The patient has a history of gastrectomy, melanoma, stomach cancer.  The last chart I see in our system is from an ED visit on 02/16/2024 for a fall.  I note that he has a pacemaker, on Eliquis, also states he has a history of colon cancer.Patient's son says that he changed the bandage for him yesterday and it did stick to the wound a bit and it was bleeding, but it is not now.  He is concerned that his dad's arm is red and hot swollen and that it might be infected.  The patient says that he has not seen any drainage from the wound.  He has no nausea or vomiting or diarrhea or constipation or chest pain or palpitations or shortness of breath or fevers or chills or abdominal pain or URI symptoms.  He is able to feel and move his hand and arm but it is painful at his elbow to do so.MDM:DDx: articular surface injury, contusion, fracture, laceration, neurovascular injury, sprain, tendon injury, arterial occlusion, cellulitis, DVT, PVD, varicose veins, neurovascular injury, abrasion, cellulitis, absess, neuropathic or radicular pain, compartment syndrome, rash, ligament injury, effusionAn acute or life threatening problem was considered during this evaluation  A decision regarding hospitalization was made during this visit  External data reviewed: Labs, Radiology and Notes (OSH or non-ED)History from independent historian: Relative (And the patient).Care limited by SDOH: Ambulatory Care Access.Independent interpretation of: XRay  Physical ExamED Triage Vitals [02/18/24 1023]BP: (!) 143/86Pulse: 64Pulse from  O2 sat: n/aResp: 20Temp: 97 ?F (36.1 ?C)Temp src: TemporalSpO2: 100 % BP (!) 143/86  - Pulse 64  - Temp 97 ?F (36.1 ?C) (Temporal)  - Resp 20  - Wt 63.5 kg (139 lb 15.9 oz)  - SpO2 100%  - BMI 18.47 kg/m? Physical ExamVitals and nursing note reviewed. Constitutional:     General: He is not in acute distress.   Appearance: Normal appearance. He is not toxic-appearing. HENT:    Head: Normocephalic.    Right Ear: External ear normal.    Left Ear: External ear normal. Eyes:    Extraocular Movements: Extraocular movements intact.    Conjunctiva/sclera: Conjunctivae normal. Cardiovascular:    Rate and Rhythm: Regular rhythm. Bradycardia present. Pulmonary:    Effort: Pulmonary effort is normal.    Breath sounds: Normal breath sounds. Abdominal:    General: Bowel sounds are normal.    Palpations: Abdomen is soft.    Tenderness: There is no abdominal tenderness. Musculoskeletal:    Right upper arm: Normal.    Left upper arm: Normal.    Right elbow: Normal.    Left elbow: Swelling and laceration present. No deformity or effusion. Tenderness present in olecranon process.      Arms:   Cervical back: No rigidity.    Right lower leg: No edema.    Left lower leg: No edema.    Comments: Wound is clean and dry but the surrounding area is red and hot and tender with mild edema. Skin:   General: Skin is warm and dry. Neurological:    General: No focal deficit present.    Mental Status: He is alert and oriented to person, place, and  time. Psychiatric:       Mood and Affect: Mood normal.       Behavior: Behavior normal.        ProceduresAttestation/Critical CareComments as of 02/18/24 1423 Mon Feb 18, 2024 1417 The patient's inflammatory markers are elevated, consistent with infection.  I am referring him to the [facility] for follow-up care and initiating oral antibiotics.  Testing here does not indicate sepsis, no indication for hospitalization at this time. [GB]  Comments User Index[GB] Winfred Leeds., MD   Clinical Impressions as of 02/18/24 1423 Acute post-traumatic wound infection, initial encounter  Left Forearm x-ray by my read shows no acute disease.ED DispositionDischarge       Complete blood count has been ordered to evaluate for abnormalities in any of the blood line, including anemia, thrombocytosis or thrombocytopenia, bandemia, leukocytosis; such abnormalities may indicate infection/sepsis and/or bleeding.  Chemistries have been ordered to look for renal dysfunction, dehydration, and/or electrolyte abnormality.  [LFTs will help [place] determine hepatocyte status and biliary function.][Lactic acid has been obtained to evaluate for ischemia and infection/sepsis.]I have independently reviewed all test results that are available to me at this moment in detail and [unless otherwise noted, they have not revealed any emergent pathology at this time.]I suspect he does have a wound infection.  He does not have trouble arranging the elbow, so I do not think it involves the joint itself.  There is no redness or swelling above or below the wound, so I do not think this is DVT, especially considering that he is also on anticoagulants.  It is worthwhile to check his labs to make sure he does not septic.The patient was counseled in layman's terms regarding my clinical impression, diagnosis, and plan, including their follow-up care. In shared decision making whenever possible, in light of the patient's medical and surgical history (see HPI) and my Clinical Impression (see above) of today's encounter, both hospitalization and further observation and treatment in the emergency department were considered, by myself and the patient/decision maker, but not felt to be required at this moment in time.Based on the information we have available to [place] right now, the patient agrees that there does not appear to be an emergency medical condition at this time that requires hospital admission, so the patient agreed to call [wound care] and the primary care physician immediately for follow up appointments. The patient was given the opportunity to ask questions and all questions were answered by myself and the nursing staff, then verbally expressed understanding of the written discharge instructions before signing them, including some of the reasons to return to the Emergency Department that the nurse and I have highlighted for them. I have reviewed the patient's prescription(s) and modified them as needed and appropriate.[I have provided the patient with prescription medication(s) that I feel are appropriate.  Please see the electronic medical record and/or the patient's printed discharge materials for further details.] Winfred Leeds., MD03/24/25 1424

## 2024-02-18 NOTE — ED Notes
 11:16 AM Patient presents to emergency department with complaints of worsening left elbow pain. Fell 2 days ago and had 1 suture place. Son at bedside changed bandage yesterday and today has worsening pain. Declining blood work. Provider evaluation in process.

## 2024-02-18 NOTE — ED Notes
 1:42 PM;Patient agreed to blood work, PIV access established.. Patient medicated w/ tylenol for pain, tolerated well. 2:49 ZO:[id number] handed patient discharge paperwork. Patient verbalized understanding of discharge instructions. PIV access removed, catheter intact, and no longer indicated. Strong steady gait upon departure w/ vitals up to date.

## 2024-02-18 NOTE — ED Notes
 11:02 AM Refused PIT orders, request to speak with MD first,. Patient transferred to RM 11

## 2024-02-26 ENCOUNTER — Inpatient Hospital Stay: Admit: 2024-02-26 | Discharge: 2024-02-26 | Attending: Emergency Medicine

## 2024-02-26 NOTE — Discharge Instructions
 In the medical field, there is always a level of diagnostic uncertainty, even if this uncertainty is low. For this reason, it is important to immediately return to the emergency department if you have any new symptoms, worsening symptoms, change of symptoms, or if you have any other concerns. We would be happy to re-evaluate you. Otherwise, please take your medications as prescribed and follow-up as recommended. Please take this paperwork to your primary care provider to further discuss any non-emergent lab and imaging findings.

## 2024-02-26 NOTE — ED Provider Notes
 No chief complaint on file.83 year old male, recently seen in the ED on 3/22 after mechanical trip and fall, sustained laceration to his elbow that was repaired with 1 suture, presents to the ED for suture removal today.  Did have a subsequent ED visit on 03/24 where there was concern for left upper extremity infection around the suture site for which he was prescribed antibiotics and reports that he has completed antibiotics with good effect.  Currently denies any complaints.  States he is feeling well.  Denies any infectious symptoms.  Clinically on exam, no evidence of infection at this time.  Previously noted infection (per note from 3/24: upper extremity area erythema and warmth to palpation, tenderness) has completely resolved.  On today's exam, patient has no tenderness to the extremity.  No clinical evidence of infection namely no disproportionate warmth to palpation, no tenderness, no erythema rash or other skin changes noted. Appears to be healing well. No induration or fluctuance. Full Range of motion of the elbow.  One suture is noted.  Appears ready for removal.  Wound C/D/I.  No drainage. No evidence of infection today.Suture removed by RN Sarah CorrisContinued wound care counselingRecommended close outpt follow up with PMDStrict return precautionsMDM  Physical ExamED Triage Vitals [02/26/24 1145]BP: n/aPulse: n/aPulse from  O2 sat: n/aResp: 14Temp: n/aTemp src: n/aSpO2: n/a Resp 14 Physical Exam Suture RemovalPerformed by: Corris, Sarah, RNAuthorized by: Louis Matte, MD  Location:   Location:  Upper extremity  Upper extremity location:  Elbow  Elbow location:  L elbowProcedure details:   Wound appearance:  No signs of infection  Number of sutures removed:  1Post-procedure details:   Patient tolerance of procedure:  Tolerated well, no immediate complicationsAttestation/Critical CareClinical Impressions as of 02/26/24 1522 Visit for suture removal  ED DispositionDischarge  Louis Matte, MD04/01/25 1551

## 2024-03-11 ENCOUNTER — Inpatient Hospital Stay
Admit: 2024-03-11 | Discharge: 2024-03-11 | Payer: Medicare (Managed Care) | Attending: Student in an Organized Health Care Education/Training Program

## 2024-03-11 MED ORDER — DICLOFENAC 1 % TOPICAL GEL
1 | Freq: Four times a day (QID) | TOPICAL | 1 refills | Status: AC
Start: 2024-03-11 — End: ?

## 2024-03-11 NOTE — ED Provider Notes
 Chief Complaint Patient presents with  Elbow Pain   C/o left elbow pain and swelling s/p removal of sutures. No drainage noted. Took tylenol this morning.  HPI/PE:83 year old male with PMH pacemaker on [facility] presents for left elbow pain. Patient states he gets a sharp fleeting pain when extending his left elbow in the morning. He states no fevers, difficulty ranging his elbow or any other symptoms. He states he has taken Tylenol for his pain. Patient had previously been evaluated for a laceration, suture placement and removal within the past month.Patient presents borderline hypoxemic. Full ROM of left elbow and no concern for septic arthritis. Suspect bursitis and will provide prescription for Voltaren. Patient without shortness of breath, work of breathing and no concern regarding borderline hypoxemia at this time. Provided ED return precautions and discharged to home.  Physical ExamED Triage Vitals [03/11/24 1019]BP: 126/76Pulse: 67Pulse from  O2 sat: n/aResp: 17Temp: 98.5 ?F (36.9 ?C)Temp src: n/aSpO2: (!) 93 % BP 126/76  - Pulse 67  - Temp 98.5 ?F (36.9 ?C)  - Resp 17  - Wt 65.9 kg (145 lb 4.5 oz)  - SpO2 (!) 93%  - BMI 19.17 kg/m? Physical ExamVitals reviewed. Constitutional:     Appearance: Normal appearance. HENT:    Head: Normocephalic and atraumatic.    Right Ear: External ear normal.    Left Ear: External ear normal.    Nose: Nose normal.    Mouth/Throat:    Mouth: Mucous membranes are moist. Eyes:    Extraocular Movements: Extraocular movements intact.    Conjunctiva/sclera: Conjunctivae normal.    Pupils: Pupils are equal, round, and reactive to light. Pulmonary:    Effort: Pulmonary effort is normal. No respiratory distress. Abdominal:    General: Abdomen is flat. Musculoskeletal:       General: No tenderness. Normal range of motion. Neurological:    General: No focal deficit present.    Mental Status: He is alert.  ProceduresAttestation/Critical CareClinical Impressions as of 03/11/24 1032 Bursitis, unspecified site  ED DispositionDischarge  Juanda Bond, MD04/15/25 1610

## 2024-03-11 NOTE — ED Notes
 10:22 AM Seen and evaluated by Dr Alane Hsu in triage. Carmie Chough, RN

## 2024-03-22 ENCOUNTER — Emergency Department: Admit: 2024-03-22 | Payer: Medicare (Managed Care)

## 2024-03-22 ENCOUNTER — Encounter: Admit: 2024-03-22 | Payer: PRIVATE HEALTH INSURANCE

## 2024-03-22 ENCOUNTER — Emergency Department: Admit: 2024-03-22 | Payer: Medicare (Managed Care) | Attending: Diagnostic Radiology

## 2024-03-22 ENCOUNTER — Inpatient Hospital Stay: Admit: 2024-03-22 | Discharge: 2024-03-22 | Payer: Medicare (Managed Care) | Attending: Emergency Medicine

## 2024-03-22 DIAGNOSIS — R42 Dizziness and giddiness: Secondary | ICD-10-CM

## 2024-03-22 DIAGNOSIS — Z886 Allergy status to analgesic agent status: Secondary | ICD-10-CM

## 2024-03-22 DIAGNOSIS — C169 Malignant neoplasm of stomach, unspecified: Secondary | ICD-10-CM

## 2024-03-22 DIAGNOSIS — I483 Typical atrial flutter: Secondary | ICD-10-CM

## 2024-03-22 DIAGNOSIS — D649 Anemia, unspecified: Secondary | ICD-10-CM

## 2024-03-22 DIAGNOSIS — R0602 Shortness of breath: Secondary | ICD-10-CM

## 2024-03-22 DIAGNOSIS — C801 Malignant (primary) neoplasm, unspecified: Secondary | ICD-10-CM

## 2024-03-22 DIAGNOSIS — C439 Malignant melanoma of skin, unspecified: Secondary | ICD-10-CM

## 2024-03-22 DIAGNOSIS — I951 Orthostatic hypotension: Secondary | ICD-10-CM

## 2024-03-22 DIAGNOSIS — N400 Enlarged prostate without lower urinary tract symptoms: Secondary | ICD-10-CM

## 2024-03-22 LAB — TROPONIN T HIGH SENSITIVITY, 1 HOUR WITH REFLEX (BH GH LMW YH)
BKR TROPONIN T HS 1 HOUR DELTA FROM 0 HOUR: -2 ng/L
BKR TROPONIN T HS 1 HOUR: 30 ng/L — ABNORMAL HIGH

## 2024-03-22 LAB — CBC WITH AUTO DIFFERENTIAL
BKR WAM ABSOLUTE IMMATURE GRANULOCYTES.: 0.01 x 1000/ÂµL (ref 0.00–0.30)
BKR WAM ABSOLUTE LYMPHOCYTE COUNT.: 1.29 x 1000/ÂµL (ref 0.60–3.70)
BKR WAM ABSOLUTE NRBC (2 DEC): 0 x 1000/ÂµL (ref 0.00–1.00)
BKR WAM ANC (ABSOLUTE NEUTROPHIL COUNT): 2.85 x 1000/ÂµL (ref 2.00–7.60)
BKR WAM BASOPHIL ABSOLUTE COUNT.: 0.02 x 1000/ÂµL (ref 0.00–1.00)
BKR WAM BASOPHILS: 0.4 % (ref 0.0–1.4)
BKR WAM EOSINOPHIL ABSOLUTE COUNT.: 0.07 x 1000/ÂµL (ref 0.00–1.00)
BKR WAM EOSINOPHILS: 1.4 % (ref 0.0–5.0)
BKR WAM HEMATOCRIT (2 DEC): 37.4 % — ABNORMAL LOW (ref 38.50–50.00)
BKR WAM HEMOGLOBIN: 11.5 g/dL — ABNORMAL LOW (ref 13.2–17.1)
BKR WAM IMMATURE GRANULOCYTES: 0.2 % (ref 0.0–1.0)
BKR WAM LYMPHOCYTES: 26.1 % (ref 17.0–50.0)
BKR WAM MCH (PG): 30.8 pg (ref 27.0–33.0)
BKR WAM MCHC: 30.7 g/dL — ABNORMAL LOW (ref 31.0–36.0)
BKR WAM MCV: 100.3 fL — ABNORMAL HIGH (ref 80.0–100.0)
BKR WAM MONOCYTE ABSOLUTE COUNT.: 0.7 x 1000/ÂµL (ref 0.00–1.00)
BKR WAM MONOCYTES: 14.2 % — ABNORMAL HIGH (ref 4.0–12.0)
BKR WAM MPV: 10.4 fL (ref 8.0–12.0)
BKR WAM NEUTROPHILS: 57.7 % (ref 39.0–72.0)
BKR WAM NUCLEATED RED BLOOD CELLS: 0 % (ref 0.0–1.0)
BKR WAM PLATELETS: 205 x1000/ÂµL (ref 150–420)
BKR WAM RDW-CV: 17 % — ABNORMAL HIGH (ref 11.0–15.0)
BKR WAM RED BLOOD CELL COUNT.: 3.73 M/ÂµL — ABNORMAL LOW (ref 4.00–6.00)
BKR WAM WHITE BLOOD CELL COUNT: 4.9 x1000/ÂµL (ref 4.0–11.0)

## 2024-03-22 LAB — COMPREHENSIVE METABOLIC PANEL
BKR A/G RATIO: 0.9 — ABNORMAL LOW (ref 1.0–2.2)
BKR ALANINE AMINOTRANSFERASE (ALT): 22 U/L (ref 9–59)
BKR ALBUMIN: 3.3 g/dL — ABNORMAL LOW (ref 3.6–5.1)
BKR ALKALINE PHOSPHATASE: 114 U/L (ref 9–122)
BKR ANION GAP: 7 (ref 7–17)
BKR ASPARTATE AMINOTRANSFERASE (AST): 27 U/L (ref 10–35)
BKR AST/ALT RATIO: 1.2
BKR BILIRUBIN TOTAL: 0.2 mg/dL (ref <=1.2)
BKR BLOOD UREA NITROGEN: 28 mg/dL — ABNORMAL HIGH (ref 8–23)
BKR BUN / CREAT RATIO: 23.7 — ABNORMAL HIGH (ref 8.0–23.0)
BKR CALCIUM: 9.3 mg/dL (ref 8.8–10.2)
BKR CHLORIDE: 107 mmol/L (ref 98–107)
BKR CO2: 26 mmol/L (ref 20–30)
BKR CREATININE DELTA: 0.01
BKR CREATININE: 1.18 mg/dL (ref 0.40–1.30)
BKR EGFR, CREATININE (CKD-EPI 2021): >60 mL/min/{1.73_m2} (ref >=60)
BKR GLOBULIN: 3.8 g/dL (ref 2.0–3.9)
BKR GLUCOSE: 51 mg/dL — ABNORMAL LOW (ref 70–100)
BKR POTASSIUM: 5.1 mmol/L (ref 3.3–5.3)
BKR PROTEIN TOTAL: 7.1 g/dL (ref 5.9–8.3)
BKR SODIUM: 140 mmol/L (ref 136–144)

## 2024-03-22 LAB — TROPONIN T HIGH SENSITIVITY, 3 HOUR (BH GH LMW YH)
BKR TROPONIN T HS 1 HOUR DELTA FROM 0 HOUR ON 3HR: -2 ng/L
BKR TROPONIN T HS 3 HOUR DELTA FROM 0 HOUR: 0 ng/L
BKR TROPONIN T HS 3 HOUR: 32 ng/L — ABNORMAL HIGH

## 2024-03-22 LAB — TROPONIN T HIGH SENSITIVITY, 0 HOUR BASELINE WITH REFLEX (BH GH LMW YH): BKR TROPONIN T HS 0 HOUR BASELINE: 32 ng/L — ABNORMAL HIGH

## 2024-03-22 LAB — NT-PROBNPE: BKR B-TYPE NATRIURETIC PEPTIDE, PRO (PROBNP): 1904 pg/mL — ABNORMAL HIGH (ref <450.0)

## 2024-03-22 LAB — MAGNESIUM: BKR MAGNESIUM: 2 mg/dL (ref 1.7–2.4)

## 2024-03-22 MED ORDER — SODIUM CHLORIDE 0.9 % IV BOLUS NEW BAG (DROPS CHARGE)
0.9 | Freq: Once | INTRAVENOUS | Status: CP
Start: 2024-03-22 — End: ?
  Administered 2024-03-22: 16:00:00 0.9 mL/h via INTRAVENOUS

## 2024-03-22 NOTE — ED Notes
 3:29 PM 83 y/o male biba from home for dizziness. Pt reports he was lying down and started to feel dizzy and sob. Pt feels the sob has resolved but still feels dizzy. Pt reports being seen at [facility] on Friday and he has fluid around his heart. Pt has a pacemaker. Pt denies any cp. Pt was discharged about 1 week ago from hospital where he presented due to a fall. EKG done pt attached to cardiac monitoring. Provider at the bedside. 3:40 PM orthostatic BPs done. 3:59 PM PIV access established. Labs collected and sent. 4:47 PM Pt to X-ray. 5:02 PM Second trop collected and sent. Notified provider pt is confused from initial intake. Pt was unsure why he came to the ED. Normal saline bolus running. 7:01 PM Pt ambulated with this RN. Pt denies any dizziness or shortness of breath. 7:28 PM Care handed off to [facility] Report given.

## 2024-03-22 NOTE — Discharge Instructions
 24 to 90 Today you were seen for a brief episode of dizziness and shortness a breath.  These had resolved upon your arrival to the ER.Your ER workup was notable for some orthostatic hypotension.  Your blood pressure dropped from 124 to 98 mmhg systolic.  Your heart rate also increased from 63 beats per minute to 95 beats per minute with standing.  You were given 500 cubic centimeters of normal saline and you remained asymptomatic.Your labs were also notable for chronic anemia.  Your troponins were in the low 30s and flat. Your EKG showed chronic atrial flutter with a 4 to 1 block.Please follow up with the [facility] this coming week.  Call on Monday to schedule an appointment unless you have 1 planned for next week.  I would recommend that you bring a family member.Please return to the emergency department if your symptoms recur.  Please make sure to get up slowly and change positions slowly while taking rest.

## 2024-03-22 NOTE — ED Notes
 7:29 PM Assumed care of patient. PIV removed. Patient given discharge instructions and paper work. Patient ambulated to ED Main lobby with steady gait with family assistance.

## 2024-03-25 NOTE — ED Provider Notes
 Chief Complaint Patient presents with  Dizziness   coming from home c/o SOB and dizziness. discharged from hospital 1 week ago for similar issue MDM He presents with dizziness and shortness of breath.He experienced dizziness and shortness of breath while lying flat on his back watching TV, which occurred a couple of hours prior to the visit. The symptoms lasted for a few minutes and have since resolved. No current dizziness or shortness of breath.He mentions a recent visit to the [place] where he was informed of having palpitations and fluid below the heart, likely indicating a pericardial effusion. No current symptoms of palpitations and no leg swelling.He confirms taking his medications, including a blood thinner, regularly. He denies taking any diuretics like Lasix and reports no swelling in his legs. 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 History per patient, granddaughter, EMR.My differential diagnosis includes:  UTI, pneumonia, metabolic derangement, anemia, occult ACS, orthostasis.Based on history, exam and available information, I believe that the following diagnoses are clinically ruled out:  Stroke, TIA, meningitis, encephalitis, STEMI, aortic dissection, PE, peritonitis, pyelonephritis, sepsis.The following tests and/or interventions are not indicated:  Na.Plan:Basic labsEKGOrthostaticsUpdate:EKG shows rate controlled flutter, appears chronicLabs not acutely revealingOrthostatics by vitals and symptoms, so will give IV fluid bolusUpdate:Patient feels better after IV fluidAsymptomaticDiscussed with granddaughter who states that this is fairly common for the patientHe will follow up with the [facility] this week, and I recommended that the granddaughter accompany him to the visit to discuss today's ED visit and possible changes in medication for orthostasisCustomary return to care instructions were verbally discussed with the patient (and/or family, caregiver); they voiced understanding and agreement. All questions were answered and follow up recommendations were given.Clinical Impressions as of 03/25/24 0829 Orthostasis Typical atrial flutter (HC Code) Anemia, unspecified type  ED DispositionDischargeMichael Syrianna Schillaci Marieta Shorten, DO, FCCM, FAAEM [place], Leeann Pucker., DO04/29/25 [phone]

## 2024-12-16 ENCOUNTER — Inpatient Hospital Stay: Admit: 2024-12-16 | Discharge: 2024-12-16 | Payer: Medicare (Managed Care)

## 2024-12-16 LAB — SARS-COV-2 (COVID-19)/INFLUENZA A+B/RSV BY RT-PCR (BH GH LMW YH)
BKR INFLUENZA A: NEGATIVE
BKR INFLUENZA B: NEGATIVE
BKR RESPIRATORY SYNCYTIAL VIRUS: NEGATIVE
BKR SARS-COV-2 RNA (COVID-19) (YH): NEGATIVE

## 2024-12-16 LAB — GROUP A STREPTOCOCCUS BY PCR     (BH GH LMW YH): BKR GROUP A STREP PCR: NOT DETECTED

## 2024-12-16 MED ORDER — ACETAMINOPHEN 325 MG TABLET
325 | ORAL_TABLET | Freq: Four times a day (QID) | ORAL | 1 refills | Status: AC | PRN
Start: 2024-12-16 — End: ?

## 2024-12-16 MED ORDER — ACETAMINOPHEN 325 MG TABLET
325 | Freq: Once | ORAL | Status: CP
Start: 2024-12-16 — End: ?
  Administered 2024-12-16: 02:00:00 325 mg via ORAL

## 2024-12-16 MED ORDER — BENZOCAINE 6 MG-MENTHOL 10 MG LOZENGES
6-10 | OROMUCOSAL | 1 refills | Status: AC | PRN
Start: 2024-12-16 — End: ?

## 2024-12-16 NOTE — Discharge Instructions [18]
 Please use Chloraseptic throat lozenges and Tylenol  for throat pain.  We will call you with the results of your swab if anything is positive, we are testing for COVID, flu, RSV, and strep throat.Any new or worsening symptoms like trouble breathing, chest pain, feeling dizzy, passing out, or high fever then please come back to the emergency department for further care.

## 2024-12-16 NOTE — ED Provider Notes [19]
 Chief Complaint Patient presents with  Sore Throat   BIBA from home sore throat since lalast nght, reports shortness of breath. Lungs clear on ascultation History of Present IllnessArthur [name] is an 84 year old male who presents with a sore throat and runny nose for three days.He has had a sore throat and runny nose for three days with painful swallowing. He has not used any pain medication and seeks relief of his symptoms. He denies fever, cough, or breathing difficulties. He lives alone and denies recent contact with sick individuals. Physical ExamED Triage Vitals [12/15/24 1913]BP: (!) 162/84Pulse: (!) 94Pulse from  O2 sat: n/aResp: 18Temp: 98.4 ?F (36.9 ?C)Temp src: OralSpO2: 95 % BP (!) 162/84  - Pulse (!) 94  - Temp 98.4 ?F (36.9 ?C) (Oral)  - Resp 18  - SpO2 95% Physical ExamConstitutional:     Appearance: Normal appearance. HENT:    Head: Normocephalic and atraumatic. Eyes:    Pupils: Pupils are equal, round, and reactive to light. Cardiovascular:    Rate and Rhythm: Normal rate and regular rhythm.    Pulses: Normal pulses.    Heart sounds: No murmur heard.Pulmonary:    Effort: Pulmonary effort is normal.    Breath sounds: Normal breath sounds. Abdominal:    General: Abdomen is flat. Musculoskeletal:       General: Normal range of motion. Skin:   General: Skin is warm and dry.    Capillary Refill: Capillary refill takes less than 2 seconds. Neurological:    General: No focal deficit present.    Mental Status: He is alert and oriented to person, place, and time. Psychiatric:       Mood and Affect: Mood normal. Medical Decision MakingAn 84 year old male presented with three days of sore throat and rhinorrhea, without fever, cough, dyspnea, or gastrointestinal symptoms. He denied difficulty breathing and reported no recent sick contacts. Past medical history is notable for coronary artery bypass grafting, but no history of pulmonary edema. Examination focused on the oropharynx and respiratory status, both of which were unremarkable for acute distress.Differential diagnosis includes, but is not limited to:- Viral pharyngitis: Viral pharyngitis is likely given the subacute onset of sore throat and rhinorrhea without fever or cough, and is supported by the absence of significant systemic symptoms.- COVID-19 infection: COVID-19 infection is considered due to the presence of sore throat and rhinorrhea, and a swab was sent for confirmation.- Influenza: Influenza is considered given the upper respiratory symptoms, and a swab was sent for confirmation.- Streptococcal pharyngitis: Streptococcal pharyngitis is considered due to the sore throat, and a swab was sent for confirmation, though the absence of fever and cough makes it less likely.Acute pharyngitis- Administered analgesics for pharyngeal pain.- Ordered swabs for strep, COVID-19, and influenza.- Planned to notify patient if swab results are abnormal.- Facilitated prompt discharge.- CXR offered patient declined. ProceduresAttestation/Critical CareClinical Impressions as of 12/16/24 0711 Sore throat  ED DispositionDischargeI provided a concise overview of the ambient note generation solution. [name] or their legally authorized representative verbally consented to a temporary audio recording of their visit to assist with completing the visit documentation using an AI-powered solution. This note was reviewed for accuracy by Nat Gardener who performed the clinical service. Gardener Nat, PA01/20/26 [id number]

## 2024-12-16 NOTE — ED Notes [6]
 1:22 AM 83yo male came to ED presenting with sore throat. Pt report pain started 3 days ago with pain rating pain at 5/10. Pt states it making it hard time to swallow. Pt is able to maintain patent airway, and also able to maintain his oral secretions. Denies contact with sick persons, fever or chill, n/v/d, headache. A&Ox4, airway patent  and respiration even and unlabored. Respiratory panel collected and sent to lab. Pt medicated with PO Tylenol  and well tolerated. 2:02 AM Pt discharged home and handed over discharged paper. Pt ambulatory out of ED
